# Patient Record
Sex: Male | Born: 1970 | Race: White | Hispanic: No | Marital: Married | Smoking: Former smoker
Health system: Southern US, Community
[De-identification: ages and names within clinical notes are randomized; demographics above are authoritative.]

## PROBLEM LIST (undated history)

## (undated) DIAGNOSIS — I10 Essential (primary) hypertension: Secondary | ICD-10-CM

## (undated) DIAGNOSIS — N209 Urinary calculus, unspecified: Secondary | ICD-10-CM

## (undated) DIAGNOSIS — D500 Iron deficiency anemia secondary to blood loss (chronic): Secondary | ICD-10-CM

## (undated) DIAGNOSIS — I2510 Atherosclerotic heart disease of native coronary artery without angina pectoris: Secondary | ICD-10-CM

## (undated) DIAGNOSIS — R030 Elevated blood-pressure reading, without diagnosis of hypertension: Secondary | ICD-10-CM

## (undated) DIAGNOSIS — K219 Gastro-esophageal reflux disease without esophagitis: Secondary | ICD-10-CM

## (undated) DIAGNOSIS — IMO0001 Reserved for inherently not codable concepts without codable children: Secondary | ICD-10-CM

## (undated) DIAGNOSIS — E782 Mixed hyperlipidemia: Secondary | ICD-10-CM

## (undated) HISTORY — DX: Atherosclerotic heart disease of native coronary artery without angina pectoris: I25.10

## (undated) HISTORY — DX: Iron deficiency anemia secondary to blood loss (chronic): D50.0

## (undated) HISTORY — DX: Elevated blood-pressure reading, without diagnosis of hypertension: R03.0

## (undated) HISTORY — DX: Reserved for inherently not codable concepts without codable children: IMO0001

## (undated) HISTORY — DX: Essential (primary) hypertension: I10

## (undated) HISTORY — DX: Urinary calculus, unspecified: N20.9

## (undated) HISTORY — DX: Gastro-esophageal reflux disease without esophagitis: K21.9

## (undated) HISTORY — DX: Mixed hyperlipidemia: E78.2

---

## 2007-02-11 ENCOUNTER — Encounter: Payer: Self-pay | Admitting: Internal Medicine

## 2007-02-11 DIAGNOSIS — K219 Gastro-esophageal reflux disease without esophagitis: Secondary | ICD-10-CM | POA: Insufficient documentation

## 2007-02-25 ENCOUNTER — Ambulatory Visit: Payer: Self-pay | Admitting: Internal Medicine

## 2007-02-25 DIAGNOSIS — E669 Obesity, unspecified: Secondary | ICD-10-CM | POA: Insufficient documentation

## 2007-02-25 DIAGNOSIS — I10 Essential (primary) hypertension: Secondary | ICD-10-CM | POA: Insufficient documentation

## 2007-02-25 LAB — CONVERTED CEMR LAB
Bilirubin Urine: NEGATIVE
Specific Gravity, Urine: 1.02
Urobilinogen, UA: 0.2
pH: 5

## 2007-02-26 LAB — CONVERTED CEMR LAB
Albumin: 4.4 g/dL (ref 3.5–5.2)
Albumin: 4.4 g/dL (ref 3.5–5.2)
Alkaline Phosphatase: 86 units/L (ref 39–117)
BUN: 11 mg/dL (ref 6–23)
Basophils Absolute: 0.1 10*3/uL (ref 0.0–0.1)
Basophils Absolute: 0.1 10*3/uL (ref 0.0–0.1)
Bilirubin, Direct: 0.1 mg/dL (ref 0.0–0.3)
Chloride: 100 meq/L (ref 96–112)
Cholesterol: 183 mg/dL (ref 0–200)
Cholesterol: 183 mg/dL (ref 0–200)
Direct LDL: 114.5 mg/dL
Direct LDL: 114.5 mg/dL
Eosinophils Absolute: 0.4 10*3/uL (ref 0.0–0.6)
GFR calc Af Amer: 140 mL/min
GFR calc Af Amer: 140 mL/min
GFR calc non Af Amer: 116 mL/min
Glucose, Bld: 278 mg/dL — ABNORMAL HIGH (ref 70–99)
HCT: 43.6 % (ref 39.0–52.0)
HDL: 29.2 mg/dL — ABNORMAL LOW (ref >39.0)
Hemoglobin: 15.4 g/dL (ref 13.0–17.0)
Hgb A1c MFr Bld: 11.4 % — ABNORMAL HIGH (ref 4.6–6.0)
Lymphocytes Relative: 33.5 % (ref 12.0–46.0)
Lymphocytes Relative: 33.5 % (ref 12.0–46.0)
MCHC: 35.2 g/dL (ref 30.0–36.0)
MCHC: 35.2 g/dL (ref 30.0–36.0)
MCV: 84.3 fL (ref 78.0–100.0)
MCV: 84.3 fL (ref 78.0–100.0)
Monocytes Absolute: 0.6 10*3/uL (ref 0.2–0.7)
Monocytes Absolute: 0.6 10*3/uL (ref 0.2–0.7)
Monocytes Relative: 7.4 % (ref 3.0–11.0)
Neutro Abs: 4.6 10*3/uL (ref 1.4–7.7)
Neutro Abs: 4.6 10*3/uL (ref 1.4–7.7)
Neutrophils Relative %: 54 % (ref 43.0–77.0)
Potassium: 4.2 meq/L (ref 3.5–5.1)
Potassium: 4.2 meq/L (ref 3.5–5.1)
RBC: 5.17 M/uL (ref 4.22–5.81)
Sodium: 138 meq/L (ref 135–145)
TSH: 2.06 microintl units/mL (ref 0.35–5.50)
TSH: 2.06 microintl units/mL (ref 0.35–5.50)
Total CHOL/HDL Ratio: 6.3
Total Protein: 7.6 g/dL (ref 6.0–8.3)

## 2007-03-03 ENCOUNTER — Encounter: Payer: Self-pay | Admitting: Internal Medicine

## 2007-03-23 ENCOUNTER — Ambulatory Visit: Payer: Self-pay | Admitting: Internal Medicine

## 2007-03-23 DIAGNOSIS — R740 Nonspecific elevation of levels of transaminase and lactic acid dehydrogenase [LDH]: Secondary | ICD-10-CM

## 2007-03-23 DIAGNOSIS — R7401 Elevation of levels of liver transaminase levels: Secondary | ICD-10-CM | POA: Insufficient documentation

## 2007-03-23 DIAGNOSIS — E119 Type 2 diabetes mellitus without complications: Secondary | ICD-10-CM | POA: Insufficient documentation

## 2007-03-23 DIAGNOSIS — E782 Mixed hyperlipidemia: Secondary | ICD-10-CM

## 2007-03-23 HISTORY — DX: Mixed hyperlipidemia: E78.2

## 2007-03-23 LAB — CONVERTED CEMR LAB: Blood Glucose, Fingerstick: 139

## 2007-03-30 LAB — CONVERTED CEMR LAB
ALT: 74 units/L — ABNORMAL HIGH (ref 0–53)
AST: 37 units/L (ref 0–37)
Alkaline Phosphatase: 60 units/L (ref 39–117)
Bilirubin, Direct: 0.1 mg/dL (ref 0.0–0.3)
Microalb Creat Ratio: 12.9 mg/g (ref 0.0–30.0)
Total Protein: 8.2 g/dL (ref 6.0–8.3)
Transferrin: 302.6 mg/dL (ref >=212.0)

## 2007-04-06 ENCOUNTER — Telehealth: Payer: Self-pay | Admitting: Internal Medicine

## 2007-04-12 ENCOUNTER — Telehealth: Payer: Self-pay | Admitting: Internal Medicine

## 2007-04-22 ENCOUNTER — Telehealth: Payer: Self-pay | Admitting: Internal Medicine

## 2007-05-25 ENCOUNTER — Ambulatory Visit: Payer: Self-pay | Admitting: Internal Medicine

## 2007-05-25 LAB — CONVERTED CEMR LAB
ALT: 36 units/L (ref 0–53)
Alkaline Phosphatase: 68 units/L (ref 39–117)
Bilirubin, Direct: 0.1 mg/dL (ref 0.0–0.3)
Cholesterol: 111 mg/dL (ref 0–200)
HDL: 25.8 mg/dL — ABNORMAL LOW (ref >39.0)
LDL Cholesterol: 68 mg/dL (ref 0–99)
Total Bilirubin: 0.6 mg/dL (ref 0.3–1.2)
Total CHOL/HDL Ratio: 4.3
Total Protein: 7.3 g/dL (ref 6.0–8.3)

## 2007-06-02 ENCOUNTER — Ambulatory Visit: Payer: Self-pay | Admitting: Internal Medicine

## 2007-06-02 LAB — CONVERTED CEMR LAB
Cholesterol, target level: 200 mg/dL
LDL Goal: 100 mg/dL

## 2007-06-10 ENCOUNTER — Ambulatory Visit: Payer: Self-pay | Admitting: Internal Medicine

## 2007-06-10 ENCOUNTER — Ambulatory Visit: Payer: Self-pay

## 2007-06-10 DIAGNOSIS — J019 Acute sinusitis, unspecified: Secondary | ICD-10-CM | POA: Insufficient documentation

## 2007-06-21 ENCOUNTER — Telehealth: Payer: Self-pay | Admitting: Internal Medicine

## 2007-06-21 DIAGNOSIS — I2510 Atherosclerotic heart disease of native coronary artery without angina pectoris: Secondary | ICD-10-CM

## 2007-06-21 HISTORY — DX: Atherosclerotic heart disease of native coronary artery without angina pectoris: I25.10

## 2007-06-28 ENCOUNTER — Encounter: Payer: Self-pay | Admitting: Internal Medicine

## 2007-06-28 ENCOUNTER — Ambulatory Visit: Payer: Self-pay

## 2007-06-29 ENCOUNTER — Telehealth: Payer: Self-pay | Admitting: Internal Medicine

## 2007-08-04 ENCOUNTER — Ambulatory Visit: Payer: Self-pay | Admitting: Internal Medicine

## 2007-08-04 DIAGNOSIS — R071 Chest pain on breathing: Secondary | ICD-10-CM | POA: Insufficient documentation

## 2007-08-04 LAB — CONVERTED CEMR LAB
Blood in Urine, dipstick: NEGATIVE
Glucose, Urine, Semiquant: NEGATIVE
Ketones, urine, test strip: NEGATIVE
Nitrite: NEGATIVE
Protein, U semiquant: NEGATIVE

## 2007-08-20 ENCOUNTER — Telehealth: Payer: Self-pay | Admitting: Internal Medicine

## 2007-10-11 ENCOUNTER — Ambulatory Visit: Payer: Self-pay | Admitting: Internal Medicine

## 2007-10-11 LAB — CONVERTED CEMR LAB: Hgb A1c MFr Bld: 6.5 % — ABNORMAL HIGH (ref 4.6–6.0)

## 2007-10-18 ENCOUNTER — Ambulatory Visit: Payer: Self-pay | Admitting: Internal Medicine

## 2007-10-18 DIAGNOSIS — M546 Pain in thoracic spine: Secondary | ICD-10-CM | POA: Insufficient documentation

## 2007-10-18 LAB — CONVERTED CEMR LAB: Blood Glucose, Fingerstick: 177

## 2008-02-10 ENCOUNTER — Ambulatory Visit: Payer: Self-pay | Admitting: Internal Medicine

## 2008-02-17 ENCOUNTER — Ambulatory Visit: Payer: Self-pay | Admitting: Internal Medicine

## 2008-03-07 ENCOUNTER — Telehealth: Payer: Self-pay | Admitting: *Deleted

## 2008-03-27 ENCOUNTER — Telehealth: Payer: Self-pay | Admitting: Internal Medicine

## 2008-04-21 ENCOUNTER — Ambulatory Visit: Payer: Self-pay | Admitting: Internal Medicine

## 2008-05-01 ENCOUNTER — Ambulatory Visit: Payer: Self-pay | Admitting: Internal Medicine

## 2008-05-24 ENCOUNTER — Telehealth: Payer: Self-pay | Admitting: *Deleted

## 2008-06-14 ENCOUNTER — Encounter: Payer: Self-pay | Admitting: Endocrinology

## 2008-06-17 ENCOUNTER — Ambulatory Visit: Payer: Self-pay | Admitting: Family Medicine

## 2008-06-17 DIAGNOSIS — J1089 Influenza due to other identified influenza virus with other manifestations: Secondary | ICD-10-CM | POA: Insufficient documentation

## 2008-06-17 LAB — CONVERTED CEMR LAB
Inflenza A Ag: NEGATIVE
Influenza B Ag: NEGATIVE

## 2008-08-02 ENCOUNTER — Encounter: Payer: Self-pay | Admitting: Endocrinology

## 2008-08-14 ENCOUNTER — Encounter: Payer: Self-pay | Admitting: Endocrinology

## 2008-12-01 ENCOUNTER — Encounter: Payer: Self-pay | Admitting: Endocrinology

## 2008-12-08 ENCOUNTER — Telehealth: Payer: Self-pay | Admitting: *Deleted

## 2009-02-05 ENCOUNTER — Ambulatory Visit: Payer: Self-pay | Admitting: Endocrinology

## 2009-02-05 ENCOUNTER — Telehealth: Payer: Self-pay | Admitting: Endocrinology

## 2009-02-05 DIAGNOSIS — N209 Urinary calculus, unspecified: Secondary | ICD-10-CM

## 2009-02-05 HISTORY — DX: Urinary calculus, unspecified: N20.9

## 2009-02-28 ENCOUNTER — Encounter: Payer: Self-pay | Admitting: Endocrinology

## 2009-03-08 ENCOUNTER — Ambulatory Visit: Payer: Self-pay | Admitting: Endocrinology

## 2009-03-09 ENCOUNTER — Telehealth (INDEPENDENT_AMBULATORY_CARE_PROVIDER_SITE_OTHER): Payer: Self-pay | Admitting: *Deleted

## 2009-03-10 LAB — CONVERTED CEMR LAB
Cholesterol: 128 mg/dL (ref 0–200)
Hgb A1c MFr Bld: 6.7 % — ABNORMAL HIGH (ref 4.6–6.5)

## 2009-03-23 ENCOUNTER — Ambulatory Visit: Payer: Self-pay | Admitting: Internal Medicine

## 2009-03-23 DIAGNOSIS — J039 Acute tonsillitis, unspecified: Secondary | ICD-10-CM | POA: Insufficient documentation

## 2009-03-23 LAB — CONVERTED CEMR LAB: Rapid Strep: NEGATIVE

## 2009-03-24 ENCOUNTER — Encounter: Payer: Self-pay | Admitting: Internal Medicine

## 2009-07-03 ENCOUNTER — Ambulatory Visit: Payer: Self-pay | Admitting: Endocrinology

## 2009-07-04 LAB — CONVERTED CEMR LAB: Hgb A1c MFr Bld: 6.7 % — ABNORMAL HIGH (ref 4.6–6.5)

## 2009-07-10 ENCOUNTER — Ambulatory Visit: Payer: Self-pay | Admitting: Endocrinology

## 2009-07-10 ENCOUNTER — Telehealth: Payer: Self-pay | Admitting: Endocrinology

## 2009-09-10 ENCOUNTER — Ambulatory Visit: Payer: Self-pay | Admitting: Endocrinology

## 2009-09-10 DIAGNOSIS — F911 Conduct disorder, childhood-onset type: Secondary | ICD-10-CM | POA: Insufficient documentation

## 2009-09-10 DIAGNOSIS — R635 Abnormal weight gain: Secondary | ICD-10-CM | POA: Insufficient documentation

## 2009-09-10 DIAGNOSIS — D500 Iron deficiency anemia secondary to blood loss (chronic): Secondary | ICD-10-CM

## 2009-09-10 HISTORY — DX: Iron deficiency anemia secondary to blood loss (chronic): D50.0

## 2009-09-11 LAB — CONVERTED CEMR LAB: TSH: 1.58 microintl units/mL (ref 0.35–5.50)

## 2009-09-25 ENCOUNTER — Ambulatory Visit: Payer: Self-pay | Admitting: Internal Medicine

## 2009-09-25 LAB — CONVERTED CEMR LAB
Bilirubin Urine: NEGATIVE
Glucose, Urine, Semiquant: NEGATIVE
Specific Gravity, Urine: <1.005
WBC Urine, dipstick: NEGATIVE
pH: 5.5

## 2010-01-08 ENCOUNTER — Ambulatory Visit: Payer: Self-pay | Admitting: Endocrinology

## 2010-01-15 ENCOUNTER — Ambulatory Visit: Payer: Self-pay | Admitting: Endocrinology

## 2010-05-22 ENCOUNTER — Ambulatory Visit: Payer: Self-pay | Admitting: Family Medicine

## 2010-07-02 ENCOUNTER — Ambulatory Visit: Payer: Self-pay | Admitting: Endocrinology

## 2010-07-02 LAB — CONVERTED CEMR LAB
BUN: 19 mg/dL (ref 6–23)
Calcium: 8.8 mg/dL (ref 8.4–10.5)
Chloride: 104 meq/L (ref 96–112)
Cholesterol: 144 mg/dL (ref 0–200)
Creatinine, Ser: 0.8 mg/dL (ref 0.4–1.5)
Creatinine,U: 193.5 mg/dL
HDL: 31.4 mg/dL — ABNORMAL LOW (ref >39.00)
LDL Cholesterol: 98 mg/dL (ref 0–99)
Triglycerides: 71 mg/dL (ref 0.0–149.0)
VLDL: 14.2 mg/dL (ref 0.0–40.0)

## 2010-07-09 ENCOUNTER — Ambulatory Visit: Payer: Self-pay | Admitting: Endocrinology

## 2010-07-09 DIAGNOSIS — R059 Cough, unspecified: Secondary | ICD-10-CM

## 2010-07-09 DIAGNOSIS — L509 Urticaria, unspecified: Secondary | ICD-10-CM | POA: Insufficient documentation

## 2010-07-09 DIAGNOSIS — R05 Cough: Secondary | ICD-10-CM

## 2010-07-09 HISTORY — DX: Cough, unspecified: R05.9

## 2010-08-20 ENCOUNTER — Telehealth (INDEPENDENT_AMBULATORY_CARE_PROVIDER_SITE_OTHER): Payer: Self-pay | Admitting: *Deleted

## 2010-08-22 ENCOUNTER — Encounter: Payer: Self-pay | Admitting: Endocrinology

## 2010-08-27 NOTE — Assessment & Plan Note (Signed)
Summary: ANGER ISSUE--MED????---STC   Vital Signs:  Patient profile:   40 year old male Height:      70 inches (177.80 cm) Weight:      255.38 pounds (116.08 kg) O2 Sat:      94 % on Room air Temp:     98.3 degrees F (36.83 degrees C) oral Pulse rate:   87 / minute BP sitting:   112 / 72  (left arm) Cuff size:   large  Vitals Entered By: Josph Macho RMA (September 10, 2009 2:03 PM)  O2 Flow:  Room air CC: Pts spouse is concerned with pts anger? pts spouse wanders if it is caused from meds?/  pt states he is no longer taking Cinnamon/CF Is Patient Diabetic? Yes   Referring Provider:  Dr Shelby Mattocks Primary Provider:  Dr Berniece Andreas  CC:  Pts spouse is concerned with pts anger? pts spouse wanders if it is caused from meds?/  pt states he is no longer taking Cinnamon/CF.  History of Present Illness: pt says he had an "anger explosion," over the weekend.  he says he has had "anger problems" since the age of 52.  he has never had a psychological evaluation.  wife says he has this from time to time, but more recently.  alcohol is < 2/month.  no illegal drugs.  no domestic violence.   he is discouraged by weight gain.  Allergies: No Known Drug Allergies  Past History:  Past Medical History: Last updated: 04/21/2008 GERD Elevated BP readings  Diabetes mellitus, type II Hypertension  Social History: Reviewed history from 04/21/2008 and no changes required. Married newborn no pets Former Smoker Alcohol use-yes  rare Firearms locked away.    does not work outside the home  Review of Systems       The patient complains of depression.         denies suicidal thoughts.    Physical Exam  General:  obese.  no distress  Psych:  Alert and cooperative; normal mood and affect; normal attention span and concentration.     Impression & Recommendations:  Problem # 1:  ANGER (ICD-312.00) Assessment New  Problem # 2:  WEIGHT GAIN (ICD-783.1) Assessment:  Deteriorated  Other Orders: Surgical Referral (Surgery) TLB-TSH (Thyroid Stimulating Hormone) (84443-TSH) Est. Patient Level III (95284)  Patient Instructions: 1)  check thyroid blood test today. 2)  please call if you wish to consider medication or psychological evaluation.   3)  you will be called re: an appointment for weight-loss surgery.

## 2010-08-27 NOTE — Assessment & Plan Note (Signed)
Summary: 6 mos f/u #/cd   Vital Signs:  Patient profile:   40 year old male Height:      69 inches (175.26 cm) Weight:      257.75 pounds (117.16 kg) BMI:     38.20 O2 Sat:      96 % on Room air Temp:     96.8 degrees F (36 degrees C) oral Pulse rate:   75 / minute Pulse rhythm:   regular BP sitting:   124 / 86  (left arm) Cuff size:   large  Vitals Entered By: Brenton Grills (January 15, 2010 8:26 AM)  O2 Flow:  Room air CC: pt here for 6 mo f/u/pt states he is taking losartan 50mg  1 tab BID/aj   Referring Provider:  Dr Shelby Mattocks Primary Provider:  Dr Berniece Andreas  CC:  pt here for 6 mo f/u/pt states he is taking losartan 50mg  1 tab BID/aj.  History of Present Illness: pt states he feels well in general, but he says his diet is poor.  no cbg record, but states cbg's are sometimes over 200.  Current Medications (verified): 1)  Onetouch Test   Strp (Glucose Blood) .... Check Blood Sugar 5 To 6 Times A Day or As Directed 2)  Adult Aspirin Ec Low Strength 81 Mg  Tbec (Aspirin) .... Once Daily 3)  Zyrtec-D Allergy & Congestion 5-120 Mg Xr12h-Tab (Cetirizine-Pseudoephedrine) .... As Needed 4)  Fish Oil 1000 Mg Caps (Omega-3 Fatty Acids) .Marland Kitchen.. 1 Tab Once A Day 5)  Actoplus Met 15-850 Mg Tabs (Pioglitazone Hcl-Metformin Hcl) .Marland Kitchen.. 1 Twice A Day 6)  Cozaar 50 Mg Tabs (Losartan Potassium) .Marland Kitchen.. 1 Bid 7)  Cinnamon 2000mg  .... 1tab in Am and 1tab in Pm  Allergies (verified): No Known Drug Allergies  Past History:  Past Medical History: Last updated: 04/21/2008 GERD Elevated BP readings  Diabetes mellitus, type II Hypertension  Social History: Reviewed history from 09/10/2009 and no changes required. Married newborn no pets Former Smoker Alcohol use-yes  rare Firearms locked away.    does not work outside the home  Review of Systems       he has gained a few lbs  Physical Exam  General:  obese.  no distress  Pulses:  dorsalis pedis intact bilat.    Extremities:   no deformity.  no ulcer on the feet.  feet are of normal color and temp.  no edema  Neurologic:  cn 2-12 grossly intact.   readily moves all 4's.   sensation is intact to touch on the feet.  Additional Exam:  Hemoglobin A1C       [H]  6.8 %   Impression & Recommendations:  Problem # 1:  DIABETES MELLITUS, TYPE II (ICD-250.00) Assessment Deteriorated  Medications Added to Medication List This Visit: 1)  Actoplus Met 15-850 Mg Tabs (Pioglitazone hcl-metformin hcl) .Marland Kitchen.. 1 tab three times a day 2)  Cozaar 50 Mg Tabs (Losartan potassium) .Marland Kitchen.. 1 two times a day  Other Orders: Est. Patient Level III ([address])  Patient Instructions: 1)  increase actos + met to 1 tab three times a day 2)  Please schedule a follow-up appointment in 6 months, with these labs prior (all 250.00):  a1c microalb, tsh, lipids, and bmet. Prescriptions: ACTOPLUS MET 15-850 MG TABS (PIOGLITAZONE HCL-METFORMIN HCL) 1 tab three times a day  #90 x 11   Entered and Authorized by:   Minus Breeding MD   Signed by:   Minus Breeding MD on  01/15/2010   Method used:   Electronically to        [facility] [id number] (retail)       [address]       [place], [place]  [address]       Ph: [phone]       Fax: [phone]   RxID:   [id number]

## 2010-08-27 NOTE — Assessment & Plan Note (Signed)
Summary: P PT REQ ANOTHER DOCTOR TODAY/SINUS/PS   Vital Signs:  Patient profile:   40 year old male Weight:      258 pounds Temp:     97.7 degrees F oral BP sitting:   130 / 90  (left arm) Cuff size:   large  Vitals Entered By: Sid Falcon LPN (May 22, 2010 3:52 PM)  History of Present Illness: Acute visit.  10 day history of sinus congestion and progressive right facial pressure maxillary and frontal sinus region. Pain radiating to right ear. Used saline nasal irrigation without improvement. Recently increased yellowish nasal discharge. Occasional dry cough. Mild sore throat. History frequent sinus infections in past  Allergies (verified): No Known Drug Allergies  Past History:  Past Medical History: Last updated: 04/21/2008 GERD Elevated BP readings  Diabetes mellitus, type II Hypertension PMH reviewed for relevance  Review of Systems      See HPI  Physical Exam  General:  Well-developed,well-nourished,in no acute distress; alert,appropriate and cooperative throughout examination Head:  tender right maxillary and frontal sinus region. No visible swelling Ears:  External ear exam shows no significant lesions or deformities.  Otoscopic examination reveals clear canals, tympanic membranes are intact bilaterally without bulging, retraction, inflammation or discharge. Hearing is grossly normal bilaterally. Nose:  swelling right naris. Mostly clear nasal discharge Mouth:  Oral mucosa and oropharynx without lesions or exudates.  Teeth in good repair. Neck:  No deformities, masses, or tenderness noted. Lungs:  Normal respiratory effort, chest expands symmetrically. Lungs are clear to auscultation, no crackles or wheezes. Heart:  Normal rate and regular rhythm. S1 and S2 normal without gallop, murmur, click, rub or other extra sounds.   Impression & Recommendations:  Problem # 1:  SINUSITIS- ACUTE-NOS (ICD-461.9)  His updated medication list for this problem includes:    Zyrtec-d Allergy & Congestion 5-120 Mg Xr12h-tab (Cetirizine-pseudoephedrine) .Marland Kitchen... As needed    Amoxicillin-pot Clavulanate 875-125 Mg Tabs (Amoxicillin-pot clavulanate) ..... One by mouth two times a day for 10 days  Complete Medication List: 1)  Onetouch Test Strp (Glucose blood) .... Check blood sugar 5 to 6 times a day or as directed 2)  Adult Aspirin Ec Low Strength 81 Mg Tbec (Aspirin) .... Once daily 3)  Zyrtec-d Allergy & Congestion 5-120 Mg Xr12h-tab (Cetirizine-pseudoephedrine) .... As needed 4)  Fish Oil 1000 Mg Caps (Omega-3 fatty acids) .Marland Kitchen.. 1 tab once a day 5)  Actoplus Met 15-850 Mg Tabs (Pioglitazone hcl-metformin hcl) .Marland Kitchen.. 1 tab three times a day 6)  Cozaar 50 Mg Tabs (Losartan potassium) .Marland Kitchen.. 1 two times a day 7)  Cinnamon 2000mg   .... 1tab in am and 1tab in pm 8)  Amoxicillin-pot Clavulanate 875-125 Mg Tabs (Amoxicillin-pot clavulanate) .... One by mouth two times a day for 10 days  Other Orders: Admin 1st Vaccine (78469) Flu Vaccine 104yrs + (62952)  Patient Instructions: 1)  Acute sinusitis symptoms for less than 10 days are not helped by antibiotics. Use warm moist compresses, and over the counter decongestants( only as directed). Call if no improvement in 5-7 days, sooner if increasing pain, fever, or new symptoms.  Prescriptions: AMOXICILLIN-POT CLAVULANATE 875-125 MG TABS (AMOXICILLIN-POT CLAVULANATE) one by mouth two times a day for 10 days  #20 x 0   Entered and Authorized by:   Evelena Peat MD   Signed by:   Evelena Peat MD on 05/22/2010   Method used:   Electronically to        [id number] (retail)  [address] [place], [place]  [address]       Ph: [phone]       Fax: [phone]   RxID:   [id number]    Orders Added: 1)  Est. Patient Level III [84132] 2)  Admin 1st Vaccine [90471] 3)  Flu Vaccine 30yrs + [44010]   Flu Vaccine Consent Questions     Do you have a history of severe allergic  reactions to this vaccine? no    Any prior history of allergic reactions to egg and/or gelatin? no    Do you have a sensitivity to the preservative Thimersol? no    Do you have a past history of Guillan-Barre Syndrome? no    Do you currently have an acute febrile illness? no    Have you ever had a severe reaction to latex? no    Vaccine information given and explained to patient? yes    Are you currently pregnant? no    Lot Number:[id number]   Exp Date:01/25/2011   Site Given  Left Deltoid [email or web address]

## 2010-08-27 NOTE — Assessment & Plan Note (Signed)
Summary: lower abd pain/njr   Vital Signs:  Patient profile:   40 year old male Height:      69 inches Weight:      258 pounds Temp:     97.7 degrees F oral Pulse rate:   60 / minute BP sitting:   120 / 80  (right arm) Cuff size:   large  Vitals Entered By: Romualdo Bolk, CMA (AAMA) (September 25, 2009 9:42 AM) CC: LLQ Pain and feels like he has to have a BM constantly. This started on Sat or sun. Pt pulled the old hot water heater out on Sat and put it a new one in on Sun. Pt is unsure it this has something to do with it. No fever.   History of Present Illness: Alfred Johnson comesin for sda    replaced  hot water heater  around onset.  insidious onset    felt ? if contipated   Some increase in gas.  hurts 5/10/ no meds  no change in bowel habit Gu signs and not really worse with straining .  No fever or anorexia  vomiting or nausea. No hematuria and no hx of renal stone.   ? if could be a pulled muscle.   Dm  sees Dr Everardo All consider ing bariatric evaluation.  Preventive Screening-Counseling & Management  Alcohol-Tobacco     Alcohol drinks/day: <1     Alcohol type: Beer     Smoking Status: quit     Year Quit: 14 years ago  Caffeine-Diet-Exercise     Caffeine use/day: 1     Does Patient Exercise: no  Current Medications (verified): 1)  Onetouch Test   Strp (Glucose Blood) .... Check Blood Sugar 5 To 6 Times A Day or As Directed 2)  Adult Aspirin Ec Low Strength 81 Mg  Tbec (Aspirin) .... Once Daily 3)  Zyrtec-D Allergy & Congestion 5-120 Mg Xr12h-Tab (Cetirizine-Pseudoephedrine) .... As Needed 4)  Fish Oil 1000 Mg Caps (Omega-3 Fatty Acids) .Marland Kitchen.. 1 Tab Once A Day 5)  Actoplus Met 15-850 Mg Tabs (Pioglitazone Hcl-Metformin Hcl) .Marland Kitchen.. 1 Twice A Day 6)  Cozaar 50 Mg Tabs (Losartan Potassium) .Marland Kitchen.. 1 Bid 7)  Cinnamon 2000mg  .... 1tab in Am and 1tab in Pm  Allergies (verified): No Known Drug Allergies  Past History:  Past medical, surgical, family and social histories  (including risk factors) reviewed, and no changes noted (except as noted below).  Past Medical History: Reviewed history from 04/21/2008 and no changes required. GERD Elevated BP readings  Diabetes mellitus, type II Hypertension  Past Surgical History: Reviewed history from 02/11/2007 and no changes required. Denies surgical history  Past History:  Care Management: Endocrinology: Everardo All  Family History: Reviewed history from 02/17/2008 and no changes required. Family History of CAD Male 1st degree relative <50 Family History Diabetes 1st degree relative bro HIV related Family History Hypertension  See data base    Social History: Reviewed history from 09/10/2009 and no changes required. Married newborn no pets Former Smoker Alcohol use-yes  rare Firearms locked away.    does not work outside the home  Review of Systems  The patient denies anorexia, fever, weight loss, weight gain, vision loss, decreased hearing, chest pain, syncope, prolonged cough, melena, hematochezia, severe indigestion/heartburn, hematuria, incontinence, genital sores, abnormal bleeding, enlarged lymph nodes, and angioedema.    Physical Exam  General:  Well-developed,well-nourished,in no acute distress; alert,appropriate and cooperative throughout examination Head:  normocephalic and atraumatic.   Eyes:  vision grossly  intact.   Neck:  No deformities, masses, or tenderness noted. Lungs:  normal respiratory effort and no intercostal retractions.   Heart:  Normal rate and regular rhythm. S1 and S2 normal without gallop, murmur, click, rub or other extra sounds. Abdomen:  soft, normal bowel sounds, no distention, no masses, no guarding, no rigidity, no rebound tenderness, no abdominal hernia, no hepatomegaly, and no splenomegaly.  points to mid lower lateral left area as tender area . examined supine and upright  Genitalia:  nl ext gu no hernia noted  Extremities:  no clubbing cyanosis or edema    Neurologic:  gorssly non focal  Skin:  turgor normal, color normal, no ecchymoses, and no petechiae.   Cervical Nodes:  No lymphadenopathy noted Inguinal Nodes:  No significant adenopathy Psych:  Oriented X3, good eye contact, and not depressed appearing.     Impression & Recommendations:  Problem # 1:  ABDOMINAL PAIN LEFT (ICD-789.09)  unclear cause  unimpressive exam and no associated signs except ocurred after lifting.     disc alarm signs and follow up if persistent and progressive  over the next 2 weeks or if fever etc.     can take tylenol  nsaid with care    because of dm dx.   doesnt seem like renal stone pain and radiation  .   No imaging indicated at this time.  His updated medication list for this problem includes:    Adult Aspirin Ec Low Strength 81 Mg Tbec (Aspirin) ..... Once daily  Orders: UA Dipstick w/o Micro (automated)  (81003)  Problem # 2:  DIABETES MELLITUS, TYPE II (ICD-250.00) per dr Bea Laura.  His updated medication list for this problem includes:    Adult Aspirin Ec Low Strength 81 Mg Tbec (Aspirin) ..... Once daily    Actoplus Met 15-850 Mg Tabs (Pioglitazone hcl-metformin hcl) .Marland Kitchen... 1 twice a day    Cozaar 50 Mg Tabs (Losartan potassium) .Marland Kitchen... 1 bid  Problem # 3:  WEIGHT GAIN (ICD-783.1) disc   Complete Medication List: 1)  Onetouch Test Strp (Glucose blood) .... Check blood sugar 5 to 6 times a day or as directed 2)  Adult Aspirin Ec Low Strength 81 Mg Tbec (Aspirin) .... Once daily 3)  Zyrtec-d Allergy & Congestion 5-120 Mg Xr12h-tab (Cetirizine-pseudoephedrine) .... As needed 4)  Fish Oil 1000 Mg Caps (Omega-3 fatty acids) .Marland Kitchen.. 1 tab once a day 5)  Actoplus Met 15-850 Mg Tabs (Pioglitazone hcl-metformin hcl) .Marland Kitchen.. 1 twice a day 6)  Cozaar 50 Mg Tabs (Losartan potassium) .Marland Kitchen.. 1 bid 7)  Cinnamon 2000mg   .... 1tab in am and 1tab in pm  Laboratory Results   Urine Tests  Date/Time Received: September 25, 2009 10:27 AM   Routine Urinalysis   Color:  yellow Appearance: Clear Glucose: negative   (Normal Range: Negative) Bilirubin: negative   (Normal Range: Negative) Ketone: negative   (Normal Range: Negative) Spec. Gravity: <1.005   (Normal Range: 1.003-1.035) Blood: negative   (Normal Range: Negative) pH: 5.5   (Normal Range: 5.0-8.0) Protein: negative   (Normal Range: Negative) Urobilinogen: 0.2   (Normal Range: 0-1) Nitrite: negative   (Normal Range: Negative) Leukocyte Esterace: negative   (Normal Range: Negative)    Comments: Romualdo Bolk, CMA (AAMA)  September 25, 2009 10:27 AM

## 2010-08-29 NOTE — Assessment & Plan Note (Signed)
Summary: 6 mth fu--stc   Vital Signs:  Patient profile:   40 year old male Height:      69 inches (175.26 cm) Weight:      261.25 pounds (118.75 kg) BMI:     38.72 O2 Sat:      95 % on Room air Temp:     98.8 degrees F (37.11 degrees C) oral Pulse rate:   96 / minute BP sitting:   118 / 70  (left arm) Cuff size:   large  Vitals Entered By: Brenton Grills CMA Duncan Dull) (July 09, 2010 9:44 AM)  O2 Flow:  Room air CC: 6 month F/U/discuss Losartan/aj Is Patient Diabetic? Yes   Referring Provider:  Dr Shelby Mattocks Primary Provider:  Dr Berniece Andreas  CC:  6 month F/U/discuss Losartan/aj.  History of Present Illness: pt was worried that a1c would be higher, because diet has not been good recently. pt states 5 mos of slight dry-quality cough in the chest, but no assoc sob.  he has had heartburn in the past.     Current Medications (verified): 1)  Onetouch Test   Strp (Glucose Blood) .... Check Blood Sugar 5 To 6 Times A Day or As Directed 2)  Adult Aspirin Ec Low Strength 81 Mg  Tbec (Aspirin) .... Once Daily 3)  Zyrtec-D Allergy & Congestion 5-120 Mg Xr12h-Tab (Cetirizine-Pseudoephedrine) .Marland Kitchen.. 1 By Mouth Two Times A Day 4)  Fish Oil 1000 Mg Caps (Omega-3 Fatty Acids) .Marland Kitchen.. 1 Tab Once A Day 5)  Actoplus Met 15-850 Mg Tabs (Pioglitazone Hcl-Metformin Hcl) .Marland Kitchen.. 1 Tab Three Times A Day 6)  Cozaar 50 Mg Tabs (Losartan Potassium) .Marland Kitchen.. 1 Two Times A Day 7)  Cinnamon 2000mg  .... 1tab in Am and 1tab in Pm 8)  Tagamet Hb 200 Mg Tabs (Cimetidine) .... 2 Tablets By Mouth Once Daily 9)  Hydroxyzine Hcl 10 Mg Tabs (Hydroxyzine Hcl) .Marland Kitchen.. 1 Tablet By Mouth Three Times A Day  Allergies (verified): No Known Drug Allergies  Past History:  Past Medical History: Last updated: 04/21/2008 GERD Elevated BP readings  Diabetes mellitus, type II Hypertension  Review of Systems  The patient denies weight loss and weight gain.    Physical Exam  General:  normal appearance.   Head:  head: no  deformity eyes: no periorbital swelling, no proptosis external nose and ears are normal mouth: no lesion seen Ears:  TM's intact and clear with normal canals with grossly normal hearing.   Neck:  Supple without thyroid enlargement or tenderness. No cervical lymphadenopathy, neck masses or tracheal deviation.  Lungs:  Clear to auscultation bilaterally. Normal respiratory effort.  Additional Exam:  CHEST - 2 VIEW There is no evidence of acute cardiac or pulmonary process.   Impression & Recommendations:  Problem # 1:  DIABETES MELLITUS, TYPE II (ICD-250.00) well-controlled HgbA1C: 6.8 (07/02/2010)  Problem # 2:  COUGH (ICD-786.2) Assessment: New prob due to uri  Medications Added to Medication List This Visit: 1)  Zyrtec-d Allergy & Congestion 5-120 Mg Xr12h-tab (Cetirizine-pseudoephedrine) .Marland Kitchen.. 1 by mouth two times a day 2)  Tagamet Hb 200 Mg Tabs (Cimetidine) .... 2 tablets by mouth once daily 3)  Hydroxyzine Hcl 10 Mg Tabs (Hydroxyzine hcl) .Marland Kitchen.. 1 tablet by mouth three times a day 4)  Januvia 100 Mg Tabs (Sitagliptin phosphate) .Marland Kitchen.. 1 tab each am  Other Orders: T-2 View CXR (71020TC) Est. Patient Level IV ([address])  Patient Instructions: 1)  Please schedule a follow-up appointment in 6 months, with labs prior (  250.00):  a1c 2)  try taking these samples of "prilosec" 20 mg once daily.  call dr Fabian Sharp if this does not help).  3)  add januvia 100 mg once daily Prescriptions: JANUVIA 100 MG TABS (SITAGLIPTIN PHOSPHATE) 1 tab each am  #30 x 11   Entered and Authorized by:   Minus Breeding MD   Signed by:   Minus Breeding MD on 07/09/2010   Method used:   Electronically to        [facility] # [phone]* (retail)       [address]       [place], [place]  [address]       Ph: [phone]       Fax: [phone]   RxID:   [id number]    Orders Added: 1)  T-2 View CXR [71020TC] 2)  Est. Patient Level IV [95638]

## 2010-08-29 NOTE — Progress Notes (Signed)
Summary: PA-Januvia  Phone Note From Pharmacy   Summary of Call: Konrad Saha, called [employer] @ [phone], awaiting form. Dagoberto Reef  August 20, 2010 2:53 PM Form to Dr Everardo All to complete. Dagoberto Reef  August 20, 2010 2:59 PM Pa faxed to [facility] @ [phone], awaiting approval. Dagoberto Reef  August 22, 2010 11:58 AM PA approved 08/22/10 - 08/22/11, pt aware. Initial call taken by: Dagoberto Reef,  August 22, 2010 2:53 PM

## 2010-09-04 NOTE — Medication Information (Signed)
Summary: Approved/[facility]  Approved/[facility] [facility]   Imported By: Lester  08/28/2010 10:40:29  _____________________________________________________________________  External Attachment:    Type:   Image     Comment:   External Document

## 2010-09-16 ENCOUNTER — Ambulatory Visit (INDEPENDENT_AMBULATORY_CARE_PROVIDER_SITE_OTHER): Payer: Medicare PPO | Admitting: Internal Medicine

## 2010-09-16 ENCOUNTER — Encounter: Payer: Self-pay | Admitting: Internal Medicine

## 2010-09-16 VITALS — BP 130/80 | HR 107 | Temp 98.5°F | Wt 260.0 lb

## 2010-09-16 DIAGNOSIS — E119 Type 2 diabetes mellitus without complications: Secondary | ICD-10-CM

## 2010-09-16 DIAGNOSIS — J988 Other specified respiratory disorders: Secondary | ICD-10-CM

## 2010-09-16 DIAGNOSIS — R05 Cough: Secondary | ICD-10-CM

## 2010-09-16 DIAGNOSIS — R059 Cough, unspecified: Secondary | ICD-10-CM

## 2010-09-16 DIAGNOSIS — R0602 Shortness of breath: Secondary | ICD-10-CM

## 2010-09-16 MED ORDER — AZITHROMYCIN 250 MG PO TABS: ORAL_TABLET | ORAL | Status: DC

## 2010-09-16 MED ORDER — ALBUTEROL SULFATE (2.5 MG/3ML) 0.083% IN NEBU
2.5000 mg | INHALATION_SOLUTION | RESPIRATORY_TRACT | Status: AC
  Administered 2010-09-16: 2.5 mg via RESPIRATORY_TRACT

## 2010-09-16 NOTE — Progress Notes (Signed)
  Subjective:    Patient ID: Alfred Johnson, male    DOB: February 16, 1971, 40 y.o.   MRN: [id number]  HPI comesin for acute visit for above problems .  Has had a problem over the last week with increasing cough upper respiratory congestion like a cold however now has mid parasternalChest pain   With cough   And deep breath.     Now sob .  No hx of asthma.    No fever cough is generally dry.  Chronic cough since summer  But not like this and had been rx with prilosec  Per Dr. Everardo All.   No hx of asthma otherwise.  ? Hx of sob and cough after x mas with exercise.    Review of Systems  no fever new GI symptoms bleeding swollen glands or other chest pain on exertion. Blood pressure  Has been controlled.  Sees endocrinology for his diabetes. No ETS Past Medical History  Diagnosis Date  . GERD (gastroesophageal reflux disease)   . Hypertension   . Elevated BP   . Diabetes mellitus    History reviewed. No pertinent past surgical history.  reports that he has quit smoking. He quit smokeless tobacco use about 18 years ago. He reports that he does not drink alcohol or use illicit drugs. family history includes Diabetes in his brother; Luiz Blare' disease in his mother; HIV in his brother; Heart failure in his father; and Kidney disease in his father.        Objective:   Physical Exam WDWN in nad  but coughing and wheezy at times  Nl color  And perfusion he is nontoxic HEENT: Normocephalic ;atraumatic , Eyes;  PERRL, EOMs  Full, lids and conjunctiva clear,,Ears: no deformities, canals nl, TM landmarks normal, Nose: no deformity   Minimal congestion Mouth : OP clear without lesion or edema . Neck supple neg jvd  Or nodes  Chest BS slight decrease  Bilateral exp wheezes and rhonchi .   No rales  CW  Tender para sternal area Abd soft no Organomegaly bs ok  No clubbing cyanosis or edema   neuro:   nonfocal   after nebulizer treatment with albuterol he has increased breath sounds but still some  expiratory wheezes     Assessment & Plan:TI  Acute rti SOB  Wheezy bronchitis    Acute  Probably viral respiratory tract infection on top of chronic cough. SOB  Poss wheezy bronchitis Hx of chronic cough Chest  rx by dr Everardo All  From December are reviewed and normal Rx as if from  poss reflux.  Although this sounds more like cough variant asthma. Cp seems like cwp  DM  A bit worse control since  Ill    treatment options discussed including short course prednisone and antibiotic in case this is an atypical bacteria with exposure. No signs of pneumonia are present samples given of  Proventil inhaler he can use as needed. Then he should follow up about his chronic cough consider further evaluation and control medication.

## 2010-09-18 ENCOUNTER — Encounter: Payer: Self-pay | Admitting: Internal Medicine

## 2010-09-18 MED ORDER — ALBUTEROL SULFATE HFA 108 (90 BASE) MCG/ACT IN AERS: 2.0000 | INHALATION_SPRAY | Freq: Four times a day (QID) | RESPIRATORY_TRACT | Status: AC | PRN

## 2010-09-18 NOTE — Assessment & Plan Note (Signed)
Apparently ongoing and had a normal chest x-ray under treatment for reflux silent without significant improvement. He appears to have exercise-induced cough by history and today has exacerbation with upper respiratory infection. I suspect that the asthmatic variant no evidence of cardiovascular involvement. Discussed this with patient and appropriate followup.

## 2010-12-25 ENCOUNTER — Other Ambulatory Visit: Payer: Self-pay | Admitting: Endocrinology

## 2011-01-06 ENCOUNTER — Encounter: Payer: Self-pay | Admitting: *Deleted

## 2011-01-07 ENCOUNTER — Other Ambulatory Visit (INDEPENDENT_AMBULATORY_CARE_PROVIDER_SITE_OTHER): Payer: Medicare PPO

## 2011-01-07 ENCOUNTER — Other Ambulatory Visit: Payer: Self-pay

## 2011-01-07 ENCOUNTER — Other Ambulatory Visit: Payer: Self-pay | Admitting: Endocrinology

## 2011-01-07 DIAGNOSIS — E119 Type 2 diabetes mellitus without complications: Secondary | ICD-10-CM

## 2011-01-14 ENCOUNTER — Ambulatory Visit: Payer: Self-pay | Admitting: Endocrinology

## 2011-01-21 ENCOUNTER — Encounter: Payer: Self-pay | Admitting: Endocrinology

## 2011-01-21 ENCOUNTER — Ambulatory Visit (INDEPENDENT_AMBULATORY_CARE_PROVIDER_SITE_OTHER): Payer: Medicare PPO | Admitting: Endocrinology

## 2011-01-21 VITALS — BP 126/84 | HR 83 | Temp 97.3°F | Ht 70.0 in | Wt 264.0 lb

## 2011-01-21 DIAGNOSIS — E119 Type 2 diabetes mellitus without complications: Secondary | ICD-10-CM

## 2011-01-21 NOTE — Progress Notes (Signed)
Subjective:    Patient ID: Alfred Johnson, male    DOB: 1970-08-15, 40 y.o.   MRN: [id number]  HPI pt states he feels well in general, but his diet is poor.  no cbg record, but states cbg's are almost all well-controlled. Past Medical History  Diagnosis Date  . GERD (gastroesophageal reflux disease)   . Hypertension   . Elevated BP   . Diabetes mellitus   . KIDNEY STONE 02/05/2009  . ANEMIA DUE TO CHRONIC BLOOD LOSS 09/10/2009  . HYPERLIPIDEMIA, MIXED 03/23/2007  . VENTRICULAR FUNCTION, DECREASED 06/21/2007    No past surgical history on file.  History   Social History  . Marital Status: Married    Spouse Name: N/A    Number of Children: N/A  . Years of Education: N/A   Occupational History  .      Does not work outside the home   Social History Main Topics  . Smoking status: Former Games developer  . Smokeless tobacco: Former Neurosurgeon    Quit date: 07/28/1992  . Alcohol Use: No  . Drug Use: No  . Sexually Active: Not on file   Other Topics Concern  . Not on file   Social History Narrative   HH of 3No petsFirearms locked awayDoes not work outside the home    Current Outpatient Prescriptions on File Prior to Visit  Medication Sig Dispense Refill  . aspirin 81 MG tablet Take 81 mg by mouth daily.        . cetirizine-pseudoephedrine (ZYRTEC-D) 5-120 MG per tablet Take 1 tablet by mouth 2 (two) times daily.        Marland Kitchen CINNAMON PO Take by mouth. 2000mg   2 tablets in the evening      . glucose blood (ONE TOUCH ULTRA TEST) test strip Check blood sugar 5 to 6 times a day or as directed       . losartan (COZAAR) 50 MG tablet TAKE ONE TABLET BY MOUTH TWICE DAILY  60 tablet  3  . pioglitazone-metformin (ACTOPLUS MET) 15-850 MG per tablet Take 1 tablet by mouth 3 (three) times daily.        . sitaGLIPtan (JANUVIA) 100 MG tablet Take 100 mg by mouth daily.        Marland Kitchen albuterol (PROVENTIL HFA;VENTOLIN HFA) 108 (90 BASE) MCG/ACT inhaler Inhale 2 puffs into the lungs every 6 (six) hours as  needed for Wheezing.  1 Inhaler  2  . Omega-3 Fatty Acids (FISH OIL) 1000 MG CAPS Take 1 capsule by mouth daily.       Marland Kitchen DISCONTD: azithromycin (ZITHROMAX Z-PAK) 250 MG tablet Take 2 po day one then 1 po qd  6 each  0  . DISCONTD: cimetidine (TAGAMET HB) 200 MG tablet 2 tablets by mouth once daily       . DISCONTD: hydrOXYzine (ATARAX) 10 MG tablet Take 10 mg by mouth 3 (three) times daily as needed.          No Known Allergies  Family History  Problem Relation Age of Onset  . Graves' disease Mother   . Kidney disease Father   . Heart failure Father   . Diabetes Brother   . HIV Brother     BP 126/84  Pulse 83  Temp(Src) 97.3 F (36.3 C) (Oral)  Ht 5\' 10"  (1.778 m)  Wt 264 lb (119.75 kg)  BMI 37.88 kg/m2  SpO2 95%  Review of Systems denies hypoglycemia.      Objective:   Physical Exam  GENERAL: no distress.  Obese Pulses: dorsalis pedis intact bilat.   Feet: no deformity.  no ulcer on the feet.  feet are of normal color and temp.  no edema Neuro: sensation is intact to touch on the feet     Lab Results  Component Value Date   HGBA1C 6.7* 01/07/2011   Assessment & Plan:  Dm, he needs increased rx, if it can be done with a regimen that avoids or minimizes hypoglycemia.

## 2011-01-21 NOTE — Patient Instructions (Addendum)
good diet and exercise habits significanly improve the control of your diabetes.  please let me know if you wish to be referred to a dietician.  high blood sugar is very risky to your health.  you should see an eye doctor every year. controlling your blood pressure and cholesterol drastically reduces the damage diabetes does to your body.  this also applies to quitting smoking.  please discuss these with your doctor.  you should take an aspirin every day, unless you have been advised by a doctor not to. Same meds for now Please make a follow-up appointment in 6 months, with a1c prior. Here are some ways you could further lower your a1c: add bromocriptine, add acarbose, or change januvia to "victoza."

## 2011-01-24 ENCOUNTER — Other Ambulatory Visit: Payer: Self-pay | Admitting: Endocrinology

## 2011-04-03 ENCOUNTER — Telehealth: Payer: Self-pay

## 2011-04-03 NOTE — Telephone Encounter (Signed)
Pt called stating that since Actos has gone generic is co pay for Actosplusmet has increased significantly. Pt is requesting to D/C this medication and change to a cheaper alternative. Pt states that his blood sugars have been well controlled and he has been exercising daily.

## 2011-04-03 NOTE — Telephone Encounter (Signed)
Stop actos Ov within the next few weeks.  We'll address alternatives

## 2011-04-04 NOTE — Telephone Encounter (Signed)
Pt informed and expressed understanding. 

## 2011-05-21 ENCOUNTER — Other Ambulatory Visit (INDEPENDENT_AMBULATORY_CARE_PROVIDER_SITE_OTHER): Payer: Medicare PPO

## 2011-05-21 DIAGNOSIS — E119 Type 2 diabetes mellitus without complications: Secondary | ICD-10-CM

## 2011-05-21 DIAGNOSIS — Z0000 Encounter for general adult medical examination without abnormal findings: Secondary | ICD-10-CM

## 2011-05-21 LAB — CBC WITH DIFFERENTIAL/PLATELET
Basophils Relative: 0.4 % (ref 0.0–3.0)
Eosinophils Absolute: 0.8 10*3/uL — ABNORMAL HIGH (ref 0.0–0.7)
HCT: 41.1 % (ref 39.0–52.0)
Hemoglobin: 13.7 g/dL (ref 13.0–17.0)
MCHC: 33.4 g/dL (ref 30.0–36.0)
MCV: 90 fl (ref 78.0–100.0)
Monocytes Absolute: 0.8 10*3/uL (ref 0.1–1.0)
Neutro Abs: 4.1 10*3/uL (ref 1.4–7.7)
RBC: 4.56 Mil/uL (ref 4.22–5.81)

## 2011-05-21 LAB — POCT URINALYSIS DIPSTICK
Bilirubin, UA: NEGATIVE
Glucose, UA: NEGATIVE
Ketones, UA: NEGATIVE
Leukocytes, UA: NEGATIVE
pH, UA: 5.5

## 2011-05-21 LAB — HEPATIC FUNCTION PANEL
Albumin: 4.2 g/dL (ref 3.5–5.2)
Alkaline Phosphatase: 53 U/L (ref 39–117)
Total Protein: 7 g/dL (ref 6.0–8.3)

## 2011-05-21 LAB — HEMOGLOBIN A1C: Hgb A1c MFr Bld: 6.6 % — ABNORMAL HIGH (ref 4.6–6.5)

## 2011-05-21 LAB — BASIC METABOLIC PANEL
CO2: 26 mEq/L (ref 19–32)
Chloride: 107 mEq/L (ref 96–112)
Sodium: 141 mEq/L (ref 135–145)

## 2011-05-21 LAB — MICROALBUMIN / CREATININE URINE RATIO
Creatinine,U: 182.9 mg/dL
Microalb, Ur: 0.7 mg/dL (ref 0.0–1.9)

## 2011-05-21 LAB — LIPID PANEL
HDL: 40.7 mg/dL (ref >39.00)
Triglycerides: 60 mg/dL (ref 0.0–149.0)

## 2011-05-27 ENCOUNTER — Other Ambulatory Visit: Payer: Self-pay | Admitting: Endocrinology

## 2011-05-27 NOTE — Telephone Encounter (Signed)
Done

## 2011-05-28 ENCOUNTER — Ambulatory Visit (INDEPENDENT_AMBULATORY_CARE_PROVIDER_SITE_OTHER): Payer: Medicare PPO | Admitting: Internal Medicine

## 2011-05-28 ENCOUNTER — Encounter: Payer: Self-pay | Admitting: Internal Medicine

## 2011-05-28 VITALS — BP 120/80 | Ht 68.5 in | Wt 254.0 lb

## 2011-05-28 DIAGNOSIS — R059 Cough, unspecified: Secondary | ICD-10-CM

## 2011-05-28 DIAGNOSIS — E782 Mixed hyperlipidemia: Secondary | ICD-10-CM

## 2011-05-28 DIAGNOSIS — R05 Cough: Secondary | ICD-10-CM

## 2011-05-28 DIAGNOSIS — Z23 Encounter for immunization: Secondary | ICD-10-CM

## 2011-05-28 DIAGNOSIS — E119 Type 2 diabetes mellitus without complications: Secondary | ICD-10-CM

## 2011-05-28 DIAGNOSIS — Z0000 Encounter for general adult medical examination without abnormal findings: Secondary | ICD-10-CM

## 2011-05-28 DIAGNOSIS — K219 Gastro-esophageal reflux disease without esophagitis: Secondary | ICD-10-CM

## 2011-05-28 DIAGNOSIS — E669 Obesity, unspecified: Secondary | ICD-10-CM

## 2011-05-28 NOTE — Progress Notes (Signed)
Subjective:    Patient ID: Alfred Johnson, male    DOB: 11/06/1970, 40 y.o.   MRN: [id number]  HPI Patient comes in today for Preventive Health Care visit  And med issues .   Joined a gym since late summer . No injury or sig change in health .  Losing weight  268 and now down to   253  DM doing better   With above   Has decrease actos off an on. 130 - 150 pp  No lows nl eye exam   HT  Thinks has cough from  Causing the  meds.  120/80   127.  Range  Off of acei and stopped cozaar on his own incase this is contributing no sob or wheeze   gerd  :   No  Sx now   Allergy takes med as needed  Review of Systems Hx of pain hamstring.   Cough  Worse with exercise but not bad.    No ha vision change eye check good no gi gu change no bleeding rash or neuropathy.  Past history family history social history reviewed in the electronic medical record.     Objective:   Physical Exam Physical Exam: Vital signs reviewed Ocass DRY COUGH  WUJ:WJXB is a well-developed well-nourished alert cooperative  White male  who appears   stated age in no acute distress.  HEENT: normocephalic  traumatic , Eyes: PERRL EOM's full, conjunctiva clear, Nares: patent no deformity discharge or tenderness., Ears: no deformity EAC's clear TMs with normal landmarks. Mouth: clear OP, no lesions, edema.  Moist mucous membranes. Dentition in adequate repair. NECK: supple without masses, thyromegaly or bruits. CHEST/PULM:  Clear to auscultation and percussion breath sounds equal no wheeze , rales or rhonchi. No chest wall deformities or tenderness. CV: PMI is nondisplaced, S1 S2 no gallops, murmurs, rubs. Peripheral pulses are full without delay.No JVD .  ABDOMEN: Bowel sounds normal nontender  No guard or rebound, no hepato splenomegal no CVA tenderness.  No hernia. Extremtities:  No clubbing cyanosis or edema, no acute joint swelling or redness no focal atrophy NEURO:  Oriented x3, cranial nerves 3-12 appear to be intact,  no obvious focal weakness,gait within normal limits no abnormal reflexes or asymmetrical SKIN: No acute rashes normal turgor, color, no bruising or petechiae. PSYCH: Oriented, good eye contact, no obvious depression anxiety, cognition and judgment appear normal. LN:  No cervical axillary or inguinal adenopathy      Lab Results  Component Value Date   WBC 8.1 05/21/2011   HGB 13.7 05/21/2011   HCT 41.1 05/21/2011   PLT 289.0 05/21/2011   GLUCOSE 137* 05/21/2011   CHOL 154 05/21/2011   TRIG 60.0 05/21/2011   HDL 40.70 05/21/2011   LDLDIRECT 114.5 02/26/2007   LDLCALC 101* 05/21/2011   ALT 19 05/21/2011   AST 20 05/21/2011   NA 141 05/21/2011   K 4.5 05/21/2011   CL 107 05/21/2011   CREATININE 0.8 05/21/2011   BUN 17 05/21/2011   CO2 26 05/21/2011   TSH 1.34 05/21/2011   HGBA1C 6.6* 05/21/2011   MICROALBUR 0.7 05/21/2011        Assessment & Plan:  Preventive Health Care Counseled regarding healthy nutrition, exercise, sleep, injury prevention, calcium vit d and healthy weight . Up to date  on healthcare parameters per hx   except hepatitis vaccine   Begin twinrix series  Indication diabetes   Cough stopped the losartan cause of this.  For  2. 5  weeks  And getting better.  Has tried ocass inhaler   And no help with exercise.  Unsure of the cause   dobt if coxaar but possible since bp is good can stay off and try benicar 20 mg samples given  Allergy ? If cause  Obesity  Encouraged to  aggressivelyContinue lifestyle intervention healthy eating and exercise .  DM  control per Dr Everardo All  Would ask dr E about staying on metformin  And talking with him about stopping med, BP  Has been normal LIPIDS  See results  Not on a statin now  Unsure who is addressing this but pt doin lsi for now

## 2011-05-28 NOTE — Assessment & Plan Note (Signed)
Better  

## 2011-05-28 NOTE — Assessment & Plan Note (Signed)
Will stay off  arb  For a few more weeks   Can try benicar  Instead and let us knwo how the cough is, Continue to lose weight

## 2011-05-28 NOTE — Patient Instructions (Addendum)
Ok stay off losartan    For a few more weeks and see if cough goes away   Can try another med if so.  Benicar 20mg .  If ongoing  Then it probalby not a med.   Call  About progress.   And then follow up.  Ask Dr Everardo All  About  Staying on metformin  Vs the actos.

## 2011-05-28 NOTE — Assessment & Plan Note (Signed)
Apparently quiescent

## 2011-05-29 ENCOUNTER — Encounter: Payer: Self-pay | Admitting: Internal Medicine

## 2011-06-04 ENCOUNTER — Other Ambulatory Visit: Payer: Self-pay | Admitting: *Deleted

## 2011-06-04 DIAGNOSIS — E119 Type 2 diabetes mellitus without complications: Secondary | ICD-10-CM

## 2011-06-16 ENCOUNTER — Other Ambulatory Visit: Payer: Self-pay | Admitting: Endocrinology

## 2011-06-30 ENCOUNTER — Ambulatory Visit: Payer: Medicare PPO | Admitting: Internal Medicine

## 2011-06-30 ENCOUNTER — Ambulatory Visit (INDEPENDENT_AMBULATORY_CARE_PROVIDER_SITE_OTHER): Payer: Medicare PPO | Admitting: Internal Medicine

## 2011-06-30 DIAGNOSIS — Z23 Encounter for immunization: Secondary | ICD-10-CM

## 2011-07-02 ENCOUNTER — Other Ambulatory Visit (INDEPENDENT_AMBULATORY_CARE_PROVIDER_SITE_OTHER): Payer: Medicare PPO

## 2011-07-02 ENCOUNTER — Other Ambulatory Visit: Payer: Medicare PPO

## 2011-07-02 DIAGNOSIS — E119 Type 2 diabetes mellitus without complications: Secondary | ICD-10-CM

## 2011-07-09 ENCOUNTER — Encounter: Payer: Self-pay | Admitting: Endocrinology

## 2011-07-09 ENCOUNTER — Ambulatory Visit (INDEPENDENT_AMBULATORY_CARE_PROVIDER_SITE_OTHER): Payer: Medicare PPO | Admitting: Endocrinology

## 2011-07-09 VITALS — BP 134/82 | HR 86 | Temp 98.4°F | Ht 70.0 in | Wt 266.2 lb

## 2011-07-09 DIAGNOSIS — E119 Type 2 diabetes mellitus without complications: Secondary | ICD-10-CM

## 2011-07-09 NOTE — Patient Instructions (Addendum)
good diet and exercise habits significanly improve the control of your diabetes.  please let me know if you wish to be referred to a dietician.  high blood sugar is very risky to your health.  you should see an eye doctor every year. controlling your blood pressure and cholesterol drastically reduces the damage diabetes does to your body.  this also applies to quitting smoking.  please discuss these with your doctor.  you should take an aspirin every day, unless you have been advised by a doctor not to. Same meds for now Please make a follow-up appointment in 6 months, with a1c prior.

## 2011-07-09 NOTE — Progress Notes (Signed)
Subjective:    Patient ID: Alfred Johnson, male    DOB: [birth year], 40 y.o.   MRN: [id number]  HPI Pt returns for f/u of type 2 DM (2008).  He says he has lost weight, due to his efforts.  pt states he feels well in general. Past Medical History  Diagnosis Date  . GERD (gastroesophageal reflux disease)   . Hypertension   . Elevated BP   . Diabetes mellitus   . KIDNEY STONE 02/05/2009  . ANEMIA DUE TO CHRONIC BLOOD LOSS 09/10/2009  . HYPERLIPIDEMIA, MIXED 03/23/2007  . VENTRICULAR FUNCTION, DECREASED 06/21/2007    No past surgical history on file.  History   Social History  . Marital Status: Married    Spouse Name: N/A    Number of Children: N/A  . Years of Education: N/A   Occupational History  .      Does not work outside the home   Social History Main Topics  . Smoking status: Former Games developer  . Smokeless tobacco: Former Neurosurgeon    Quit date: 07/28/1992  . Alcohol Use: No  . Drug Use: No  . Sexually Active: Not on file   Other Topics Concern  . Not on file   Social History Narrative   HH of 3No petsFirearms locked awayDoes not work outside the [employer] at  Home dad    Current Outpatient Prescriptions on File Prior to Visit  Medication Sig Dispense Refill  . ACTOPLUS MET 15-850 MG per tablet TAKE ONE TABLET BY MOUTH THREE TIMES DAILY  90 each  10  . aspirin 81 MG tablet Take 81 mg by mouth daily.        . cetirizine-pseudoephedrine (ZYRTEC-D) 5-120 MG per tablet Take 1 tablet by mouth 2 (two) times daily.        Marland Kitchen glucose blood (ONE TOUCH ULTRA TEST) test strip Check blood sugar 5 to 6 times a day or as directed       . JANUVIA 100 MG tablet TAKE ONE TABLET BY MOUTH EVERY MORNING  30 each  2  . albuterol (PROVENTIL HFA;VENTOLIN HFA) 108 (90 BASE) MCG/ACT inhaler Inhale 2 puffs into the lungs every 6 (six) hours as needed for Wheezing.  1 Inhaler  2  . losartan (COZAAR) 50 MG tablet TAKE ONE TABLET BY MOUTH TWICE DAILY  60 tablet  2  . Multiple Vitamin (MULTIVITAMIN)  tablet Take 1 tablet by mouth daily.        . Omega-3 Fatty Acids (FISH OIL) 1000 MG CAPS Take 1 capsule by mouth daily.         No Known Allergies  Family History  Problem Relation Age of Onset  . Graves' disease Mother   . Kidney disease Father   . Heart failure Father   . Diabetes Brother   . HIV Brother     BP 134/82  Pulse 86  Temp(Src) 98.4 F (36.9 C) (Oral)  Ht 5\' 10"  (1.778 m)  Wt 266 lb 3.2 oz (120.748 kg)  BMI 38.20 kg/m2  SpO2 95%  Review of Systems Denies chest pain and sob.    Objective:   Physical Exam VITAL SIGNS:  See vs page GENERAL: no distress Pulses: dorsalis pedis intact bilat.   Feet: no deformity.  no ulcer on the feet.  feet are of normal color and temp.  no edema.   Neuro: sensation is intact to touch on the feet.     Lab Results  Component Value Date   HGBA1C  6.5 07/02/2011      Assessment & Plan:  DM, well-controlled

## 2011-11-25 ENCOUNTER — Ambulatory Visit: Payer: Medicare PPO | Admitting: Internal Medicine

## 2011-11-25 ENCOUNTER — Ambulatory Visit (INDEPENDENT_AMBULATORY_CARE_PROVIDER_SITE_OTHER): Payer: Medicare PPO

## 2011-11-25 DIAGNOSIS — Z23 Encounter for immunization: Secondary | ICD-10-CM

## 2011-11-29 ENCOUNTER — Other Ambulatory Visit: Payer: Self-pay | Admitting: Endocrinology

## 2011-12-24 ENCOUNTER — Other Ambulatory Visit (INDEPENDENT_AMBULATORY_CARE_PROVIDER_SITE_OTHER): Payer: Medicare PPO

## 2011-12-24 DIAGNOSIS — IMO0001 Reserved for inherently not codable concepts without codable children: Secondary | ICD-10-CM

## 2011-12-24 DIAGNOSIS — E119 Type 2 diabetes mellitus without complications: Secondary | ICD-10-CM

## 2011-12-31 ENCOUNTER — Encounter: Payer: Self-pay | Admitting: Endocrinology

## 2011-12-31 ENCOUNTER — Ambulatory Visit (INDEPENDENT_AMBULATORY_CARE_PROVIDER_SITE_OTHER): Payer: Medicare PPO | Admitting: Endocrinology

## 2011-12-31 VITALS — BP 122/72 | HR 75 | Temp 97.6°F | Ht 70.0 in | Wt 226.0 lb

## 2011-12-31 DIAGNOSIS — E119 Type 2 diabetes mellitus without complications: Secondary | ICD-10-CM

## 2011-12-31 MED ORDER — SITAGLIP PHOS-METFORMIN HCL ER 50-1000 MG PO TB24: 1.0000 | ORAL_TABLET | Freq: Two times a day (BID) | ORAL | Status: DC

## 2011-12-31 MED ORDER — PIOGLITAZONE HCL 45 MG PO TABS: 45.0000 mg | ORAL_TABLET | Freq: Every day | ORAL | Status: DC

## 2011-12-31 NOTE — Patient Instructions (Addendum)
good diet and exercise habits significanly improve the control of your diabetes.  please let me know if you wish to be referred to a dietician.  high blood sugar is very risky to your health.  you should see an eye doctor every year. controlling your blood pressure and cholesterol drastically reduces the damage diabetes does to your body.  this also applies to quitting smoking.  please discuss these with your doctor.  you should take an aspirin every day, unless you have been advised by a doctor not to. Change actos+ met to actos 45 mg daily. i have sent a prescription to [facility] Change januvia to janumet.  Here are some samples. Please make a follow-up appointment in 6 months, with a1c prior.

## 2011-12-31 NOTE — Progress Notes (Signed)
  Subjective:    Patient ID: Alfred Johnson, male    DOB: October 22, 1970, 41 y.o.   MRN: [id number]  HPI Pt returns for f/u of type 2 DM (dx'ed 2008; no known complications).  pt states he feels well in general.  no cbg record, but states cbg's are well-controlled. Past Medical History  Diagnosis Date  . GERD (gastroesophageal reflux disease)   . Hypertension   . Elevated BP   . Diabetes mellitus   . KIDNEY STONE 02/05/2009  . ANEMIA DUE TO CHRONIC BLOOD LOSS 09/10/2009  . HYPERLIPIDEMIA, MIXED 03/23/2007  . VENTRICULAR FUNCTION, DECREASED 06/21/2007    No past surgical history on file.  History   Social History  . Marital Status: Married    Spouse Name: N/A    Number of Children: N/A  . Years of Education: N/A   Occupational History  .      Does not work outside the home   Social History Main Topics  . Smoking status: Former Games developer  . Smokeless tobacco: Former Neurosurgeon    Quit date: 07/28/1992  . Alcohol Use: No  . Drug Use: No  . Sexually Active: Not on file   Other Topics Concern  . Not on file   Social History Narrative   HH of 3No petsFirearms locked awayDoes not work outside the [employer] at  Home dad    Current Outpatient Prescriptions on File Prior to Visit  Medication Sig Dispense Refill  . aspirin 81 MG tablet Take 81 mg by mouth daily.        . cetirizine-pseudoephedrine (ZYRTEC-D) 5-120 MG per tablet Take 1 tablet by mouth 2 (two) times daily.        Marland Kitchen glucose blood (ONE TOUCH ULTRA TEST) test strip Check blood sugar 5 to 6 times a day or as directed       . Multiple Vitamin (MULTIVITAMIN) tablet Take 1 tablet by mouth daily.        Marland Kitchen losartan (COZAAR) 50 MG tablet TAKE ONE TABLET BY MOUTH TWICE DAILY  60 tablet  2  . pioglitazone (ACTOS) 45 MG tablet Take 1 tablet (45 mg total) by mouth daily.  90 tablet  3  . pioglitazone-metformin (ACTOPLUS MET) 15-850 MG per tablet TAKE ONE TABLET BY MOUTH THREE TIMES DAILY  90 tablet  9  . SitaGLIPtin-MetFORMIN HCl  (JANUMET XR) 50-1000 MG TB24 Take 1 tablet by mouth 2 (two) times daily.  60 tablet  11    No Known Allergies  Family History  Problem Relation Age of Onset  . Graves' disease Mother   . Kidney disease Father   . Heart failure Father   . Diabetes Brother   . HIV Brother    BP 122/72  Pulse 75  Temp(Src) 97.6 F (36.4 C) (Oral)  Ht 5\' 10"  (1.778 m)  Wt 226 lb (102.513 kg)  BMI 32.43 kg/m2  SpO2 96%  Review of Systems denies hypoglycemia    Objective:   Physical Exam VITAL SIGNS:  See vs page GENERAL: no distress Pulses: dorsalis pedis intact bilat.   Feet: no deformity.  no ulcer on the feet.  feet are of normal color and temp.  no edema Neuro: sensation is intact to touch on the feet.  Lab Results  Component Value Date   HGBA1C 6.0 12/24/2011      Assessment & Plan:  DM.  well-controlled

## 2012-01-01 ENCOUNTER — Other Ambulatory Visit: Payer: Self-pay | Admitting: Endocrinology

## 2012-01-07 ENCOUNTER — Ambulatory Visit: Payer: Medicare PPO | Admitting: Endocrinology

## 2012-01-31 ENCOUNTER — Other Ambulatory Visit: Payer: Self-pay | Admitting: Endocrinology

## 2012-05-25 ENCOUNTER — Other Ambulatory Visit (INDEPENDENT_AMBULATORY_CARE_PROVIDER_SITE_OTHER): Payer: Medicare PPO

## 2012-05-25 DIAGNOSIS — Z0000 Encounter for general adult medical examination without abnormal findings: Secondary | ICD-10-CM

## 2012-05-25 DIAGNOSIS — Z79899 Other long term (current) drug therapy: Secondary | ICD-10-CM

## 2012-05-25 LAB — BASIC METABOLIC PANEL
BUN: 15 mg/dL (ref 6–23)
CO2: 29 mEq/L (ref 19–32)
Chloride: 103 mEq/L (ref 96–112)
Creatinine, Ser: 0.9 mg/dL (ref 0.4–1.5)
Glucose, Bld: 116 mg/dL — ABNORMAL HIGH (ref 70–99)

## 2012-05-25 LAB — POCT URINALYSIS DIPSTICK
Blood, UA: NEGATIVE
Glucose, UA: NEGATIVE
Nitrite, UA: NEGATIVE
Protein, UA: NEGATIVE
Spec Grav, UA: 1.015
Urobilinogen, UA: 0.2

## 2012-05-25 LAB — LIPID PANEL
HDL: 52.9 mg/dL (ref >39.00)
LDL Cholesterol: 85 mg/dL (ref 0–99)
Total CHOL/HDL Ratio: 3
Triglycerides: 53 mg/dL (ref 0.0–149.0)

## 2012-05-25 LAB — MICROALBUMIN / CREATININE URINE RATIO: Creatinine,U: 79.5 mg/dL

## 2012-05-25 LAB — HEPATIC FUNCTION PANEL
Bilirubin, Direct: 0.1 mg/dL (ref 0.0–0.3)
Total Bilirubin: 0.6 mg/dL (ref 0.3–1.2)

## 2012-05-25 LAB — CBC WITH DIFFERENTIAL/PLATELET
Eosinophils Absolute: 0.4 10*3/uL (ref 0.0–0.7)
Lymphs Abs: 2 10*3/uL (ref 0.7–4.0)
MCHC: 32.8 g/dL (ref 30.0–36.0)
MCV: 89.7 fl (ref 78.0–100.0)
Monocytes Absolute: 0.6 10*3/uL (ref 0.1–1.0)
Neutrophils Relative %: 50.6 % (ref 43.0–77.0)
Platelets: 274 10*3/uL (ref 150.0–400.0)
RDW: 13.7 % (ref 11.5–14.6)

## 2012-05-25 LAB — TSH: TSH: 1.47 u[IU]/mL (ref 0.35–5.50)

## 2012-05-25 LAB — HEMOGLOBIN A1C: Hgb A1c MFr Bld: 6 % (ref 4.6–6.5)

## 2012-06-01 ENCOUNTER — Ambulatory Visit (INDEPENDENT_AMBULATORY_CARE_PROVIDER_SITE_OTHER): Payer: 59 | Admitting: Internal Medicine

## 2012-06-01 ENCOUNTER — Encounter: Payer: Self-pay | Admitting: Internal Medicine

## 2012-06-01 VITALS — BP 134/82 | HR 81 | Temp 97.7°F | Ht 68.75 in | Wt 238.0 lb

## 2012-06-01 DIAGNOSIS — Z6835 Body mass index (BMI) 35.0-35.9, adult: Secondary | ICD-10-CM | POA: Insufficient documentation

## 2012-06-01 DIAGNOSIS — E119 Type 2 diabetes mellitus without complications: Secondary | ICD-10-CM

## 2012-06-01 DIAGNOSIS — Z0000 Encounter for general adult medical examination without abnormal findings: Secondary | ICD-10-CM | POA: Insufficient documentation

## 2012-06-01 DIAGNOSIS — I10 Essential (primary) hypertension: Secondary | ICD-10-CM

## 2012-06-01 DIAGNOSIS — Z23 Encounter for immunization: Secondary | ICD-10-CM

## 2012-06-01 NOTE — Patient Instructions (Signed)
Continue lifestyle intervention healthy eating and exercise . Preventive Care for Adults, Male A healthy lifestyle and preventive care can promote health and wellness. Preventive health guidelines for men include the following key practices:  A routine yearly physical is a good way to check with your caregiver about your health and preventative screening. It is a chance to share any concerns and updates on your health, and to receive a thorough exam.  Visit your dentist for a routine exam and preventative care every 6 months. Brush your teeth twice a day and floss once a day. Good oral hygiene prevents tooth decay and gum disease.  The frequency of eye exams is based on your age, health, family medical history, use of contact lenses, and other factors. Follow your caregiver's recommendations for frequency of eye exams.  Eat a healthy diet. Foods like vegetables, fruits, whole grains, low-fat dairy products, and lean protein foods contain the nutrients you need without too many calories. Decrease your intake of foods high in solid fats, added sugars, and salt. Eat the right amount of calories for you.Get information about a proper diet from your caregiver, if necessary.  Regular physical exercise is one of the most important things you can do for your health. Most adults should get at least 150 minutes of moderate-intensity exercise (any activity that increases your heart rate and causes you to sweat) each week. In addition, most adults need muscle-strengthening exercises on 2 or more days a week.  Maintain a healthy weight. The body mass index (BMI) is a screening tool to identify possible weight problems. It provides an estimate of body fat based on height and weight. Your caregiver can help determine your BMI, and can help you achieve or maintain a healthy weight.For adults 20 years and older:  A BMI below 18.5 is considered underweight.  A BMI of 18.5 to 24.9 is normal.  A BMI of 25 to 29.9  is considered overweight.  A BMI of 30 and above is considered obese.  Maintain normal blood lipids and cholesterol levels by exercising and minimizing your intake of saturated fat. Eat a balanced diet with plenty of fruit and vegetables. Blood tests for lipids and cholesterol should begin at age 72 and be repeated every 5 years. If your lipid or cholesterol levels are high, you are over 50, or you are a high risk for heart disease, you may need your cholesterol levels checked more frequently.Ongoing high lipid and cholesterol levels should be treated with medicines if diet and exercise are not effective.  If you smoke, find out from your caregiver how to quit. If you do not use tobacco, do not start.  If you choose to drink alcohol, do not exceed 2 drinks per day. One drink is considered to be 12 ounces (355 mL) of beer, 5 ounces (148 mL) of wine, or 1.5 ounces (44 mL) of liquor.  Avoid use of street drugs. Do not share needles with anyone. Ask for help if you need support or instructions about stopping the use of drugs.  High blood pressure causes heart disease and increases the risk of stroke. Your blood pressure should be checked at least every 1 to 2 years. Ongoing high blood pressure should be treated with medicines, if weight loss and exercise are not effective.  If you are 42 to 43 years old, ask your caregiver if you should take aspirin to prevent heart disease.  Diabetes screening involves taking a blood sample to check your fasting blood sugar level.  This should be done once every 3 years, after age 42, if you are within normal weight and without risk factors for diabetes. Testing should be considered at a younger age or be carried out more frequently if you are overweight and have at least 1 risk factor for diabetes.  Colorectal cancer can be detected and often prevented. Most routine colorectal cancer screening begins at the age of 38 and continues through age 90. However, your  caregiver may recommend screening at an earlier age if you have risk factors for colon cancer. On a yearly basis, your caregiver may provide home test kits to check for hidden blood in the stool. Use of a small camera at the end of a tube, to directly examine the colon (sigmoidoscopy or colonoscopy), can detect the earliest forms of colorectal cancer. Talk to your caregiver about this at age 1, when routine screening begins. Direct examination of the colon should be repeated every 5 to 10 years through age 75, unless early forms of pre-cancerous polyps or small growths are found.  Hepatitis C blood testing is recommended for all people born from [birth year] through 1967 and any individual with known risks for hepatitis C.  Practice safe sex. Use condoms and avoid high-risk sexual practices to reduce the spread of sexually transmitted infections (STIs). STIs include gonorrhea, chlamydia, syphilis, trichomonas, herpes, HPV, and human immunodeficiency virus (HIV). Herpes, HIV, and HPV are viral illnesses that have no cure. They can result in disability, cancer, and death.  A one-time screening for abdominal aortic aneurysm (AAA) and surgical repair of large AAAs by sound wave imaging (ultrasonography) is recommended for ages 57 to 43 years who are current or former smokers.  Healthy men should no longer receive prostate-specific antigen (PSA) blood tests as part of routine cancer screening. Consult with your caregiver about prostate cancer screening.  Testicular cancer screening is not recommended for adult males who have no symptoms. Screening includes self-exam, caregiver exam, and other screening tests. Consult with your caregiver about any symptoms you have or any concerns you have about testicular cancer.  Use sunscreen with skin protection factor (SPF) of 30 or more. Apply sunscreen liberally and repeatedly throughout the day. You should seek shade when your shadow is shorter than you. Protect yourself by  wearing long sleeves, pants, a wide-brimmed hat, and sunglasses year round, whenever you are outdoors.  Once a month, do a whole body skin exam, using a mirror to look at the skin on your back. Notify your caregiver of new moles, moles that have irregular borders, moles that are larger than a pencil eraser, or moles that have changed in shape or color.  Stay current with required immunizations.  Influenza. You need a dose every fall (or winter). The composition of the flu vaccine changes each year, so being vaccinated once is not enough.  Pneumococcal polysaccharide. You need 1 to 2 doses if you smoke cigarettes or if you have certain chronic medical conditions. You need 1 dose at age 70 (or older) if you have never been vaccinated.  Tetanus, diphtheria, pertussis (Tdap, Td). Get 1 dose of Tdap vaccine if you are younger than age 63 years, are over 63 and have contact with an infant, are a Research scientist (physical sciences), or simply want to be protected from whooping cough. After that, you need a Td booster dose every 10 years. Consult your caregiver if you have not had at least 3 tetanus and diphtheria-containing shots sometime in your life or have a deep or  dirty wound.  HPV. This vaccine is recommended for males 13 through 43 years of age. This vaccine may be given to men 22 through 43 years of age who have not completed the 3 dose series. It is recommended for men through age 63 who have sex with men or whose immune system is weakened because of HIV infection, other illness, or medications. The vaccine is given in 3 doses over 6 months.  Measles, mumps, rubella (MMR). You need at least 1 dose of MMR if you were born in 1957 or later. You may also need a 2nd dose.  Meningococcal. If you are age 37 to 37 years and a Orthoptist living in a residence hall, or have one of several medical conditions, you need to get vaccinated against meningococcal disease. You may also need additional booster  doses.  Zoster (shingles). If you are age 62 years or older, you should get this vaccine.  Varicella (chickenpox). If you have never had chickenpox or you were vaccinated but received only 1 dose, talk to your caregiver to find out if you need this vaccine.  Hepatitis A. You need this vaccine if you have a specific risk factor for hepatitis A virus infection, or you simply wish to be protected from this disease. The vaccine is usually given as 2 doses, 6 to 18 months apart.  Hepatitis B. You need this vaccine if you have a specific risk factor for hepatitis B virus infection or you simply wish to be protected from this disease. The vaccine is given in 3 doses, usually over 6 months. Preventative Service / Frequency Ages 95 to 49  Blood pressure check.** / Every 1 to 2 years.  Lipid and cholesterol check.** / Every 5 years beginning at age 36.  Fecal occult blood test (FOBT) of stool. / Every year beginning at age 42 and continuing until age 41. You may not have to do this test if you get colonoscopy every 10 years.  Flexible sigmoidoscopy** or colonoscopy.** / Every 5 years for a flexible sigmoidoscopy or every 10 years for a colonoscopy beginning at age 4 and continuing until age 32.  Hepatitis C blood test.** / For all people born from [birth year] through 1967 and any individual with known risks for hepatitis C.  Skin self-exam. / Monthly.  Influenza immunization.** / Every year.  Pneumococcal polysaccharide immunization.** / 1 to 2 doses if you smoke cigarettes or if you have certain chronic medical conditions.  Tetanus, diphtheria, pertussis (Tdap/Td) immunization.** / A one-time dose of Tdap vaccine. After that, you need a Td booster dose every 10 years.  Measles, mumps, rubella (MMR) immunization. / You need at least 1 dose of MMR if you were born in 1957 or later. You may also need a 2nd dose.  Varicella immunization.**/ Consult your caregiver.  Meningococcal immunization.** /  Consult your caregiver.  Hepatitis A immunization.** / Consult your caregiver. 2 doses, 6 to 18 months apart.  Hepatitis B immunization.** / Consult your caregiver. 3 doses, usually over 6 months. **Family history and personal history of risk and conditions may change your caregiver's recommendations. Document Released: 09/09/2001 Document Revised: 10/06/2011 Document Reviewed: 12/09/2010 [facility] Patient Information 2013 [place], [place].

## 2012-06-01 NOTE — Progress Notes (Signed)
Chief Complaint  Patient presents with  . Annual Exam    HPI: Patient comes in today for Preventive Health Care visit  No major changes in his health since  last visit. He is followed by Dr. Everardo All for his diabetes. Has an eye doctor routine check no neuropathy unusual infections. Dr. Everardo All is prescribing him medication. Has followup visit in December.  ROS:  GEN/ HEENT: No fever, significant weight changes sweats headaches vision problems hearing changes, CV/ PULM; No chest pain shortness of breath  syncope,edema  change in exercise tolerance. Gets an intermittent dry cough not related to asthma allergy no shortness of breath remote history of inhaler not helping him no reflux symptoms. Feels well GI /GU: No adominal pain, vomiting, change in bowel habits. No blood in the stool. No significant GU symptoms. SKIN/HEME: ,no acute skin rashes suspicious lesions or bleeding. No lymphadenopathy, nodules, masses.  NEURO/ PSYCH:  No neurologic signs such as weakness numbness. No depression anxiety. IMM/ Allergy: No unusual infections.  Allergy .   REST of 12 system review negative except as per HPI   Past Medical History  Diagnosis Date  . GERD (gastroesophageal reflux disease)   . Hypertension   . Elevated BP   . Diabetes mellitus   . KIDNEY STONE 02/05/2009  . ANEMIA DUE TO CHRONIC BLOOD LOSS 09/10/2009  . HYPERLIPIDEMIA, MIXED 03/23/2007  . VENTRICULAR FUNCTION, DECREASED 06/21/2007    Family History  Problem Relation Age of Onset  . Graves' disease Mother   . Kidney disease Father   . Heart failure Father   . Diabetes Brother   . HIV Brother     History   Social History  . Marital Status: Married    Spouse Name: N/A    Number of Children: N/A  . Years of Education: N/A   Occupational History  .      Does not work outside the home   Social History Main Topics  . Smoking status: Former Games developer  . Smokeless tobacco: Former Neurosurgeon    Quit date: 07/28/1992  . Alcohol  Use: No  . Drug Use: No  . Sexually Active: None   Other Topics Concern  . None   Social History Narrative   HH of 3No petsFirearms locked awayDoes not work outside the [employer] at  Home dadExercises regularly and does [employer] at times    Outpatient Encounter Prescriptions as of 06/01/2012  Medication Sig Dispense Refill  . aspirin 81 MG tablet Take 81 mg by mouth daily.        . cetirizine-pseudoephedrine (ZYRTEC-D) 5-120 MG per tablet Take 1 tablet by mouth 2 (two) times daily.        Marland Kitchen glucose blood (ONE TOUCH ULTRA TEST) test strip Check blood sugar 5 to 6 times a day or as directed       . pioglitazone (ACTOS) 45 MG tablet Take 1 tablet (45 mg total) by mouth daily.  90 tablet  3  . SitaGLIPtin-MetFORMIN HCl (JANUMET XR) 50-1000 MG TB24 Take 1 tablet by mouth 2 (two) times daily.  60 tablet  11  . [DISCONTINUED] losartan (COZAAR) 50 MG tablet TAKE ONE TABLET BY MOUTH TWICE DAILY  60 tablet  2  . [DISCONTINUED] Multiple Vitamin (MULTIVITAMIN) tablet Take 1 tablet by mouth daily.        . [DISCONTINUED] pioglitazone-metformin (ACTOPLUS MET) 15-850 MG per tablet TAKE ONE TABLET BY MOUTH THREE TIMES DAILY  90 tablet  9    EXAM:  BP  134/82  Pulse 81  Temp 97.7 F (36.5 C) (Oral)  Ht 5' 8.75" (1.746 m)  Wt 238 lb (107.956 kg)  BMI 35.40 kg/m2  SpO2 99%  Body mass index is 35.40 kg/(m^2).  Physical Exam: Vital signs reviewed UJW:JXBJ is a well-developed well-nourished alert cooperative   male who appears her stated age in no acute distress.  HEENT: normocephalic atraumatic , Eyes: PERRL EOM's full, conjunctiva clear, Nares: paten,t no deformity discharge or tenderness., Ears: no deformity EAC's clear TMs with normal landmarks. Mouth: clear OP, no lesions, edema.  Moist mucous membranes. Dentition in adequate repair. NECK: supple without masses, thyromegaly or bruits. CHEST/PULM:  Clear to auscultation and percussion breath sounds equal no wheeze , rales or rhonchi. No  chest wall deformities or tenderness. CV: PMI is nondisplaced, S1 S2 no gallops, murmurs, rubs. Peripheral pulses are full without delay.No JVD .  ABDOMEN: Bowel sounds normal nontender  No guard or rebound, no hepato splenomegal no CVA tenderness.  No hernia. Extremtities:  No clubbing cyanosis or edema, no acute joint swelling or redness no focal atrophy NEURO:  Oriented x3, cranial nerves 3-12 appear to be intact, no obvious focal weakness,gait within normal limits no abnormal reflexes or asymmetrical SKIN: No acute rashes normal turgor, color, no bruising or petechiae. PSYCH: Oriented, good eye contact, no obvious depression anxiety, cognition and judgment appear normal. LN: no cervical axillary inguinal adenopathy  Lab Results  Component Value Date   WBC 6.1 05/25/2012   HGB 14.0 05/25/2012   HCT 42.8 05/25/2012   PLT 274.0 05/25/2012   GLUCOSE 116* 05/25/2012   CHOL 148 05/25/2012   TRIG 53.0 05/25/2012   HDL 52.90 05/25/2012   LDLDIRECT 114.5 02/26/2007   LDLCALC 85 05/25/2012   ALT 21 05/25/2012   AST 20 05/25/2012   NA 139 05/25/2012   K 4.6 05/25/2012   CL 103 05/25/2012   CREATININE 0.9 05/25/2012   BUN 15 05/25/2012   CO2 29 05/25/2012   TSH 1.47 05/25/2012   HGBA1C 6.0 05/25/2012   MICROALBUR 0.9 05/25/2012    ASSESSMENT AND PLAN: Preventive Health Care Counseled regarding healthy nutrition, exercise, sleep, injury prevention, calcium vit d and healthy weight . Flu vaccine Discussed the following assessment and plan: Care teams  1. Visit for preventive health examination   2. Need for prophylactic vaccination and inoculation against influenza   3. DM w/o Complication Type II   4. HYPERTENSION   5. BMI 35.0-35.9,adult    exercising and weight watchers      Patient Instructions  Continue lifestyle intervention healthy eating and exercise . Preventive Care for Adults, Male A healthy lifestyle and preventive care can promote health and wellness. Preventive  health guidelines for men include the following key practices:  A routine yearly physical is a good way to check with your caregiver about your health and preventative screening. It is a chance to share any concerns and updates on your health, and to receive a thorough exam.  Visit your dentist for a routine exam and preventative care every 6 months. Brush your teeth twice a day and floss once a day. Good oral hygiene prevents tooth decay and gum disease.  The frequency of eye exams is based on your age, health, family medical history, use of contact lenses, and other factors. Follow your caregiver's recommendations for frequency of eye exams.  Eat a healthy diet. Foods like vegetables, fruits, whole grains, low-fat dairy products, and lean protein foods contain the nutrients you need without  too many calories. Decrease your intake of foods high in solid fats, added sugars, and salt. Eat the right amount of calories for you.Get information about a proper diet from your caregiver, if necessary.  Regular physical exercise is one of the most important things you can do for your health. Most adults should get at least 150 minutes of moderate-intensity exercise (any activity that increases your heart rate and causes you to sweat) each week. In addition, most adults need muscle-strengthening exercises on 2 or more days a week.  Maintain a healthy weight. The body mass index (BMI) is a screening tool to identify possible weight problems. It provides an estimate of body fat based on height and weight. Your caregiver can help determine your BMI, and can help you achieve or maintain a healthy weight.For adults 20 years and older:  A BMI below 18.5 is considered underweight.  A BMI of 18.5 to 24.9 is normal.  A BMI of 25 to 29.9 is considered overweight.  A BMI of 30 and above is considered obese.  Maintain normal blood lipids and cholesterol levels by exercising and minimizing your intake of saturated  fat. Eat a balanced diet with plenty of fruit and vegetables. Blood tests for lipids and cholesterol should begin at age 54 and be repeated every 5 years. If your lipid or cholesterol levels are high, you are over 50, or you are a high risk for heart disease, you may need your cholesterol levels checked more frequently.Ongoing high lipid and cholesterol levels should be treated with medicines if diet and exercise are not effective.  If you smoke, find out from your caregiver how to quit. If you do not use tobacco, do not start.  If you choose to drink alcohol, do not exceed 2 drinks per day. One drink is considered to be 12 ounces (355 mL) of beer, 5 ounces (148 mL) of wine, or 1.5 ounces (44 mL) of liquor.  Avoid use of street drugs. Do not share needles with anyone. Ask for help if you need support or instructions about stopping the use of drugs.  High blood pressure causes heart disease and increases the risk of stroke. Your blood pressure should be checked at least every 1 to 2 years. Ongoing high blood pressure should be treated with medicines, if weight loss and exercise are not effective.  If you are 66 to 43 years old, ask your caregiver if you should take aspirin to prevent heart disease.  Diabetes screening involves taking a blood sample to check your fasting blood sugar level. This should be done once every 3 years, after age 76, if you are within normal weight and without risk factors for diabetes. Testing should be considered at a younger age or be carried out more frequently if you are overweight and have at least 1 risk factor for diabetes.  Colorectal cancer can be detected and often prevented. Most routine colorectal cancer screening begins at the age of 37 and continues through age 75. However, your caregiver may recommend screening at an earlier age if you have risk factors for colon cancer. On a yearly basis, your caregiver may provide home test kits to check for hidden blood in  the stool. Use of a small camera at the end of a tube, to directly examine the colon (sigmoidoscopy or colonoscopy), can detect the earliest forms of colorectal cancer. Talk to your caregiver about this at age 37, when routine screening begins. Direct examination of the colon should be repeated every  5 to 10 years through age 23, unless early forms of pre-cancerous polyps or small growths are found.  Hepatitis C blood testing is recommended for all people born from [birth year] through 1965 and any individual with known risks for hepatitis C.  Practice safe sex. Use condoms and avoid high-risk sexual practices to reduce the spread of sexually transmitted infections (STIs). STIs include gonorrhea, chlamydia, syphilis, trichomonas, herpes, HPV, and human immunodeficiency virus (HIV). Herpes, HIV, and HPV are viral illnesses that have no cure. They can result in disability, cancer, and death.  A one-time screening for abdominal aortic aneurysm (AAA) and surgical repair of large AAAs by sound wave imaging (ultrasonography) is recommended for ages 58 to 43 years who are current or former smokers.  Healthy men should no longer receive prostate-specific antigen (PSA) blood tests as part of routine cancer screening. Consult with your caregiver about prostate cancer screening.  Testicular cancer screening is not recommended for adult males who have no symptoms. Screening includes self-exam, caregiver exam, and other screening tests. Consult with your caregiver about any symptoms you have or any concerns you have about testicular cancer.  Use sunscreen with skin protection factor (SPF) of 30 or more. Apply sunscreen liberally and repeatedly throughout the day. You should seek shade when your shadow is shorter than you. Protect yourself by wearing long sleeves, pants, a wide-brimmed hat, and sunglasses year round, whenever you are outdoors.  Once a month, do a whole body skin exam, using a mirror to look at the skin on  your back. Notify your caregiver of new moles, moles that have irregular borders, moles that are larger than a pencil eraser, or moles that have changed in shape or color.  Stay current with required immunizations.  Influenza. You need a dose every fall (or winter). The composition of the flu vaccine changes each year, so being vaccinated once is not enough.  Pneumococcal polysaccharide. You need 1 to 2 doses if you smoke cigarettes or if you have certain chronic medical conditions. You need 1 dose at age 78 (or older) if you have never been vaccinated.  Tetanus, diphtheria, pertussis (Tdap, Td). Get 1 dose of Tdap vaccine if you are younger than age 9 years, are over 62 and have contact with an infant, are a Research scientist (physical sciences), or simply want to be protected from whooping cough. After that, you need a Td booster dose every 10 years. Consult your caregiver if you have not had at least 3 tetanus and diphtheria-containing shots sometime in your life or have a deep or dirty wound.  HPV. This vaccine is recommended for males 13 through 43 years of age. This vaccine may be given to men 22 through 43 years of age who have not completed the 3 dose series. It is recommended for men through age 68 who have sex with men or whose immune system is weakened because of HIV infection, other illness, or medications. The vaccine is given in 3 doses over 6 months.  Measles, mumps, rubella (MMR). You need at least 1 dose of MMR if you were born in 1957 or later. You may also need a 2nd dose.  Meningococcal. If you are age 59 to 42 years and a Orthoptist living in a residence hall, or have one of several medical conditions, you need to get vaccinated against meningococcal disease. You may also need additional booster doses.  Zoster (shingles). If you are age 35 years or older, you should get this vaccine.  Varicella (  chickenpox). If you have never had chickenpox or you were vaccinated but received only  1 dose, talk to your caregiver to find out if you need this vaccine.  Hepatitis A. You need this vaccine if you have a specific risk factor for hepatitis A virus infection, or you simply wish to be protected from this disease. The vaccine is usually given as 2 doses, 6 to 18 months apart.  Hepatitis B. You need this vaccine if you have a specific risk factor for hepatitis B virus infection or you simply wish to be protected from this disease. The vaccine is given in 3 doses, usually over 6 months. Preventative Service / Frequency Ages 59 to 88  Blood pressure check.** / Every 1 to 2 years.  Lipid and cholesterol check.** / Every 5 years beginning at age 69.  Fecal occult blood test (FOBT) of stool. / Every year beginning at age 21 and continuing until age 59. You may not have to do this test if you get colonoscopy every 10 years.  Flexible sigmoidoscopy** or colonoscopy.** / Every 5 years for a flexible sigmoidoscopy or every 10 years for a colonoscopy beginning at age 46 and continuing until age 74.  Hepatitis C blood test.** / For all people born from [birth year] through 1965 and any individual with known risks for hepatitis C.  Skin self-exam. / Monthly.  Influenza immunization.** / Every year.  Pneumococcal polysaccharide immunization.** / 1 to 2 doses if you smoke cigarettes or if you have certain chronic medical conditions.  Tetanus, diphtheria, pertussis (Tdap/Td) immunization.** / A one-time dose of Tdap vaccine. After that, you need a Td booster dose every 10 years.  Measles, mumps, rubella (MMR) immunization. / You need at least 1 dose of MMR if you were born in 1957 or later. You may also need a 2nd dose.  Varicella immunization.**/ Consult your caregiver.  Meningococcal immunization.** / Consult your caregiver.  Hepatitis A immunization.** / Consult your caregiver. 2 doses, 6 to 18 months apart.  Hepatitis B immunization.** / Consult your caregiver. 3 doses, usually over 6  months. **Family history and personal history of risk and conditions may change your caregiver's recommendations. Document Released: 09/09/2001 Document Revised: 10/06/2011 Document Reviewed: 12/09/2010 [facility] Patient Information 2013 [place], [place].      Lorretta Harp

## 2012-06-16 ENCOUNTER — Other Ambulatory Visit (INDEPENDENT_AMBULATORY_CARE_PROVIDER_SITE_OTHER): Payer: 59

## 2012-06-16 DIAGNOSIS — E119 Type 2 diabetes mellitus without complications: Secondary | ICD-10-CM

## 2012-06-16 LAB — HEMOGLOBIN A1C: Hgb A1c MFr Bld: 6 % (ref 4.6–6.5)

## 2012-06-30 ENCOUNTER — Ambulatory Visit (INDEPENDENT_AMBULATORY_CARE_PROVIDER_SITE_OTHER): Payer: 59 | Admitting: Endocrinology

## 2012-06-30 ENCOUNTER — Telehealth: Payer: Self-pay | Admitting: Endocrinology

## 2012-06-30 ENCOUNTER — Encounter: Payer: Self-pay | Admitting: Endocrinology

## 2012-06-30 VITALS — BP 136/80 | HR 78 | Temp 97.7°F | Wt 238.0 lb

## 2012-06-30 DIAGNOSIS — E119 Type 2 diabetes mellitus without complications: Secondary | ICD-10-CM

## 2012-06-30 NOTE — Patient Instructions (Addendum)
Please continue the same medications good diet and exercise habits significanly improve the control of your diabetes.  please let me know if you wish to be referred to a dietician.  high blood sugar is very risky to your health.  you should see an eye doctor every year.   controlling your blood pressure and cholesterol drastically reduces the damage diabetes does to your body.  this also applies to quitting smoking.  please discuss these with your doctor.  you should take an aspirin every day, unless you have been advised by a doctor not to. Please come back for a follow-up appointment in 1 year.

## 2012-06-30 NOTE — Telephone Encounter (Signed)
ok 

## 2012-06-30 NOTE — Progress Notes (Signed)
  Subjective:    Patient ID: Alfred Johnson, male    DOB: [birth year], 41 y.o.   MRN: [id number]  HPI Pt returns for f/u of type 2 DM (dx'ed 2008; no known complications).  pt states he feels well in general.  no cbg record, but states cbg's are well-controlled.   Past Medical History  Diagnosis Date  . GERD (gastroesophageal reflux disease)   . Hypertension   . Elevated BP   . Diabetes mellitus   . KIDNEY STONE 02/05/2009  . ANEMIA DUE TO CHRONIC BLOOD LOSS 09/10/2009  . HYPERLIPIDEMIA, MIXED 03/23/2007  . VENTRICULAR FUNCTION, DECREASED 06/21/2007    No past surgical history on file.  History   Social History  . Marital Status: Married    Spouse Name: N/A    Number of Children: N/A  . Years of Education: N/A   Occupational History  .      Does not work outside the home   Social History Main Topics  . Smoking status: Former Games developer  . Smokeless tobacco: Former Neurosurgeon    Quit date: 07/28/1992  . Alcohol Use: No  . Drug Use: No  . Sexually Active: Not on file   Other Topics Concern  . Not on file   Social History Narrative   HH of 3No petsFirearms locked awayDoes not work outside the [employer] at  Home dadExercises regularly and does [employer] at times    Current Outpatient Prescriptions on File Prior to Visit  Medication Sig Dispense Refill  . aspirin 81 MG tablet Take 81 mg by mouth daily.        . cetirizine-pseudoephedrine (ZYRTEC-D) 5-120 MG per tablet Take 1 tablet by mouth 2 (two) times daily.        Marland Kitchen glucose blood (ONE TOUCH ULTRA TEST) test strip Check blood sugar 5 to 6 times a day or as directed       . pioglitazone (ACTOS) 45 MG tablet Take 1 tablet (45 mg total) by mouth daily.  90 tablet  3  . SitaGLIPtin-MetFORMIN HCl (JANUMET XR) 50-1000 MG TB24 Take 1 tablet by mouth 2 (two) times daily.  60 tablet  11    No Known Allergies  Family History  Problem Relation Age of Onset  . Graves' disease Mother   . Kidney disease Father   . Heart failure  Father   . Diabetes Brother   . HIV Brother     BP 136/80  Pulse 78  Temp 97.7 F (36.5 C) (Oral)  Wt 238 lb (107.956 kg)  SpO2 99%    Review of Systems denies weight gain    Objective:   Physical Exam Pulses: dorsalis pedis intact bilat.   Feet: no deformity.  no ulcer on the feet.  feet are of normal color and temp.  no edema Neuro: sensation is intact to touch on the feet.  Lab Results  Component Value Date   HGBA1C 6.0 06/16/2012      Assessment & Plan:  DM, well-controlled

## 2012-06-30 NOTE — Telephone Encounter (Signed)
Pt advised and states an understanding 

## 2013-01-04 ENCOUNTER — Other Ambulatory Visit: Payer: Self-pay | Admitting: Endocrinology

## 2013-02-28 ENCOUNTER — Other Ambulatory Visit: Payer: Self-pay | Admitting: Endocrinology

## 2013-04-26 ENCOUNTER — Other Ambulatory Visit: Payer: Self-pay | Admitting: Endocrinology

## 2013-05-11 ENCOUNTER — Ambulatory Visit (INDEPENDENT_AMBULATORY_CARE_PROVIDER_SITE_OTHER): Payer: 59

## 2013-05-11 DIAGNOSIS — Z23 Encounter for immunization: Secondary | ICD-10-CM

## 2013-05-17 ENCOUNTER — Other Ambulatory Visit (INDEPENDENT_AMBULATORY_CARE_PROVIDER_SITE_OTHER): Payer: 59

## 2013-05-17 DIAGNOSIS — Z0000 Encounter for general adult medical examination without abnormal findings: Secondary | ICD-10-CM

## 2013-05-17 LAB — HEPATIC FUNCTION PANEL
ALT: 13 U/L (ref 0–53)
AST: 17 U/L (ref 0–37)
Albumin: 4.4 g/dL (ref 3.5–5.2)
Total Bilirubin: 0.5 mg/dL (ref 0.3–1.2)
Total Protein: 7.4 g/dL (ref 6.0–8.3)

## 2013-05-17 LAB — LIPID PANEL
Cholesterol: 131 mg/dL (ref 0–200)
HDL: 44.8 mg/dL (ref >39.00)
LDL Cholesterol: 78 mg/dL (ref 0–99)
Triglycerides: 40 mg/dL (ref 0.0–149.0)
VLDL: 8 mg/dL (ref 0.0–40.0)

## 2013-05-17 LAB — CBC WITH DIFFERENTIAL/PLATELET
Basophils Absolute: 0.1 10*3/uL (ref 0.0–0.1)
Eosinophils Relative: 3.9 % (ref 0.0–5.0)
HCT: 40.7 % (ref 39.0–52.0)
Lymphocytes Relative: 32.2 % (ref 12.0–46.0)
Lymphs Abs: 2 10*3/uL (ref 0.7–4.0)
Monocytes Relative: 7.9 % (ref 3.0–12.0)
Platelets: 279 10*3/uL (ref 150.0–400.0)
RDW: 13.8 % (ref 11.5–14.6)
WBC: 6.3 10*3/uL (ref 4.5–10.5)

## 2013-05-17 LAB — MICROALBUMIN / CREATININE URINE RATIO
Creatinine,U: 49.8 mg/dL
Microalb, Ur: 0.1 mg/dL (ref 0.0–1.9)

## 2013-05-17 LAB — BASIC METABOLIC PANEL
BUN: 20 mg/dL (ref 6–23)
Calcium: 9.7 mg/dL (ref 8.4–10.5)
GFR: 96.14 mL/min (ref >60.00)
Potassium: 4.9 mEq/L (ref 3.5–5.1)
Sodium: 140 mEq/L (ref 135–145)

## 2013-05-30 ENCOUNTER — Other Ambulatory Visit: Payer: 59

## 2013-06-06 ENCOUNTER — Ambulatory Visit (INDEPENDENT_AMBULATORY_CARE_PROVIDER_SITE_OTHER): Payer: 59 | Admitting: Internal Medicine

## 2013-06-06 ENCOUNTER — Encounter: Payer: Self-pay | Admitting: Internal Medicine

## 2013-06-06 VITALS — BP 140/86 | HR 83 | Temp 98.1°F | Ht 68.5 in | Wt 251.0 lb

## 2013-06-06 DIAGNOSIS — J069 Acute upper respiratory infection, unspecified: Secondary | ICD-10-CM

## 2013-06-06 DIAGNOSIS — I10 Essential (primary) hypertension: Secondary | ICD-10-CM

## 2013-06-06 DIAGNOSIS — E119 Type 2 diabetes mellitus without complications: Secondary | ICD-10-CM

## 2013-06-06 DIAGNOSIS — Z6835 Body mass index (BMI) 35.0-35.9, adult: Secondary | ICD-10-CM

## 2013-06-06 DIAGNOSIS — Z23 Encounter for immunization: Secondary | ICD-10-CM

## 2013-06-06 DIAGNOSIS — E782 Mixed hyperlipidemia: Secondary | ICD-10-CM

## 2013-06-06 DIAGNOSIS — Z0000 Encounter for general adult medical examination without abnormal findings: Secondary | ICD-10-CM

## 2013-06-06 NOTE — Patient Instructions (Signed)
150 minutes of exercise weeks  ,  Lose weight  To healthy levels. Avoid trans fats and processed foods;  Increase fresh fruits and veges to 5 servings per day. And avoid sweet beverages  Including tea and juice.  Yearly check up. Update pneumonia vaccine today .

## 2013-06-06 NOTE — Progress Notes (Signed)
Chief Complaint  Patient presents with  . Annual Exam    HPI: Patient comes in today for Preventive Health Care visit   Exercising 3 x per week. Could be doing better with weight and eating.  Sees dr E yearly  To see a retinal spec to check eye exam  But no problems   No major change in health status since last visit .  Has a uri sinus congestion using irrigation no fever   bp usually  118 120 range  On decongestant for his head cold   Neg tobb some caffiene ocass etoh  ROS:  GEN/ HEENT: No fever, significant weight changes sweats headaches vision problems hearing changes, CV/ PULM; No chest pain shortness of breath  syncope,edema  change in exercise tolerance. GI /GU: No adominal pain, vomiting, change in bowel habits. No blood in the stool. No significant GU symptoms. SKIN/HEME: ,no acute skin rashes suspicious lesions or bleeding. No lymphadenopathy, nodules, masses.  NEURO/ PSYCH:  No neurologic signs such as weakness numbness. No depression anxiety. IMM/ Allergy: No unusual infections.  Allergy .   REST of 12 system review negative except as per HPI   Past Medical History  Diagnosis Date  . GERD (gastroesophageal reflux disease)   . Hypertension   . Elevated BP   . Diabetes mellitus   . KIDNEY STONE 02/05/2009  . ANEMIA DUE TO CHRONIC BLOOD LOSS 09/10/2009  . HYPERLIPIDEMIA, MIXED 03/23/2007  . VENTRICULAR FUNCTION, DECREASED 06/21/2007    Family History  Problem Relation Age of Onset  . Graves' disease Mother   . Kidney disease Father   . Heart failure Father   . Diabetes Brother   . HIV Brother     History   Social History  . Marital Status: Married    Spouse Name: N/A    Number of Children: N/A  . Years of Education: N/A   Occupational History  .      Does not work outside the home   Social History Main Topics  . Smoking status: Former Games developer  . Smokeless tobacco: Former Neurosurgeon    Quit date: 07/28/1992  . Alcohol Use: No  . Drug Use: No  . Sexual  Activity: None   Other Topics Concern  . 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 aspirin 81 MG tablet Take 81 mg by mouth daily.    . cetirizine-pseudoephedrine (ZYRTEC-D) 5-120 MG per tablet Take 1 tablet by mouth 2 (two) times daily.    Marland Kitchen glucose blood (ONE TOUCH ULTRA TEST) test strip Check blood sugar 5 to 6 times a day or as directed   . pioglitazone (ACTOS) 45 MG tablet TAKE 1 TABLET (45 MG TOTAL) BY MOUTH DAILY.  Marland Kitchen SitaGLIPtin-MetFORMIN HCl (JANUMET XR) 50-1000 MG TB24 Take 2 tabs once daily  . [DISCONTINUED] JANUMET XR 50-1000 MG TB24 TAKE ONE TABLET BY MOUTH TWICE DAILY     EXAM:  BP 140/86  Pulse 83  Temp(Src) 98.1 F (36.7 C) (Oral)  Ht 5' 8.5" (1.74 m)  Wt 251 lb (113.853 kg)  BMI 37.61 kg/m2  SpO2 98%  Body mass index is 37.61 kg/(m^2).  Physical Exam: Vital signs reviewed GMW:NUUV is a well-developed well-nourished  alert cooperative  male who appears stated age in no acute distress.  Mild ur congestion HEENT: normocephalic atraumatic , Eyes: PERRL EOM's full, conjunctiva clear, Nares: paten,t no deformity discharge or tenderness., Ears: no deformity EAC's clear TMs with normal landmarks. Mouth: clear OP, no lesions, edema.  Moist mucous membranes. Dentition in adequate repair. NECK: supple without masses, thyromegaly or bruits. CHEST/PULM:  Clear to auscultation and percussion breath sounds equal no wheeze , rales or rhonchi. No chest wall deformities or tenderness. CV: PMI is nondisplaced, S1 S2 no gallops, murmurs, rubs. Peripheral pulses are full without delay.No JVD .  ABDOMEN: Bowel sounds normal nontender  No guard or rebound, no hepato splenomegal no CVA tenderness.  No hernia. Extremtities:  No clubbing cyanosis or edema, no acute joint swelling  or redness no focal atrophy NEURO:  Oriented x3, cranial nerves 3-12 appear to be intact, no obvious focal weakness,gait within normal limits no abnormal reflexes or asymmetrical SKIN: No acute rashes normal turgor, color, no bruising or petechiae. Diabetic ffot exam normal  PSYCH: Oriented, good eye contact, no obvious depression anxiety, cognition and judgment appear normal. LN: no cervical axillary inguinal adenopathy  Lab Results  Component Value Date   WBC 6.3 05/17/2013   HGB 13.8 05/17/2013   HCT 40.7 05/17/2013   PLT 279.0 05/17/2013   GLUCOSE 120* 05/17/2013   CHOL 131 05/17/2013   TRIG 40.0 05/17/2013   HDL 44.80 05/17/2013   LDLDIRECT 114.5 02/26/2007   LDLCALC 78 05/17/2013   ALT 13 05/17/2013   AST 17 05/17/2013   NA 140 05/17/2013   K 4.9 05/17/2013   CL 105 05/17/2013   CREATININE 0.9 05/17/2013   BUN 20 05/17/2013   CO2 27 05/17/2013   TSH 1.19 05/17/2013   HGBA1C 6.2 05/17/2013   MICROALBUR 0.1 05/17/2013    ASSESSMENT AND PLAN:  Discussed the following assessment and plan:  Visit for preventive health examination - utd booster  give prvnar 13  today   URI, acute  Diabetes mellitus type 2, controlled, without complications  Need for prophylactic vaccination against Streptococcus pneumoniae (pneumococcus) - Plan: Pneumococcal conjugate vaccine 13-valent IM  HYPERTENSION  HYPERLIPIDEMIA, MIXED  BMI 35.0-35.9,adult prenvar booster  13 today   Patient Care Team: Madelin Headings, MD as PCP - General Romero Belling, MD as Attending Physician (Internal Medicine) Chucky May, MD (Ophthalmology) Sherrie George, MD as Consulting Physician (Ophthalmology) Patient Instructions  150 minutes of exercise weeks  ,  Lose weight  To healthy levels. Avoid trans fats and processed foods;  Increase fresh fruits and veges to 5 servings per day. And avoid sweet beverages  Including tea and juice.  Yearly check up. Update pneumonia vaccine today .      Neta Mends. Janson Lamar M.D.  Health Maintenance  Topic Date Due  . Tetanus/tdap  07/28/2013  . Influenza Vaccine  02/25/2014   Health Maintenance Review

## 2013-06-09 ENCOUNTER — Encounter (INDEPENDENT_AMBULATORY_CARE_PROVIDER_SITE_OTHER): Payer: Medicare PPO | Admitting: Ophthalmology

## 2013-06-09 DIAGNOSIS — E11319 Type 2 diabetes mellitus with unspecified diabetic retinopathy without macular edema: Secondary | ICD-10-CM

## 2013-06-09 DIAGNOSIS — H43819 Vitreous degeneration, unspecified eye: Secondary | ICD-10-CM

## 2013-06-09 DIAGNOSIS — E1139 Type 2 diabetes mellitus with other diabetic ophthalmic complication: Secondary | ICD-10-CM

## 2013-06-16 ENCOUNTER — Other Ambulatory Visit: Payer: 59

## 2013-06-29 ENCOUNTER — Other Ambulatory Visit: Payer: Self-pay | Admitting: Endocrinology

## 2013-06-30 ENCOUNTER — Ambulatory Visit (INDEPENDENT_AMBULATORY_CARE_PROVIDER_SITE_OTHER): Payer: 59 | Admitting: Endocrinology

## 2013-06-30 ENCOUNTER — Other Ambulatory Visit: Payer: Self-pay | Admitting: Endocrinology

## 2013-06-30 ENCOUNTER — Encounter: Payer: Self-pay | Admitting: Endocrinology

## 2013-06-30 VITALS — BP 116/80 | HR 81 | Temp 97.8°F | Ht 68.5 in | Wt 242.0 lb

## 2013-06-30 DIAGNOSIS — E119 Type 2 diabetes mellitus without complications: Secondary | ICD-10-CM

## 2013-06-30 NOTE — Patient Instructions (Signed)
check your blood sugar once a day.  vary the time of day when you check, between before the 3 meals, and at bedtime.  also check if you have symptoms of your blood sugar being too high or too low.  please keep a record of the readings and bring it to your next appointment here.  You can write it on any piece of paper.  please call us sooner if your blood sugar goes below 70, or if you have a lot of readings over 200. Please come back for a follow-up appointment in 1 year.

## 2013-06-30 NOTE — Progress Notes (Signed)
   Subjective:    Patient ID: Alfred Johnson, male    DOB: 07/13/1971, 42 y.o.   MRN: [id number]  HPI Pt returns for f/u of type 2 DM (dx'ed 2008, on a routine blood test; he has mild if any neuropathy of the lower extremities; no known associated complications; he has never been on insulin).  pt states he feels well in general.  no cbg record, but states cbg's are well-controlled well-controlled.  Past Medical History  Diagnosis Date  . GERD (gastroesophageal reflux disease)   . Hypertension   . Elevated BP   . Diabetes mellitus   . KIDNEY STONE 02/05/2009  . ANEMIA DUE TO CHRONIC BLOOD LOSS 09/10/2009  . HYPERLIPIDEMIA, MIXED 03/23/2007  . VENTRICULAR FUNCTION, DECREASED 06/21/2007    No past surgical history on file.  History   Social History  . Marital Status: Married    Spouse Name: N/A    Number of Children: N/A  . Years of Education: N/A   Occupational History  .      Does not work outside the home   Social History Main Topics  . Smoking status: Former Games developer  . Smokeless tobacco: Former Neurosurgeon    Quit date: 07/28/1992  . Alcohol Use: No  . Drug Use: No  . Sexual Activity: Not on file   Other Topics Concern  . 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 aspirin 81 MG tablet Take 81 mg by mouth daily.        . cetirizine-pseudoephedrine (ZYRTEC-D) 5-120 MG per tablet Take 1 tablet by mouth 2 (two) times daily.        Marland Kitchen glucose blood (ONE TOUCH ULTRA TEST) test strip Check blood sugar 5 to 6 times a day or as directed       . pioglitazone (ACTOS) 45 MG tablet TAKE 1 TABLET (45 MG TOTAL) BY MOUTH DAILY.  90 tablet  1  . SitaGLIPtin-MetFORMIN HCl (JANUMET XR) 50-1000 MG TB24 Take 2 tabs once daily       No current  facility-administered medications on file prior to visit.    No Known Allergies  Family History  Problem Relation Age of Onset  . Graves' disease Mother   . Kidney disease Father   . Heart failure Father   . Diabetes Brother   . HIV Brother     BP 116/80  Pulse 81  Temp(Src) 97.8 F (36.6 C) (Oral)  Ht 5' 8.5" (1.74 m)  Wt 242 lb (109.77 kg)  BMI 36.26 kg/m2  SpO2 94%   Review of Systems He has weight gain.  He denies hypoglycemia.     Objective:   Physical Exam VITAL SIGNS:  See vs page GENERAL: no distress   Lab Results  Component Value Date   HGBA1C 6.2 05/17/2013      Assessment & Plan:  DM: well-controlled Obesity: this complicates the rx of DM

## 2013-08-14 ENCOUNTER — Other Ambulatory Visit: Payer: Self-pay | Admitting: Endocrinology

## 2013-09-08 ENCOUNTER — Other Ambulatory Visit: Payer: Self-pay | Admitting: Endocrinology

## 2013-11-23 ENCOUNTER — Telehealth: Payer: Self-pay | Admitting: Family Medicine

## 2013-11-23 MED ORDER — AMOXICILLIN 500 MG PO CAPS: 500.0000 mg | ORAL_CAPSULE | Freq: Two times a day (BID) | ORAL | Status: DC

## 2013-11-23 NOTE — Telephone Encounter (Signed)
Per WP, okay to send in amoxicillin 500 mg to take 1 po bid #20 with 0 additional refills Pt notified to pick up at [facility] on [address].

## 2013-11-23 NOTE — Telephone Encounter (Signed)
Patient was here earlier today with his wife who was diagnosed with strep throat.  Patient started running a fever of 101.3 around 1 pm and since then it has increased to 101.8.  Complaining of a sore throat and not feeling well.  He would like a medication sent to the pharmacy.  Please advise.  Thanks!

## 2014-02-08 ENCOUNTER — Ambulatory Visit (INDEPENDENT_AMBULATORY_CARE_PROVIDER_SITE_OTHER)
Admission: RE | Admit: 2014-02-08 | Discharge: 2014-02-08 | Disposition: A | Discharge status: Home / Self Care | Payer: 59 | Source: Ambulatory Visit | Attending: Internal Medicine | Admitting: Internal Medicine

## 2014-02-08 ENCOUNTER — Encounter: Payer: Self-pay | Admitting: Internal Medicine

## 2014-02-08 ENCOUNTER — Ambulatory Visit (INDEPENDENT_AMBULATORY_CARE_PROVIDER_SITE_OTHER): Payer: 59 | Admitting: Internal Medicine

## 2014-02-08 VITALS — BP 126/76 | HR 75 | Temp 100.6°F | Ht 68.5 in | Wt 251.0 lb

## 2014-02-08 DIAGNOSIS — J189 Pneumonia, unspecified organism: Secondary | ICD-10-CM

## 2014-02-08 DIAGNOSIS — J181 Lobar pneumonia, unspecified organism: Principal | ICD-10-CM

## 2014-02-08 DIAGNOSIS — R509 Fever, unspecified: Secondary | ICD-10-CM

## 2014-02-08 DIAGNOSIS — J988 Other specified respiratory disorders: Secondary | ICD-10-CM

## 2014-02-08 HISTORY — DX: Pneumonia, unspecified organism: J18.9

## 2014-02-08 MED ORDER — LEVOFLOXACIN 750 MG PO TABS: 750.0000 mg | ORAL_TABLET | Freq: Every day | ORAL | Status: DC

## 2014-02-08 NOTE — Patient Instructions (Signed)
Get a chest x ray  Concern about  Early pneumonia . Will add antibiotic depending on  X ray result. Expect fever to be gone in 2 days  Or less
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
## 2014-02-23 ENCOUNTER — Other Ambulatory Visit: Payer: Self-pay | Admitting: Endocrinology

## 2014-05-17 ENCOUNTER — Other Ambulatory Visit: Payer: Self-pay | Admitting: Endocrinology

## 2014-05-17 NOTE — Telephone Encounter (Signed)
Rx refilled.

## 2014-05-17 NOTE — Telephone Encounter (Signed)
Please advise if ok to refill. Pt has not been seen since 06/30/2013.  Thanks!

## 2014-05-17 NOTE — Telephone Encounter (Signed)
Please refill x 1 Ov is due  

## 2014-05-30 ENCOUNTER — Other Ambulatory Visit (INDEPENDENT_AMBULATORY_CARE_PROVIDER_SITE_OTHER): Payer: 59

## 2014-05-30 DIAGNOSIS — Z0000 Encounter for general adult medical examination without abnormal findings: Secondary | ICD-10-CM

## 2014-05-30 LAB — MICROALBUMIN / CREATININE URINE RATIO
Creatinine,U: 50.6 mg/dL
Microalb Creat Ratio: 0.6 mg/g (ref 0.0–30.0)
Microalb, Ur: 0.3 mg/dL (ref 0.0–1.9)

## 2014-05-30 LAB — LIPID PANEL
Cholesterol: 158 mg/dL (ref 0–200)
HDL: 43.2 mg/dL (ref >39.00)
LDL Cholesterol: 96 mg/dL (ref 0–99)
NONHDL: 114.8
Total CHOL/HDL Ratio: 4
Triglycerides: 94 mg/dL (ref 0.0–149.0)
VLDL: 18.8 mg/dL (ref 0.0–40.0)

## 2014-05-30 LAB — HEPATIC FUNCTION PANEL
ALK PHOS: 52 U/L (ref 39–117)
ALT: 20 U/L (ref 0–53)
AST: 18 U/L (ref 0–37)
Albumin: 3.9 g/dL (ref 3.5–5.2)
BILIRUBIN DIRECT: 0 mg/dL (ref 0.0–0.3)
BILIRUBIN TOTAL: 0.5 mg/dL (ref 0.2–1.2)
TOTAL PROTEIN: 7.7 g/dL (ref 6.0–8.3)

## 2014-05-30 LAB — BASIC METABOLIC PANEL
BUN: 16 mg/dL (ref 6–23)
CO2: 27 meq/L (ref 19–32)
CREATININE: 1 mg/dL (ref 0.4–1.5)
Calcium: 9.7 mg/dL (ref 8.4–10.5)
Chloride: 103 mEq/L (ref 96–112)
GFR: 91.05 mL/min (ref >60.00)
Glucose, Bld: 121 mg/dL — ABNORMAL HIGH (ref 70–99)
Potassium: 4.5 mEq/L (ref 3.5–5.1)
Sodium: 139 mEq/L (ref 135–145)

## 2014-05-30 LAB — CBC WITH DIFFERENTIAL/PLATELET
BASOS ABS: 0.1 10*3/uL (ref 0.0–0.1)
BASOS PCT: 0.7 % (ref 0.0–3.0)
EOS ABS: 0.3 10*3/uL (ref 0.0–0.7)
Eosinophils Relative: 4.4 % (ref 0.0–5.0)
HCT: 44.3 % (ref 39.0–52.0)
Hemoglobin: 14.6 g/dL (ref 13.0–17.0)
LYMPHS PCT: 32.7 % (ref 12.0–46.0)
Lymphs Abs: 2.4 10*3/uL (ref 0.7–4.0)
MCHC: 32.9 g/dL (ref 30.0–36.0)
MCV: 87.7 fl (ref 78.0–100.0)
Monocytes Absolute: 0.6 10*3/uL (ref 0.1–1.0)
Monocytes Relative: 8.7 % (ref 3.0–12.0)
NEUTROS PCT: 53.5 % (ref 43.0–77.0)
Neutro Abs: 3.9 10*3/uL (ref 1.4–7.7)
PLATELETS: 278 10*3/uL (ref 150.0–400.0)
RBC: 5.05 Mil/uL (ref 4.22–5.81)
RDW: 14.2 % (ref 11.5–15.5)
WBC: 7.3 10*3/uL (ref 4.0–10.5)

## 2014-05-30 LAB — HEMOGLOBIN A1C: HEMOGLOBIN A1C: 6.2 % (ref 4.6–6.5)

## 2014-05-30 LAB — TSH: TSH: 1.11 u[IU]/mL (ref 0.35–4.50)

## 2014-06-06 ENCOUNTER — Ambulatory Visit (INDEPENDENT_AMBULATORY_CARE_PROVIDER_SITE_OTHER): Payer: 59 | Admitting: Internal Medicine

## 2014-06-06 ENCOUNTER — Encounter: Payer: Self-pay | Admitting: Internal Medicine

## 2014-06-06 VITALS — BP 126/90 | Temp 98.6°F | Ht 68.5 in | Wt 259.9 lb

## 2014-06-06 DIAGNOSIS — E119 Type 2 diabetes mellitus without complications: Secondary | ICD-10-CM

## 2014-06-06 DIAGNOSIS — Z6835 Body mass index (BMI) 35.0-35.9, adult: Secondary | ICD-10-CM

## 2014-06-06 DIAGNOSIS — Z0000 Encounter for general adult medical examination without abnormal findings: Secondary | ICD-10-CM

## 2014-06-06 NOTE — Patient Instructions (Addendum)
Healthy life style   Weight loss .  Get your flu vaccine at some point.  colonoscopy at 73    Healthy lifestyle includes : At least 150 minutes of exercise weeks  , weight at healthy levels, which is usually   BMI 19-25. Avoid trans fats and processed foods;  Increase fresh fruits and veges to 5 servings per day. And avoid sweet beverages including tea and juice. Mediterranean diet with olive oil and nuts have been noted to be heart and brain healthy . Avoid tobacco products . Limit  alcohol to  7 per week for women and 14 servings for men.  Get adequate sleep . Wear seat belts . Don't text and drive .
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
## 2014-06-13 ENCOUNTER — Ambulatory Visit (INDEPENDENT_AMBULATORY_CARE_PROVIDER_SITE_OTHER): Payer: 59 | Admitting: Endocrinology

## 2014-06-13 ENCOUNTER — Encounter: Payer: Self-pay | Admitting: Endocrinology

## 2014-06-13 VITALS — BP 124/88 | HR 80 | Temp 98.1°F | Ht 68.5 in | Wt 264.0 lb

## 2014-06-13 DIAGNOSIS — E119 Type 2 diabetes mellitus without complications: Secondary | ICD-10-CM

## 2014-06-13 MED ORDER — SITAGLIP PHOS-METFORMIN HCL ER 50-1000 MG PO TB24: 1.0000 | ORAL_TABLET | Freq: Two times a day (BID) | ORAL | Status: DC

## 2014-06-13 MED ORDER — PIOGLITAZONE HCL 45 MG PO TABS: ORAL_TABLET | ORAL | Status: DC

## 2014-06-13 NOTE — Progress Notes (Signed)
Subjective:    Patient ID: Alfred Johnson, male    DOB: Feb 07, 1971, 43 y.o.   MRN: [id number]  HPI  Pt returns for f/u of diabetes mellitus: DM type: 2 Dx'ed: 3220 Complications: none Therapy: 3 oral meds DKA: never Severe hypoglycemia: never Pancreatitis: never Other: he has never been on insulin; he declines weight loss surgery. Interval history: pt states he feels well in general.  He says cbg's are well-controlled.   Past Medical History  Diagnosis Date  . GERD (gastroesophageal reflux disease)   . Hypertension   . Elevated BP   . Diabetes mellitus   . KIDNEY STONE 02/05/2009  . ANEMIA DUE TO CHRONIC BLOOD LOSS 09/10/2009  . HYPERLIPIDEMIA, MIXED 03/23/2007  . VENTRICULAR FUNCTION, DECREASED 06/21/2007    No past surgical history on file.  History   Social History  . Marital Status: Married    Spouse Name: N/A    Number of Children: N/A  . Years of Education: N/A   Occupational History  .      Does not work outside the home   Social History Main Topics  . Smoking status: Former Research scientist (life sciences)  . Smokeless tobacco: Former Systems developer    Quit date: 07/28/1992  . Alcohol Use: No  . Drug Use: No  . Sexual Activity: Not on file   Other Topics Concern  . 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     Current Outpatient Prescriptions on File Prior to Visit  Medication Sig Dispense Refill  . aspirin 81 MG tablet Take 81 mg by mouth daily.      . cetirizine-pseudoephedrine (ZYRTEC-D) 5-120 MG per tablet Take 1 tablet by mouth 2 (two) times daily.      Marland Kitchen glucose blood (ONE TOUCH ULTRA TEST) test strip Check blood sugar 5 to 6 times a day or as directed      No current facility-administered medications on file prior to visit.    No Known Allergies  Family History  Problem Relation Age of Onset  . Graves'  disease Mother   . Kidney disease Father   . Heart failure Father   . Diabetes Brother   . HIV Brother     BP 124/88 mmHg  Pulse 80  Temp(Src) 98.1 F (36.7 C) (Oral)  Ht 5' 8.5" (1.74 m)  Wt 264 lb (119.75 kg)  BMI 39.55 kg/m2  SpO2 99%   Review of Systems He denies n/v.      Objective:   Physical Exam VITAL SIGNS:  See vs page GENERAL: no distress Pulses: dorsalis pedis intact bilat.   Feet: no deformity.  no edema Skin:  no ulcer on the feet.  normal color and temp. Neuro: sensation is intact to touch on the feet   Lab Results  Component Value Date   HGBA1C 6.2 05/30/2014      Assessment & Plan:  DM: well-controlled  Patient is advised the following: Patient Instructions  check your blood sugar once a day.  vary the time of day when you check, between before the 3 meals, and at bedtime.  also check if you have symptoms of your blood sugar being too high or too low.  please keep a record of the readings and  bring it to your next appointment here.  You can write it on any piece of paper.  please call us sooner if your blood sugar goes below 70, or if you have a lot of readings over 200. Please come back for a follow-up appointment in 1 year.   good diet and exercise habits significanly improve the control of your diabetes.  please let me know if you wish to be referred to a dietician.  high blood sugar is very risky to your health.  you should see an eye doctor and dentist every year.  It is very important to get all recommended vaccinations.  controlling your blood pressure and cholesterol drastically reduces the damage diabetes does to your body.  Those who smoke should quit.  please discuss these with your doctor.

## 2014-06-13 NOTE — Patient Instructions (Addendum)
check your blood sugar once a day.  vary the time of day when you check, between before the 3 meals, and at bedtime.  also check if you have symptoms of your blood sugar being too high or too low.  please keep a record of the readings and bring it to your next appointment here.  You can write it on any piece of paper.  please call us sooner if your blood sugar goes below 70, or if you have a lot of readings over 200. Please come back for a follow-up appointment in 1 year.   good diet and exercise habits significanly improve the control of your diabetes.  please let me know if you wish to be referred to a dietician.  high blood sugar is very risky to your health.  you should see an eye doctor and dentist every year.  It is very important to get all recommended vaccinations.  controlling your blood pressure and cholesterol drastically reduces the damage diabetes does to your body.  Those who smoke should quit.  please discuss these with your doctor.

## 2014-06-30 ENCOUNTER — Ambulatory Visit: Payer: 59 | Admitting: Family Medicine

## 2014-06-30 ENCOUNTER — Ambulatory Visit (INDEPENDENT_AMBULATORY_CARE_PROVIDER_SITE_OTHER): Payer: 59

## 2014-06-30 DIAGNOSIS — Z23 Encounter for immunization: Secondary | ICD-10-CM

## 2014-09-06 ENCOUNTER — Telehealth: Payer: Self-pay | Admitting: Endocrinology

## 2014-09-06 MED ORDER — SAXAGLIPTIN-METFORMIN ER 2.5-1000 MG PO TB24: 1.0000 | ORAL_TABLET | Freq: Two times a day (BID) | ORAL | Status: DC

## 2014-09-06 NOTE — Telephone Encounter (Signed)
Contacted pt and advised of note below. He stated he was going to contact his insurance company about getting the [place] approved because it has been working well for him. Pt states he will call back and let us know what the insurance company says.

## 2014-09-06 NOTE — Telephone Encounter (Signed)
please call patient: Ins wants you to change janumet to kombiglyze.  i have sent a prescription to your pharmacy

## 2014-11-01 ENCOUNTER — Ambulatory Visit (INDEPENDENT_AMBULATORY_CARE_PROVIDER_SITE_OTHER): Payer: 59 | Admitting: Internal Medicine

## 2014-11-01 ENCOUNTER — Encounter: Payer: Self-pay | Admitting: Internal Medicine

## 2014-11-01 VITALS — BP 132/88 | HR 89 | Ht 69.0 in | Wt 259.9 lb

## 2014-11-01 DIAGNOSIS — E119 Type 2 diabetes mellitus without complications: Secondary | ICD-10-CM | POA: Diagnosis not present

## 2014-11-01 DIAGNOSIS — Z8249 Family history of ischemic heart disease and other diseases of the circulatory system: Secondary | ICD-10-CM

## 2014-11-01 DIAGNOSIS — Z6835 Body mass index (BMI) 35.0-35.9, adult: Secondary | ICD-10-CM

## 2014-11-01 DIAGNOSIS — I10 Essential (primary) hypertension: Secondary | ICD-10-CM

## 2014-11-01 DIAGNOSIS — E782 Mixed hyperlipidemia: Secondary | ICD-10-CM | POA: Diagnosis not present

## 2014-11-01 DIAGNOSIS — Z13220 Encounter for screening for lipoid disorders: Secondary | ICD-10-CM

## 2014-11-01 NOTE — Progress Notes (Signed)
OFFICE NOTE  Chief Complaint:  Evaluate for coronary disease  Primary Care Physician: Alfred Dawson, MD  HPI:  Alfred Johnson is a pleasant 44 year old male who is coming referred to me by Dr. Regis Johnson for evaluation of coronary disease. His past history significant for diabetes followed by Dr. Loanne Alfred Johnson in endocrinology. He does not have hypertension and is not on treatment for any dyslipidemia. His last lipid profile showed an LDL cholesterol 95 with total cholesterol less than 160. He does have a strong family history of heart disease with heart attacks in his father and both grandfathers. He also recently had a brother who had 3 vessel, bypass one month ago. Since that time he's been having some intermittent symptoms with vague, atypical sounding chest pain and occasional lightheadedness. He did have a pneumonia last summer. Fortunately, he is very active. He's been doing significant exercise several days a week including both aerobic and resistance training. He's managed to have no symptoms such as chest pain or worsening shortness of breath during these exercises.  PMHx:  Past Medical History  Diagnosis Date  . GERD (gastroesophageal reflux disease)   . Hypertension   . Elevated BP   . Diabetes mellitus   . KIDNEY STONE 02/05/2009  . ANEMIA DUE TO CHRONIC BLOOD LOSS 09/10/2009  . HYPERLIPIDEMIA, MIXED 03/23/2007  . VENTRICULAR FUNCTION, DECREASED 06/21/2007    No past surgical history on file.  FAMHx:  Family History  Problem Relation Age of Onset  . Graves' disease Mother   . Heart attack Maternal Grandfather   . Heart attack Paternal Grandfather   . Diabetes Brother   . HIV Brother   . Heart disease Brother 2    CABG  . Heart attack Father 65    COD  . Heart failure Father   . Kidney disease Father     SOCHx:   reports that he has quit smoking. He quit smokeless tobacco use about 22 years ago. He reports that he does not drink alcohol or use illicit  drugs.  ALLERGIES:  No Known Allergies  ROS: A comprehensive review of systems was negative except for: Cardiovascular: positive for chest pain  HOME MEDS: Current Outpatient Prescriptions  Medication Sig Dispense Refill  . aspirin 81 MG tablet Take 81 mg by mouth daily.      . cetirizine (ZYRTEC) 10 MG tablet Take 10 mg by mouth daily.    Marland Kitchen glucose blood (ONE TOUCH ULTRA TEST) test strip Check blood sugar 5 to 6 times a day or as directed     . pioglitazone (ACTOS) 45 MG tablet TAKE 1 TABLET BY MOUTH ONCE A DAY 90 tablet 3  . Saxagliptin-Metformin (KOMBIGLYZE XR) 2.11-998 MG TB24 Take 1 tablet by mouth 2 (two) times daily. 60 tablet 11   No current facility-administered medications for this visit.    LABS/IMAGING: No results found for this or any previous visit (from the past 48 hour(s)). No results found.  WEIGHTS: Wt Readings from Last 3 Encounters:  11/01/14 259 lb 14.4 oz (117.89 kg)  06/13/14 264 lb (119.75 kg)  06/06/14 259 lb 14.4 oz (117.89 kg)    VITALS: BP 132/88 mmHg  Pulse 89  Ht 5\' 9"  (1.753 m)  Wt 259 lb 14.4 oz (117.89 kg)  BMI 38.36 kg/m2  EXAM: General appearance: alert and no distress Neck: no carotid bruit, no JVD and thyroid not enlarged, symmetric, no tenderness/mass/nodules Lungs: clear to auscultation bilaterally Heart: regular rate and rhythm, S1, S2 normal,  no murmur, click, rub or gallop Abdomen: soft, non-tender; bowel sounds normal; no masses,  no organomegaly Extremities: extremities normal, atraumatic, no cyanosis or edema Pulses: 2+ and symmetric Skin: Skin color, texture, turgor normal. No rashes or lesions Neurologic: Grossly normal Psych: Pleasant, mildly anxious  EKG: Normal sinus rhythm at 89, no ischemia  ASSESSMENT: 1. Atypical chest pain 2. Strong family history of coronary disease 3. Type 2 diabetes 4. Obesity  PLAN: 1.   Mr. Alfred Johnson has a number of coronary risk factors. He is essentially asymptomatic. He can  exercise at a high rate without any symptoms. Review his lipid profile indicates he may still be a coronary risk. Given his diabetes is a coronary risk equivalent, I feel that he should have a lower LDL, perhaps less than 70. It would be helpful to reassess this and evaluate particle numbers which could better risk stratify him. In addition, to answer the question as to whether he has any premature coronary disease, and recommend a coronary calcium score. If this is markedly elevated, there may be indication for stress testing even though he seems to be asymptomatic. Should he have elevated coronary calcium, would be a stronger argument to start him on statin therapy to lower LDL and particle numbers.  Plan to see him back to discuss results of the studies in a few weeks. Thanks again for the consultation.  Alfred Casino, MD, [facility] Attending Cardiologist [facility]  Alfred Johnson C 11/01/2014, 5:04 PM

## 2014-11-01 NOTE — Patient Instructions (Signed)
Your physician recommends that you return for lab work in: FASTING  Dr. Debara Pickett has ordered a coronary calcium score - done at [address] [facility]  Your physician recommends that you schedule a follow-up appointment in 3-4 weeks

## 2014-11-02 ENCOUNTER — Ambulatory Visit (INDEPENDENT_AMBULATORY_CARE_PROVIDER_SITE_OTHER)
Admission: RE | Admit: 2014-11-02 | Discharge: 2014-11-02 | Disposition: A | Discharge status: Home / Self Care | Payer: 59 | Source: Ambulatory Visit | Attending: Internal Medicine | Admitting: Internal Medicine

## 2014-11-02 DIAGNOSIS — E119 Type 2 diabetes mellitus without complications: Secondary | ICD-10-CM

## 2014-11-02 DIAGNOSIS — Z8249 Family history of ischemic heart disease and other diseases of the circulatory system: Secondary | ICD-10-CM

## 2014-11-04 LAB — NMR LIPOPROFILE WITH LIPIDS
Cholesterol, Total: 175 mg/dL (ref 100–199)
HDL PARTICLE NUMBER: 32.2 umol/L (ref >=30.5)
HDL Size: 8.1 nm — ABNORMAL LOW (ref >=9.2)
HDL-C: 45 mg/dL (ref >39)
LARGE VLDL-P: 1.6 nmol/L (ref <=2.7)
LDL CALC: 117 mg/dL — AB (ref 0–99)
LDL Particle Number: 1566 nmol/L — ABNORMAL HIGH (ref <1000)
LDL Size: 20.8 nm (ref >=20.8)
LP-IR Score: 61 — ABNORMAL HIGH (ref <=45)
Large HDL-P: <1.3 umol/L — ABNORMAL LOW (ref >=4.8)
Small LDL Particle Number: 530 nmol/L — ABNORMAL HIGH (ref <=527)
TRIGLYCERIDES: 64 mg/dL (ref 0–149)
VLDL Size: 44.7 nm (ref <=46.6)

## 2014-11-06 ENCOUNTER — Other Ambulatory Visit: Payer: Self-pay | Admitting: *Deleted

## 2014-12-07 ENCOUNTER — Ambulatory Visit (INDEPENDENT_AMBULATORY_CARE_PROVIDER_SITE_OTHER): Payer: 59 | Admitting: Internal Medicine

## 2014-12-07 ENCOUNTER — Encounter: Payer: Self-pay | Admitting: Internal Medicine

## 2014-12-07 VITALS — BP 132/76 | HR 78 | Ht 70.0 in | Wt 267.4 lb

## 2014-12-07 DIAGNOSIS — I10 Essential (primary) hypertension: Secondary | ICD-10-CM

## 2014-12-07 DIAGNOSIS — Z79899 Other long term (current) drug therapy: Secondary | ICD-10-CM | POA: Diagnosis not present

## 2014-12-07 DIAGNOSIS — E782 Mixed hyperlipidemia: Secondary | ICD-10-CM

## 2014-12-07 DIAGNOSIS — R911 Solitary pulmonary nodule: Secondary | ICD-10-CM

## 2014-12-07 DIAGNOSIS — E119 Type 2 diabetes mellitus without complications: Secondary | ICD-10-CM

## 2014-12-07 MED ORDER — ROSUVASTATIN CALCIUM 20 MG PO TABS: 20.0000 mg | ORAL_TABLET | Freq: Every day | ORAL | Status: DC

## 2014-12-07 NOTE — Patient Instructions (Signed)
Your physician wants you to follow-up in: ONE YEAR. You will receive a reminder letter in the mail two months in advance. If you don't receive a letter, please call our office to schedule the follow-up appointment.  Your physician recommends that you return for lab work in:1 week and again 3 months  START Crestor 20mg  Daily

## 2014-12-07 NOTE — Progress Notes (Signed)
OFFICE NOTE  Chief Complaint:  Follow-up studies  Primary Care Physician: Alfred Dawson, MD  HPI:  Alfred Johnson is a pleasant 44 year old male who is coming referred to me by Alfred Johnson for evaluation of coronary disease. His past history significant for diabetes followed by Dr. Loanne Johnson in endocrinology. He does not have hypertension and is not on treatment for any dyslipidemia. His last lipid profile showed an LDL cholesterol 95 with total cholesterol less than 160. He does have a strong family history of heart disease with heart attacks in his father and both grandfathers. He also recently had a brother who had 3 vessel, bypass one month ago. Since that time he's been having some intermittent symptoms with vague, atypical sounding chest pain and occasional lightheadedness. He did have a pneumonia last summer. Fortunately, he is very active. He's been doing significant exercise several days a week including both aerobic and resistance training. He's managed to have no symptoms such as chest pain or worsening shortness of breath during these exercises.  Alfred Johnson returns today for follow-up. He underwent a CT coronary artery calcium score. This resulted back is 0. Unfortunately he was noted to have some small pulmonary nodules. He will need a repeat plain chest CT in one year. He also obtained a cholesterol profile which demonstrated an LDL-P of 1566, LDL-C of 117, HDL 45, triglycerides 64 and total cholesterol 175. Although he has no chronic calcium, the diabetic he should have risk factor modification. We talked about cholesterol medications and he did research with his insurance company as to what medications would be the most cost effective. He is interested in taking Crestor which I think is a good medication.  PMHx:  Past Medical History  Diagnosis Date  . GERD (gastroesophageal reflux disease)   . Hypertension   . Elevated BP   . Diabetes mellitus   . KIDNEY STONE  02/05/2009  . ANEMIA DUE TO CHRONIC BLOOD LOSS 09/10/2009  . HYPERLIPIDEMIA, MIXED 03/23/2007  . VENTRICULAR FUNCTION, DECREASED 06/21/2007    No past surgical history on file.  FAMHx:  Family History  Problem Relation Age of Onset  . Graves' disease Mother   . Heart attack Maternal Grandfather   . Heart attack Paternal Grandfather   . Diabetes Brother   . HIV Brother   . Heart disease Brother 28    CABG  . Heart attack Father 57    COD  . Heart failure Father   . Kidney disease Father     SOCHx:   reports that he has quit smoking. He quit smokeless tobacco use about 22 years ago. He reports that he does not drink alcohol or use illicit drugs.  ALLERGIES:  No Known Allergies  ROS: A comprehensive review of systems was negative.  HOME MEDS: Current Outpatient Prescriptions  Medication Sig Dispense Refill  . aspirin 81 MG tablet Take 81 mg by mouth daily.      . cetirizine (ZYRTEC) 10 MG tablet Take 10 mg by mouth daily.    Marland Kitchen glucose blood (ONE TOUCH ULTRA TEST) test strip Check blood sugar 5 to 6 times a day or as directed     . pioglitazone (ACTOS) 45 MG tablet TAKE 1 TABLET BY MOUTH ONCE A DAY 90 tablet 3  . Saxagliptin-Metformin (KOMBIGLYZE XR) 2.11-998 MG TB24 Take 1 tablet by mouth 2 (two) times daily. 60 tablet 11  . rosuvastatin (CRESTOR) 20 MG tablet Take 1 tablet (20 mg total) by mouth daily. Alfred Johnson  tablet 3   No current facility-administered medications for this visit.    LABS/IMAGING: No results found for this or any previous visit (from the past 48 hour(s)). No results found.  WEIGHTS: Wt Readings from Last 3 Encounters:  12/07/14 267 lb 6.4 oz (121.292 kg)  11/01/14 259 lb 14.4 oz (117.89 kg)  06/13/14 264 lb (119.75 kg)    VITALS: BP 132/76 mmHg  Pulse 78  Ht 5\' 10"  (1.778 m)  Wt 267 lb 6.4 oz (121.292 kg)  BMI 38.37 kg/m2  EXAM: Deferred.  EKG: Deferred   ASSESSMENT: 1. Atypical chest pain - No coronary artery calcium 2. Strong family  history of coronary disease 3. Type 2 diabetes 4. Obesity 5. Dyslipidemia  PLAN: 1.   [name] fortunately has no coronary artery calcium. This is very reassuring. As he is a diabetic, his cholesterol targets are lower. I think he benefit from statin therapy. We'll start him on Crestor 20 mg daily. He will need a repeat CT of the chest in one year as he does have some small pulmonary nodules. Plan to see him back at that time. We'll go ahead and recheck a lipid profile in 3 months and a metabolic profile and CK in one week.  Thanks again for the consultation.  Alfred Casino, MD, [facility] Attending Cardiologist [place] 12/07/2014, 4:37 PM

## 2014-12-19 LAB — COMPREHENSIVE METABOLIC PANEL
ALBUMIN: 4.4 g/dL (ref 3.5–5.2)
ALT: 28 U/L (ref 0–53)
AST: 22 U/L (ref 0–37)
Alkaline Phosphatase: 53 U/L (ref 39–117)
BILIRUBIN TOTAL: 0.5 mg/dL (ref 0.2–1.2)
BUN: 17 mg/dL (ref 6–23)
CHLORIDE: 102 meq/L (ref 96–112)
CO2: 25 mEq/L (ref 19–32)
Calcium: 9.4 mg/dL (ref 8.4–10.5)
Creat: 0.84 mg/dL (ref 0.50–1.35)
Glucose, Bld: 128 mg/dL — ABNORMAL HIGH (ref 70–99)
Potassium: 4.8 mEq/L (ref 3.5–5.3)
SODIUM: 137 meq/L (ref 135–145)
Total Protein: 7 g/dL (ref 6.0–8.3)

## 2014-12-19 LAB — CK: Total CK: 146 U/L (ref 7–232)

## 2015-05-02 ENCOUNTER — Telehealth: Payer: Self-pay | Admitting: Internal Medicine

## 2015-05-02 ENCOUNTER — Other Ambulatory Visit: Payer: Self-pay | Admitting: *Deleted

## 2015-05-02 DIAGNOSIS — E782 Mixed hyperlipidemia: Secondary | ICD-10-CM

## 2015-05-02 NOTE — Telephone Encounter (Signed)
Lab order placed for cardio IQ

## 2015-05-05 LAB — CARDIO IQ(R) ADVANCED LIPID PANEL
Apolipoprotein B: 42 mg/dL — ABNORMAL LOW (ref 52–109)
Cholesterol, Total: 96 mg/dL — ABNORMAL LOW (ref 125–200)
Cholesterol/HDL Ratio: 2.7 calc (ref <=5.0)
HDL Cholesterol: 36 mg/dL — ABNORMAL LOW (ref >=40)
LDL CHOLESTEROL CALCULATED (CARDIO IQ ADV LIPID PANEL): 49 mg/dL
LDL Large: 5412 nmol/L (ref 4334–10815)
LDL MEDIUM: 159 nmol/L — AB (ref 167–465)
LDL PARTICLE NUMBER: 906 nmol/L — AB (ref 1016–2185)
LDL PEAK SIZE: 213.9 Angstrom — AB (ref >=218.2)
LDL SMALL: 174 nmol/L (ref 123–441)
LIPOPROTEIN (A) (CARDIO IQ ADV LIPID PANEL): 89 nmol/L — AB (ref <75)
NON-HDL CHOLESTEROL (CARDIO IQ ADV LIPID PANEL): 60 mg/dL
TRIGLYCERIDES (CARDIO IQ ADV LIPID PANEL): 57 mg/dL

## 2015-05-08 LAB — HM DIABETES EYE EXAM

## 2015-05-21 ENCOUNTER — Encounter: Payer: Self-pay | Admitting: *Deleted

## 2015-05-30 ENCOUNTER — Other Ambulatory Visit (INDEPENDENT_AMBULATORY_CARE_PROVIDER_SITE_OTHER): Payer: 59

## 2015-05-30 ENCOUNTER — Encounter: Payer: Self-pay | Admitting: Internal Medicine

## 2015-05-30 DIAGNOSIS — Z0000 Encounter for general adult medical examination without abnormal findings: Secondary | ICD-10-CM | POA: Diagnosis not present

## 2015-05-30 LAB — HEPATIC FUNCTION PANEL
ALK PHOS: 53 U/L (ref 39–117)
ALT: 23 U/L (ref 0–53)
AST: 20 U/L (ref 0–37)
Albumin: 4.4 g/dL (ref 3.5–5.2)
BILIRUBIN DIRECT: 0.1 mg/dL (ref 0.0–0.3)
Total Bilirubin: 0.5 mg/dL (ref 0.2–1.2)
Total Protein: 7.4 g/dL (ref 6.0–8.3)

## 2015-05-30 LAB — CBC WITH DIFFERENTIAL/PLATELET
Basophils Absolute: 0 10*3/uL (ref 0.0–0.1)
Basophils Relative: 0.7 % (ref 0.0–3.0)
EOS PCT: 4.4 % (ref 0.0–5.0)
Eosinophils Absolute: 0.3 10*3/uL (ref 0.0–0.7)
HCT: 43.2 % (ref 39.0–52.0)
Hemoglobin: 14.2 g/dL (ref 13.0–17.0)
LYMPHS ABS: 1.9 10*3/uL (ref 0.7–4.0)
LYMPHS PCT: 30.2 % (ref 12.0–46.0)
MCHC: 32.9 g/dL (ref 30.0–36.0)
MCV: 87.9 fl (ref 78.0–100.0)
MONOS PCT: 10 % (ref 3.0–12.0)
Monocytes Absolute: 0.6 10*3/uL (ref 0.1–1.0)
NEUTROS ABS: 3.5 10*3/uL (ref 1.4–7.7)
NEUTROS PCT: 54.7 % (ref 43.0–77.0)
PLATELETS: 255 10*3/uL (ref 150.0–400.0)
RBC: 4.92 Mil/uL (ref 4.22–5.81)
RDW: 14.2 % (ref 11.5–15.5)
WBC: 6.4 10*3/uL (ref 4.0–10.5)

## 2015-05-30 LAB — MICROALBUMIN / CREATININE URINE RATIO
CREATININE, U: 185.7 mg/dL
MICROALB UR: 0.9 mg/dL (ref 0.0–1.9)
MICROALB/CREAT RATIO: 0.5 mg/g (ref 0.0–30.0)

## 2015-05-30 LAB — HEMOGLOBIN A1C: HEMOGLOBIN A1C: 6.9 % — AB (ref 4.6–6.5)

## 2015-05-30 LAB — BASIC METABOLIC PANEL
BUN: 13 mg/dL (ref 6–23)
CO2: 31 meq/L (ref 19–32)
Calcium: 9.8 mg/dL (ref 8.4–10.5)
Chloride: 105 mEq/L (ref 96–112)
Creatinine, Ser: 0.85 mg/dL (ref 0.40–1.50)
GFR: 103.06 mL/min (ref >60.00)
GLUCOSE: 166 mg/dL — AB (ref 70–99)
POTASSIUM: 4.8 meq/L (ref 3.5–5.1)
SODIUM: 141 meq/L (ref 135–145)

## 2015-05-30 LAB — LIPID PANEL
CHOL/HDL RATIO: 2
Cholesterol: 86 mg/dL (ref 0–200)
HDL: 40.4 mg/dL (ref >39.00)
LDL CALC: 36 mg/dL (ref 0–99)
NONHDL: 45.79
Triglycerides: 47 mg/dL (ref 0.0–149.0)
VLDL: 9.4 mg/dL (ref 0.0–40.0)

## 2015-05-30 LAB — TSH: TSH: 1.47 u[IU]/mL (ref 0.35–4.50)

## 2015-06-08 ENCOUNTER — Encounter: Payer: Self-pay | Admitting: Internal Medicine

## 2015-06-08 ENCOUNTER — Ambulatory Visit (INDEPENDENT_AMBULATORY_CARE_PROVIDER_SITE_OTHER): Payer: 59 | Admitting: Internal Medicine

## 2015-06-08 VITALS — BP 132/92 | Temp 98.4°F | Ht 68.5 in | Wt 273.0 lb

## 2015-06-08 DIAGNOSIS — Z125 Encounter for screening for malignant neoplasm of prostate: Secondary | ICD-10-CM

## 2015-06-08 DIAGNOSIS — Z0000 Encounter for general adult medical examination without abnormal findings: Secondary | ICD-10-CM

## 2015-06-08 DIAGNOSIS — Z23 Encounter for immunization: Secondary | ICD-10-CM | POA: Diagnosis not present

## 2015-06-08 LAB — PSA: PSA: 0.37 ng/mL (ref 0.10–4.00)

## 2015-06-08 NOTE — Patient Instructions (Addendum)
Continue lifestyle intervention healthy eating and exercise . Pay attention to  Healthy eating .   PSA  today with understanding of limitations .  If ok no need to repeat until age 44  Or thereabouts    Prostate-Specific Antigen Test WHY AM I HAVING THIS TEST? The prostate-specific antigen (PSA) test is performed to determine how much PSA you have in your blood. PSA is a type of protein that is normally present in the prostate gland. Certain conditions can cause PSA blood levels to increase, such as:  Infection in the prostate (prostatitis).  Enlargement of the prostate (hypertrophy).  Prostate cancer. Because PSA levels increase greatly from prostate cancer, this test can be used to confirm a diagnosis of prostate cancer. It may also be used to monitor treatment for prostate cancer and to watch for a return of prostate cancer after treatment has finished.  This test has a very high false-positive rate. Therefore, routine PSA screening for all men is no longer recommended. A false-positive result is incorrect because it indicates a condition or finding is present when it is not.  WHAT KIND OF SAMPLE IS TAKEN?  A blood sample is required for this test. It is usually collected by inserting a needle into a vein or by sticking a finger with a small needle. HOW DO I PREPARE FOR THE TEST? There is no preparation required for this test. However, there are factors that can affect the results of a PSA test. To get the most accurate results:  Avoid having a rectal exam within several hours before having your blood drawn for this test.   Avoid having any procedures performed on the prostate gland within 6 weeks of having this test.   Avoid ejaculating within 24 hours of having this test.   Tell your health care provider if you had a recent urinary tract infection (UTI).  Tell your health care provider if you are taking medicines to assist with hair growth, such as finasteride.  Tell your  health care provider if you have been exposed to a medicine called diethylstilbestrol. Let your health care provider know if any of these factors apply to you. You may be asked to reschedule the test. WHAT ARE THE REFERENCE RANGES? Reference ranges are established after testing a large group of people. Reference ranges may vary among different people, labs, and hospitals. It is your responsibility to obtain your test results. Ask the lab or department performing the test when and how you will get your results.  Low: 0-2.5 ng/mL.  Slightly to moderately elevated: 2.6-10.0 ng/mL.  Moderately elevated: 10.0-19.9 ng/mL.  Significantly elevated: 20 ng/mL or greater. WHAT DO THE RESULTS MEAN? PSA test results greater than 4 ng/mL are found in the majority of men with prostate cancer. If your test result is above this level, this can indicate an increased risk for prostate cancer. Increased PSA levels can also indicate other health conditions. Talk with your health care provider to discuss your results, treatment options, and if necessary, the need for more tests. Talk with your health care provider if you have any questions about your results.   This information is not intended to replace advice given to you by your health care provider. Make sure you discuss any questions you have with your health care provider.   Document Released: 08/16/2004 Document Revised: 04/04/2015 Document Reviewed: 12/07/2013 Elsevier Interactive Patient Education [employer].

## 2015-06-08 NOTE — Progress Notes (Signed)
Pre visit review using our clinic review tool, if applicable. No additional management support is needed unless otherwise documented below in the visit note.  Chief Complaint  Patient presents with  . Annual Exam    HPI: Patient  Alfred Johnson  44 y.o. comes in today for Preventive Health Care visit  Diabetes  Not eating as well  Fu Dr Loanne Drilling next week no sx  Wife wanted him to get psa screen no sx ? fam hx  frinds in 38 sdx with prostate cancer   Health Maintenance  Topic Date Due  . PNEUMOCOCCAL POLYSACCHARIDE VACCINE (2) 06/01/2012  . OPHTHALMOLOGY EXAM  05/19/2013  . FOOT EXAM  06/30/2014  . HIV Screening  06/06/2016 (Originally [birth year]1984)  . HEMOGLOBIN A1C  11/27/2015  . INFLUENZA VACCINE  02/26/2016  . URINE MICROALBUMIN  05/29/2016  . TETANUS/TDAP  06/07/2025   Health Maintenance Review LIFESTYLE:  Exercise:  House rehab  Tobacco/ETS:n Alcohol: rare Sugar beverages:n coke zero Sleep:7 hours Drug use: no   ROS:  GEN/ HEENT: No fever, significant weight changes sweats headaches vision problems hearing changes, CV/ PULM; No chest pain shortness of breath cough, syncope,edema  change in exercise tolerance. GI /GU: No adominal pain, vomiting, change in bowel habits. No blood in the stool. No significant GU symptoms. SKIN/HEME: ,no acute skin rashes suspicious lesions or bleeding. No lymphadenopathy, nodules, masses.  NEURO/ PSYCH:  No neurologic signs such as weakness numbness. No depression anxiety. IMM/ Allergy: No unusual infections.  Allergy .   REST of 12 system review negative except as per HPI   Past Medical History  Diagnosis Date  . GERD (gastroesophageal reflux disease)   . Hypertension   . Elevated BP   . Diabetes mellitus   . KIDNEY STONE 02/05/2009  . ANEMIA DUE TO CHRONIC BLOOD LOSS 09/10/2009  . HYPERLIPIDEMIA, MIXED 03/23/2007  . VENTRICULAR FUNCTION, DECREASED 06/21/2007    No past surgical history on file.  Family History  Problem  Relation Age of Onset  . Graves' disease Mother   . Heart attack Maternal Grandfather   . Heart attack Paternal Grandfather   . Diabetes Brother   . HIV Brother   . Heart disease Brother 9    CABG  . Heart attack Father 69    COD  . Heart failure Father   . Kidney disease Father     Social History   Social History  . Marital Status: Married    Spouse Name: N/A  . Number of Children: N/A  . Years of Education: N/A   Occupational History  .      Does not work outside the home   Social History Main Topics  . Smoking status: Former Research scientist (life sciences)  . Smokeless tobacco: Former Systems developer    Quit date: 07/28/1992  . Alcohol Use: No  . Drug Use: No  . Sexual Activity: Not Asked   Other Topics Concern  . 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     Outpatient Prescriptions Prior to Visit  Medication Sig Dispense Refill  . aspirin 81 MG tablet Take 81 mg by mouth daily.      . cetirizine (ZYRTEC) 10 MG tablet Take 10 mg by mouth daily.    Marland Kitchen glucose  blood (ONE TOUCH ULTRA TEST) test strip Check blood sugar 5 to 6 times a day or as directed     . pioglitazone (ACTOS) 45 MG tablet TAKE 1 TABLET BY MOUTH ONCE A DAY 90 tablet 3  . rosuvastatin (CRESTOR) 20 MG tablet Take 1 tablet (20 mg total) by mouth daily. 90 tablet 3  . Saxagliptin-Metformin (KOMBIGLYZE XR) 2.11-998 MG TB24 Take 1 tablet by mouth 2 (two) times daily. 60 tablet 11   No facility-administered medications prior to visit.     EXAM:  BP 132/92 mmHg  Temp(Src) 98.4 F (36.9 C) (Oral)  Ht 5' 8.5" (1.74 m)  Wt 273 lb (123.832 kg)  BMI 40.90 kg/m2  Body mass index is 40.9 kg/(m^2).  Physical Exam: Vital signs reviewed [id number] is a well-developed well-nourished alert cooperative    who appearsr stated age in no acute distress.  HEENT: normocephalic atraumatic  , Eyes: PERRL EOM's full, conjunctiva clear, Nares: paten,t no deformity discharge or tenderness., Ears: no deformity EAC's clear TMs with normal landmarks. Mouth: clear OP, no lesions, edema.  Moist mucous membranes. Dentition in adequate repair. NECK: supple without masses, thyromegaly or bruits. CHEST/PULM:  Clear to auscultation and percussion breath sounds equal no wheeze , rales or rhonchi. No chest wall deformities or tenderness. CV: PMI is nondisplaced, S1 S2 no gallops, murmurs, rubs. Peripheral pulses are full without delay.No JVD .  ABDOMEN: Bowel sounds normal nontender  No guard or rebound, no hepato splenomegal no CVA tenderness.  No hernia. Extremtities:  No clubbing cyanosis or edema, no acute joint swelling or redness no focal atrophy NEURO:  Oriented x3, cranial nerves 3-12 appear to be intact, no obvious focal weakness,gait within normal limits no abnormal reflexes or asymmetrical SKIN: No acute rashes normal turgor, color, no bruising or petechiae. PSYCH: Oriented, good eye contact, no obvious depression anxiety, cognition and judgment appear normal. LN: no cervical axillary inguinal adenopathy Diabetic Foot Exam - Simple   Simple Foot Form  Diabetic Foot exam was performed with the following findings:  Yes 06/08/2015 10:44 AM  Visual Inspection  No deformities, no ulcerations, no other skin breakdown bilaterally:  Yes  Sensation Testing  Intact to touch and monofilament testing bilaterally:  Yes  Pulse Check  Posterior Tibialis and Dorsalis pulse intact bilaterally:  Yes  Comments      Lab Results  Component Value Date   WBC 6.4 05/30/2015   HGB 14.2 05/30/2015   HCT 43.2 05/30/2015   PLT 255.0 05/30/2015   GLUCOSE 166* 05/30/2015   CHOL 86 05/30/2015   TRIG 47.0 05/30/2015   HDL 40.40 05/30/2015   LDLDIRECT 114.5 02/26/2007   LDLCALC 36 05/30/2015   ALT 23 05/30/2015   AST 20 05/30/2015   NA 141 05/30/2015   K 4.8 05/30/2015   CL 105 05/30/2015    CREATININE 0.85 05/30/2015   BUN 13 05/30/2015   CO2 31 05/30/2015   TSH 1.47 05/30/2015   HGBA1C 6.9* 05/30/2015   MICROALBUR 0.9 05/30/2015    ASSESSMENT AND PLAN:  Discussed the following assessment and plan:  Visit for preventive health examination - diabetes  per endo  - Plan: PSA  Need for prophylactic vaccination and inoculation against influenza - Plan: Flu Vaccine QUAD 36+ mos PF IM (Fluarix & Fluzone Quad PF)  Need for Tdap vaccination - Plan: Tdap vaccine greater than or equal to 7yo IM  Screening PSA (prostate specific antigen) - guidlines idsc risk benefit  will do baseline today and then at  46 can recheck if still wishes screening - Plan: PSA  Patient Care Team: Burnis Medin, MD as PCP - General Renato Shin, MD as Attending Physician (Internal Medicine) Calvert Cantor, MD (Ophthalmology) Hayden Pedro, MD as Consulting Physician (Ophthalmology) Patient Instructions  Continue lifestyle intervention healthy eating and exercise . Pay attention to  Healthy eating .   PSA  today with understanding of limitations .  If ok no need to repeat until age 7  Or thereabouts    Prostate-Specific Antigen Test WHY AM I HAVING THIS TEST? The prostate-specific antigen (PSA) test is performed to determine how much PSA you have in your blood. PSA is a type of protein that is normally present in the prostate gland. Certain conditions can cause PSA blood levels to increase, such as:  Infection in the prostate (prostatitis).  Enlargement of the prostate (hypertrophy).  Prostate cancer. Because PSA levels increase greatly from prostate cancer, this test can be used to confirm a diagnosis of prostate cancer. It may also be used to monitor treatment for prostate cancer and to watch for a return of prostate cancer after treatment has finished.  This test has a very high false-positive rate. Therefore, routine PSA screening for all men is no longer recommended. A false-positive result  is incorrect because it indicates a condition or finding is present when it is not.  WHAT KIND OF SAMPLE IS TAKEN?  A blood sample is required for this test. It is usually collected by inserting a needle into a vein or by sticking a finger with a small needle. HOW DO I PREPARE FOR THE TEST? There is no preparation required for this test. However, there are factors that can affect the results of a PSA test. To get the most accurate results:  Avoid having a rectal exam within several hours before having your blood drawn for this test.   Avoid having any procedures performed on the prostate gland within 6 weeks of having this test.   Avoid ejaculating within 24 hours of having this test.   Tell your health care provider if you had a recent urinary tract infection (UTI).  Tell your health care provider if you are taking medicines to assist with hair growth, such as finasteride.  Tell your health care provider if you have been exposed to a medicine called diethylstilbestrol. Let your health care provider know if any of these factors apply to you. You may be asked to reschedule the test. WHAT ARE THE REFERENCE RANGES? Reference ranges are established after testing a large group of people. Reference ranges may vary among different people, labs, and hospitals. It is your responsibility to obtain your test results. Ask the lab or department performing the test when and how you will get your results.  Low: 0-2.5 ng/mL.  Slightly to moderately elevated: 2.6-10.0 ng/mL.  Moderately elevated: 10.0-19.9 ng/mL.  Significantly elevated: 20 ng/mL or greater. WHAT DO THE RESULTS MEAN? PSA test results greater than 4 ng/mL are found in the majority of men with prostate cancer. If your test result is above this level, this can indicate an increased risk for prostate cancer. Increased PSA levels can also indicate other health conditions. Talk with your health care provider to discuss your results,  treatment options, and if necessary, the need for more tests. Talk with your health care provider if you have any questions about your results.   This information is not intended to replace advice given to you by your health care provider. Make  sure you discuss any questions you have with your health care provider.   Document Released: 08/16/2004 Document Revised: 04/04/2015 Document Reviewed: 12/07/2013 Elsevier Interactive Patient Education 2016 [place] K. Mohamedamin Nifong M.D.

## 2015-06-11 ENCOUNTER — Encounter: Payer: Self-pay | Admitting: Endocrinology

## 2015-06-11 ENCOUNTER — Telehealth: Payer: Self-pay | Admitting: Endocrinology

## 2015-06-11 ENCOUNTER — Ambulatory Visit (INDEPENDENT_AMBULATORY_CARE_PROVIDER_SITE_OTHER): Payer: 59 | Admitting: Endocrinology

## 2015-06-11 VITALS — BP 137/87 | HR 86 | Temp 98.1°F | Ht 68.5 in | Wt 274.0 lb

## 2015-06-11 DIAGNOSIS — E119 Type 2 diabetes mellitus without complications: Secondary | ICD-10-CM | POA: Diagnosis not present

## 2015-06-11 MED ORDER — CANAGLIFLOZIN 300 MG PO TABS: 300.0000 mg | ORAL_TABLET | Freq: Every day | ORAL | Status: DC

## 2015-06-11 MED ORDER — DAPAGLIFLOZIN PROPANEDIOL 5 MG PO TABS: 5.0000 mg | ORAL_TABLET | Freq: Every day | ORAL | Status: DC

## 2015-06-11 NOTE — Telephone Encounter (Signed)
please call patient: Ins prefers invokana rather than farxiga i have sent a prescription to your pharmacy

## 2015-06-11 NOTE — Patient Instructions (Addendum)
check your blood sugar once a day.  vary the time of day when you check, between before the 3 meals, and at bedtime.  also check if you have symptoms of your blood sugar being too high or too low.  please keep a record of the readings and bring it to your next appointment here.  You can write it on any piece of paper.  please call us sooner if your blood sugar goes below 70, or if you have a lot of readings over 200. Please come back for a follow-up appointment in 1 year.   i have sent a prescription to your pharmacy, to add "farixiga."   Please redo blood tests in 3 months.

## 2015-06-11 NOTE — Progress Notes (Signed)
Subjective:    Patient ID: Alfred Johnson, male    DOB: 1970/08/11, 44 y.o.   MRN: [id number]  HPI Pt returns for f/u of diabetes mellitus: DM type: 2 Dx'ed: [id number] Complications: none Therapy: 3 oral meds DKA: never Severe hypoglycemia: never Pancreatitis: never Other: he has never been on insulin; he declines weight loss surgery. Interval history: pt states he feels well in general.  He says cbg's are well-controlled.  Past Medical History  Diagnosis Date  . GERD (gastroesophageal reflux disease)   . Hypertension   . Elevated BP   . Diabetes mellitus   . KIDNEY STONE 02/05/2009  . ANEMIA DUE TO CHRONIC BLOOD LOSS 09/10/2009  . HYPERLIPIDEMIA, MIXED 03/23/2007  . VENTRICULAR FUNCTION, DECREASED 06/21/2007    No past surgical history on file.  Social History   Social History  . Marital Status: Married    Spouse Name: N/A  . Number of Children: N/A  . Years of Education: N/A   Occupational History  .      Does not work outside the home   Social History Main Topics  . Smoking status: Former Research scientist (life sciences)  . Smokeless tobacco: Former Systems developer    Quit date: 07/28/1992  . Alcohol Use: No  . Drug Use: No  . Sexual Activity: Not on file   Other Topics Concern  . 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     Current Outpatient Prescriptions on File Prior to Visit  Medication Sig Dispense Refill  . aspirin 81 MG tablet Take 81 mg by mouth daily.      . cetirizine (ZYRTEC) 10 MG tablet Take 10 mg by mouth daily.    Marland Kitchen glucose blood (ONE TOUCH ULTRA TEST) test strip Check blood sugar 5 to 6 times a day or as directed     . pioglitazone (ACTOS) 45 MG tablet TAKE 1 TABLET BY MOUTH ONCE A DAY 90 tablet 3  . Probiotic Product (PROBIOTIC PO) Take by mouth.    . rosuvastatin (CRESTOR) 20 MG tablet Take 1 tablet (20 mg  total) by mouth daily. 90 tablet 3  . Saxagliptin-Metformin (KOMBIGLYZE XR) 2.11-998 MG TB24 Take 1 tablet by mouth 2 (two) times daily. 60 tablet 11   No current facility-administered medications on file prior to visit.    No Known Allergies  Family History  Problem Relation Age of Onset  . Graves' disease Mother   . Heart attack Maternal Grandfather   . Heart attack Paternal Grandfather   . Diabetes Brother   . HIV Brother   . Heart disease Brother 57    CABG  . Heart attack Father 58    COD  . Heart failure Father   . Kidney disease Father     BP 137/87 mmHg  Pulse 86  Temp(Src) 98.1 F (36.7 C) (Oral)  Ht 5' 8.5" (1.74 m)  Wt 274 lb (124.286 kg)  BMI 41.05 kg/m2  SpO2 96%  Review of Systems He denies hypoglycemia    Objective:   Physical Exam VITAL SIGNS:  See vs page GENERAL: no distress Pulses: dorsalis pedis intact bilat.   MSK: no deformity of the feet CV: no leg edema Skin:  no ulcer on the feet.  normal  color and temp on the feet. Neuro: sensation is intact to touch on the feet. Lab Results  Component Value Date   CREATININE 0.85 05/30/2015   BUN 13 05/30/2015   NA 141 05/30/2015   K 4.8 05/30/2015   CL 105 05/30/2015   CO2 31 05/30/2015   Lab Results  Component Value Date   HGBA1C 6.9* 05/30/2015      Assessment & Plan:  DM: Needs increased rx, if it can be done with a regimen that avoids or minimizes hypoglycemia.    Patient is advised the following: Patient Instructions  check your blood sugar once a day.  vary the time of day when you check, between before the 3 meals, and at bedtime.  also check if you have symptoms of your blood sugar being too high or too low.  please keep a record of the readings and bring it to your next appointment here.  You can write it on any piece of paper.  please call us sooner if your blood sugar goes below 70, or if you have a lot of readings over 200. Please come back for a follow-up appointment in 1 year.     i have sent a prescription to your pharmacy, to add "farixiga."   Please redo blood tests in 3 months.

## 2015-06-12 NOTE — Telephone Encounter (Signed)
Patient advised of note below and voiced understanding.  

## 2015-06-15 ENCOUNTER — Telehealth: Payer: Self-pay | Admitting: Endocrinology

## 2015-06-15 NOTE — Telephone Encounter (Signed)
Patient need Prior Auth for medication Invokana.

## 2015-06-15 NOTE — Telephone Encounter (Signed)
I contacted the pt's pharmacy and advised we have gotten this medication approved through his insurance.

## 2015-06-18 ENCOUNTER — Other Ambulatory Visit: Payer: Self-pay | Admitting: Endocrinology

## 2015-06-19 ENCOUNTER — Telehealth: Payer: Self-pay | Admitting: Endocrinology

## 2015-06-19 MED ORDER — PIOGLITAZONE HCL 45 MG PO TABS: 45.0000 mg | ORAL_TABLET | Freq: Every day | ORAL | Status: DC

## 2015-06-19 NOTE — Telephone Encounter (Signed)
Rx resubmitted

## 2015-06-19 NOTE — Telephone Encounter (Signed)
Please resend actos rx to [facility]! They had issues with computers yesterday

## 2015-09-01 ENCOUNTER — Other Ambulatory Visit: Payer: Self-pay | Admitting: Endocrinology

## 2015-09-11 ENCOUNTER — Other Ambulatory Visit: Payer: 59

## 2015-09-11 ENCOUNTER — Telehealth: Payer: Self-pay | Admitting: Internal Medicine

## 2015-09-11 MED ORDER — ROSUVASTATIN CALCIUM 20 MG PO TABS: 20.0000 mg | ORAL_TABLET | Freq: Every day | ORAL | Status: DC

## 2015-09-11 NOTE — Telephone Encounter (Signed)
New Message  Pt c/o medication issue: 1. Name of Medication: Crestor   4. What is your medication issue? To expensive. Requesting the generic. If this is ok please [facility] and please complete a coutesy call to the pt letting him know that it was called in

## 2015-09-11 NOTE — Telephone Encounter (Signed)
Spoke with pt, he would like to change to atorvastatin, it would be cheaper for him. Will forward to dr hilty for dosing.

## 2015-09-12 NOTE — Telephone Encounter (Signed)
Lipitor 40mg  is the equivalent. Please Rx.  Thanks.  Dr. Lemmie Evens

## 2015-09-14 MED ORDER — ATORVASTATIN CALCIUM 40 MG PO TABS: 40.0000 mg | ORAL_TABLET | Freq: Every day | ORAL | Status: DC

## 2015-09-14 NOTE — Telephone Encounter (Signed)
Spoke with pt, New script sent to the pharmacy  

## 2015-10-01 ENCOUNTER — Other Ambulatory Visit: Payer: Self-pay | Admitting: Endocrinology

## 2015-11-13 ENCOUNTER — Other Ambulatory Visit (INDEPENDENT_AMBULATORY_CARE_PROVIDER_SITE_OTHER): Payer: 59

## 2015-11-13 DIAGNOSIS — E119 Type 2 diabetes mellitus without complications: Secondary | ICD-10-CM

## 2015-11-13 LAB — BASIC METABOLIC PANEL
BUN: 16 mg/dL (ref 6–23)
CO2: 28 mEq/L (ref 19–32)
Calcium: 9.8 mg/dL (ref 8.4–10.5)
Chloride: 104 mEq/L (ref 96–112)
Creatinine, Ser: 0.96 mg/dL (ref 0.40–1.50)
GFR: 89.38 mL/min (ref >60.00)
GLUCOSE: 143 mg/dL — AB (ref 70–99)
POTASSIUM: 4.7 meq/L (ref 3.5–5.1)
SODIUM: 139 meq/L (ref 135–145)

## 2015-11-13 LAB — HEMOGLOBIN A1C: HEMOGLOBIN A1C: 7.2 % — AB (ref 4.6–6.5)

## 2015-12-07 ENCOUNTER — Telehealth: Payer: Self-pay | Admitting: Internal Medicine

## 2015-12-07 NOTE — Telephone Encounter (Signed)
Left message with wife for patient to call and schedule 1 year follow up with Dr. Debara Pickett.

## 2015-12-25 ENCOUNTER — Other Ambulatory Visit: Payer: Self-pay | Admitting: Endocrinology

## 2016-01-22 ENCOUNTER — Telehealth: Payer: Self-pay | Admitting: Internal Medicine

## 2016-01-22 NOTE — Telephone Encounter (Signed)
Spoke with patient and he has CT scheduled for 01/28/16 but would like to see Dr Debara Pickett to discuss before having secondary to cost I cancelled as requested and will forward to Dr Debara Pickett so he will be aware

## 2016-01-22 NOTE — Telephone Encounter (Signed)
Pt calling wanting to cancel CT-it will be out of pocket about $1000 and feels it was just preventative for some nodules he has that he was more concerned about than Hilty, so he thinks that's why it was ordered for his peace of mind-still wants to keep ov 02-11-16 but is CT needed? pls advise

## 2016-01-22 NOTE — Telephone Encounter (Signed)
Ok .. He is a former smoker - it is possible these nodules can be early cancer, that is why a repeat CT is recommended. We will review - he will likely only need a limited CT for nodules, I think that is cheaper.  Dr. Lemmie Evens

## 2016-01-28 ENCOUNTER — Inpatient Hospital Stay: Admission: RE | Admit: 2016-01-28 | Payer: 59 | Source: Ambulatory Visit

## 2016-02-11 ENCOUNTER — Encounter: Payer: Self-pay | Admitting: Internal Medicine

## 2016-02-11 ENCOUNTER — Ambulatory Visit (INDEPENDENT_AMBULATORY_CARE_PROVIDER_SITE_OTHER): Payer: 59 | Admitting: Internal Medicine

## 2016-02-11 VITALS — BP 136/88 | HR 88 | Ht 70.0 in | Wt 274.2 lb

## 2016-02-11 DIAGNOSIS — E669 Obesity, unspecified: Secondary | ICD-10-CM | POA: Insufficient documentation

## 2016-02-11 DIAGNOSIS — I10 Essential (primary) hypertension: Secondary | ICD-10-CM | POA: Diagnosis not present

## 2016-02-11 DIAGNOSIS — E119 Type 2 diabetes mellitus without complications: Secondary | ICD-10-CM

## 2016-02-11 DIAGNOSIS — E782 Mixed hyperlipidemia: Secondary | ICD-10-CM

## 2016-02-11 DIAGNOSIS — R911 Solitary pulmonary nodule: Secondary | ICD-10-CM

## 2016-02-11 DIAGNOSIS — E1169 Type 2 diabetes mellitus with other specified complication: Secondary | ICD-10-CM | POA: Insufficient documentation

## 2016-02-11 NOTE — Progress Notes (Signed)
OFFICE NOTE  Chief Complaint:  Follow-up studies  Primary Care Physician: Lottie Dawson, MD  HPI:  Alfred Johnson is a pleasant 45 year old male who is coming referred to me by Dr. Regis Bill for evaluation of coronary disease. His past history significant for diabetes followed by Dr. Loanne Drilling in endocrinology. He does not have hypertension and is not on treatment for any dyslipidemia. His last lipid profile showed an LDL cholesterol 95 with total cholesterol less than 160. He does have a strong family history of heart disease with heart attacks in his father and both grandfathers. He also recently had a brother who had 3 vessel, bypass one month ago. Since that time he's been having some intermittent symptoms with vague, atypical sounding chest pain and occasional lightheadedness. He did have a pneumonia last summer. Fortunately, he is very active. He's been doing significant exercise several days a week including both aerobic and resistance training. He's managed to have no symptoms such as chest pain or worsening shortness of breath during these exercises.  Alfred Johnson returns today for follow-up. He underwent a CT coronary artery calcium score. This resulted back is 0. Unfortunately he was noted to have some small pulmonary nodules. He will need a repeat plain chest CT in one year. He also obtained a cholesterol profile which demonstrated an LDL-P of 1566, LDL-C of 117, HDL 45, triglycerides 64 and total cholesterol 175. Although he has no chronic calcium, the diabetic he should have risk factor modification. We talked about cholesterol medications and he did research with his insurance company as to what medications would be the most cost effective. He is interested in taking Crestor which I think is a good medication.  02/11/2016  Alfred Johnson was seen today in follow-up. Over the past year he's done fairly well. His blood sugars however have been elevated and recently he was started on  Invokana in addition to his other diabetes medicines. Blood pressure appears well controlled today. He denies any chest pain or worsening shortness of breath. Of note his PCP recheck his cholesterol November 2016 which showed a total cholesterol of 86, triglycerides 47, HDL 40 and LDL of 36. This represents significant cholesterol control.  PMHx:  Past Medical History  Diagnosis Date  . GERD (gastroesophageal reflux disease)   . Hypertension   . Elevated BP   . Diabetes mellitus   . KIDNEY STONE 02/05/2009  . ANEMIA DUE TO CHRONIC BLOOD LOSS 09/10/2009  . HYPERLIPIDEMIA, MIXED 03/23/2007  . VENTRICULAR FUNCTION, DECREASED 06/21/2007    No past surgical history on file.  FAMHx:  Family History  Problem Relation Age of Onset  . Graves' disease Mother   . Heart attack Maternal Grandfather   . Heart attack Paternal Grandfather   . Diabetes Brother   . HIV Brother   . Heart disease Brother 49    CABG  . Heart attack Father 43    COD  . Heart failure Father   . Kidney disease Father     SOCHx:   reports that he has quit smoking. He quit smokeless tobacco use about 23 years ago. He reports that he does not drink alcohol or use illicit drugs.  ALLERGIES:  No Known Allergies  ROS: A comprehensive review of systems was negative.  HOME MEDS: Current Outpatient Prescriptions  Medication Sig Dispense Refill  . aspirin 81 MG tablet Take 81 mg by mouth daily.      Marland Kitchen atorvastatin (LIPITOR) 40 MG tablet Take 40 mg by  mouth daily. HOLD for 2 weeks.    . canagliflozin (INVOKANA) 300 MG TABS tablet Take 300 mg by mouth daily. 30 tablet 11  . cetirizine (ZYRTEC) 10 MG tablet Take 10 mg by mouth daily.    Marland Kitchen glucose blood (ONE TOUCH ULTRA TEST) test strip Check blood sugar 5 to 6 times a day or as directed     . KOMBIGLYZE XR 2.11-998 MG TB24 TAKE ONE TABLET BY MOUTH TWICE DAILY 60 tablet 11  . pioglitazone (ACTOS) 45 MG tablet TAKE ONE TABLET BY MOUTH ONCE DAILY 90 tablet 0   No current  facility-administered medications for this visit.    LABS/IMAGING: No results found for this or any previous visit (from the past 48 hour(s)). No results found.  WEIGHTS: Wt Readings from Last 3 Encounters:  02/11/16 274 lb 3.2 oz (124.376 kg)  06/11/15 274 lb (124.286 kg)  06/08/15 273 lb (123.832 kg)    VITALS: BP 136/88 mmHg  Pulse 88  Ht 5\' 10"  (1.778 m)  Wt 274 lb 3.2 oz (124.376 kg)  BMI 39.34 kg/m2  EXAM: General appearance: alert and no distress Neck: no carotid bruit and no JVD Lungs: clear to auscultation bilaterally Heart: regular rate and rhythm, S1, S2 normal, no murmur, click, rub or gallop Abdomen: soft, non-tender; bowel sounds normal; no masses,  no organomegaly Extremities: extremities normal, atraumatic, no cyanosis or edema Pulses: 2+ and symmetric Skin: Skin color, texture, turgor normal. No rashes or lesions Neurologic: Grossly normal Psych: Pleasant  EKG: Normal sinus rhythm at 88, voltage criteria for LVH   ASSESSMENT: 1. Atypical chest pain - No coronary artery calcium 2. Strong family history of coronary disease 3. Type 2 diabetes 4. Obesity 5. Dyslipidemia 6. Solitary pulmonary nodule  PLAN: 1.   Alfred Johnson fortunately has no coronary artery calcium. This is very reassuring. As he is a diabetic, his cholesterol targets are lower. I think he benefit from statin therapy. I recommended Crestor 20 mg daily however he went on to eventually Lipitor 40 mg daily. Looking at his cholesterol profile it is quite low at this time. He is also complaining of some muscle soreness which could be related to the statins. I asked him to stop his Lipitor for 2 weeks to see if his symptoms improve. If there is no change she should resume his current statin dose otherwise he could resume the 20 mg Lipitor dose and still have adequate cholesterol control. Finally, he is due for recheck of a solitary pulmonary nodule which was seen on coronary calcium scan. I will  order that today and contacted with those results.  Follow-up annually or sooner as necessary.  Pixie Casino, MD, [facility] Attending Cardiologist [place] [facility] Hilty 02/11/2016, 6:01 PM

## 2016-02-11 NOTE — Patient Instructions (Addendum)
Non-Cardiac CT scanning [id number]), (CAT scanning), is a noninvasive, special x-ray that produces cross-sectional images of the body using x-rays and a computer. CT scans help physicians diagnose and treat medical conditions. For some CT exams, a contrast material is used to enhance visibility in the area of the body being studied. CT scans provide greater clarity and reveal more details than regular x-ray exams. -- this test is to reassess lung nodules  Dr. Debara Pickett has advised you to HOLD your atorvastatin for 2 weeks Please call our office with an update on how you are feeling  Your physician wants you to follow-up in: 12 months with Dr. Debara Pickett. You will receive a reminder letter in the mail two months in advance. If you don't receive a letter, please call our office to schedule the follow-up appointment.

## 2016-02-25 ENCOUNTER — Telehealth: Payer: Self-pay | Admitting: Internal Medicine

## 2016-02-25 NOTE — Telephone Encounter (Signed)
Spoke with [facility] - the order in [facility] for lung nodule follow up is only for lung cancer follow up, thus this is not applicable to patient. Correct order for lung nodule follow up with CT chest w/o contrast. [facility] will change order in [facility] and MD will co-sign.

## 2016-02-27 ENCOUNTER — Ambulatory Visit
Admission: RE | Admit: 2016-02-27 | Discharge: 2016-02-27 | Disposition: A | Discharge status: Home / Self Care | Payer: 59 | Source: Ambulatory Visit | Attending: Internal Medicine | Admitting: Internal Medicine

## 2016-02-27 ENCOUNTER — Telehealth: Payer: Self-pay | Admitting: Internal Medicine

## 2016-02-27 DIAGNOSIS — R911 Solitary pulmonary nodule: Secondary | ICD-10-CM

## 2016-02-27 NOTE — Telephone Encounter (Signed)
New message     Call report on Cat scan

## 2016-02-27 NOTE — Telephone Encounter (Signed)
Radiology called to confirm CT viewable in Epic Will forward to Dr Debara Pickett so he can review

## 2016-02-29 NOTE — Telephone Encounter (Signed)
Reviewed CT scan and sent a myChart message to Mr. Forbes today.  Dr. Lemmie Evens

## 2016-04-01 ENCOUNTER — Other Ambulatory Visit: Payer: Self-pay | Admitting: Endocrinology

## 2016-06-05 ENCOUNTER — Other Ambulatory Visit: Payer: 59

## 2016-06-08 NOTE — Progress Notes (Signed)
Subjective:    Patient ID: Alfred Johnson, male    DOB: 1971/05/02, 45 y.o.   MRN: [id number]  HPI Pt returns for f/u of diabetes mellitus: DM type: 2 Dx'ed: [id number] Complications: none Therapy: 4 oral meds DKA: never Severe hypoglycemia: never Pancreatitis: never Other: he has never been on insulin; he declines weight loss surgery. Interval history: pt states he feels well in general.  He says cbg's are well-controlled. He stopped invokana, due to itching. sxs stopped off the med Past Medical History:  Diagnosis Date  . ANEMIA DUE TO CHRONIC BLOOD LOSS 09/10/2009  . Diabetes mellitus   . Elevated BP   . GERD (gastroesophageal reflux disease)   . HYPERLIPIDEMIA, MIXED 03/23/2007  . Hypertension   . KIDNEY STONE 02/05/2009  . VENTRICULAR FUNCTION, DECREASED 06/21/2007    No past surgical history on file.  Social History   Social History  . Marital status: Married    Spouse name: N/A  . Number of children: N/A  . Years of education: N/A   Occupational History  .  Self-Employed    Does not work outside the home   Social History Main Topics  . Smoking status: Former Research scientist (life sciences)  . Smokeless tobacco: Former Systems developer    Quit date: 07/28/1992  . Alcohol use No  . Drug use: No  . Sexual activity: Not on file   Other Topics Concern  . 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     Current Outpatient Prescriptions on File Prior to Visit  Medication Sig Dispense Refill  . aspirin 81 MG tablet Take 81 mg by mouth daily.      . cetirizine (ZYRTEC) 10 MG tablet Take 10 mg by mouth daily.    Marland Kitchen glucose blood (ONE TOUCH ULTRA TEST) test strip Check blood sugar 5 to 6 times a day or as directed     . KOMBIGLYZE XR 2.11-998 MG TB24 TAKE ONE TABLET BY MOUTH TWICE DAILY 60 tablet 11  . pioglitazone (ACTOS) 45 MG tablet TAKE  ONE TABLET BY MOUTH ONCE DAILY 90 tablet 0  . atorvastatin (LIPITOR) 40 MG tablet Take 40 mg by mouth daily. Take 1/2 20 mg daily     No current facility-administered medications on file prior to visit.     No Known Allergies  Family History  Problem Relation Age of Onset  . Graves' disease Mother   . Heart attack Maternal Grandfather   . Heart attack Paternal Grandfather   . Diabetes Brother   . HIV Brother   . Heart disease Brother 52    CABG  . Heart attack Father 79    COD  . Heart failure Father   . Kidney disease Father     BP (!) 146/88   Pulse 88   Ht 5\' 10"  (1.778 m)   Wt 273 lb (123.8 kg)   SpO2 97%   BMI 39.17 kg/m    Review of Systems He denies hypoglycemia    Objective:   Physical Exam VITAL SIGNS:  See vs page GENERAL: no distress Pulses: dorsalis pedis intact bilat.   MSK: no deformity of the feet.   CV: no leg edema.   Skin:  no ulcer on the feet.  normal color and temp on the feet. Neuro: sensation is intact to touch on the feet.    A1c=6.5%    Assessment & Plan:  Type 2 DM: Needs increased rx, if it can be done with a regimen that avoids or minimizes hypoglycemia.   Patient is advised the following: Patient Instructions  check your blood sugar once a day.  vary the time of day when you check, between before the 3 meals, and at bedtime.  also check if you have symptoms of your blood sugar being too high or too low.  please keep a record of the readings and bring it to your next appointment here.  You can write it on any piece of paper.  please call us sooner if your blood sugar goes below 70, or if you have a lot of readings over 200. I have sent a prescription to your pharmacy, to add "jardiance."  Please come back for a follow-up appointment in 6 months.

## 2016-06-10 ENCOUNTER — Encounter: Payer: Self-pay | Admitting: Endocrinology

## 2016-06-10 ENCOUNTER — Ambulatory Visit (INDEPENDENT_AMBULATORY_CARE_PROVIDER_SITE_OTHER): Payer: 59 | Admitting: Endocrinology

## 2016-06-10 VITALS — BP 146/88 | HR 88 | Ht 70.0 in | Wt 273.0 lb

## 2016-06-10 DIAGNOSIS — E119 Type 2 diabetes mellitus without complications: Secondary | ICD-10-CM

## 2016-06-10 LAB — POCT GLYCOSYLATED HEMOGLOBIN (HGB A1C): Hemoglobin A1C: 6.5

## 2016-06-10 MED ORDER — EMPAGLIFLOZIN 25 MG PO TABS: 25.0000 mg | ORAL_TABLET | Freq: Every day | ORAL | 11 refills | Status: DC

## 2016-06-10 NOTE — Patient Instructions (Addendum)
check your blood sugar once a day.  vary the time of day when you check, between before the 3 meals, and at bedtime.  also check if you have symptoms of your blood sugar being too high or too low.  please keep a record of the readings and bring it to your next appointment here.  You can write it on any piece of paper.  please call us sooner if your blood sugar goes below 70, or if you have a lot of readings over 200. I have sent a prescription to your pharmacy, to add "jardiance."  Please come back for a follow-up appointment in 6 months.

## 2016-06-25 ENCOUNTER — Telehealth: Payer: Self-pay | Admitting: Endocrinology

## 2016-06-25 MED ORDER — CANAGLIFLOZIN 300 MG PO TABS: 300.0000 mg | ORAL_TABLET | Freq: Every day | ORAL | 11 refills | Status: DC

## 2016-06-25 MED ORDER — EMPAGLIFLOZIN 25 MG PO TABS: 25.0000 mg | ORAL_TABLET | Freq: Every day | ORAL | 11 refills | Status: DC

## 2016-06-25 NOTE — Telephone Encounter (Signed)
Ok, I have sent a prescription to your pharmacy 

## 2016-06-25 NOTE — Telephone Encounter (Signed)
Pt needs jardiance PA status please

## 2016-06-25 NOTE — Telephone Encounter (Signed)
I contacted the patient and he advised Invokana caused abdominal pain and and severe itching and he could not take it. Please advise, Thanks!

## 2016-06-25 NOTE — Telephone Encounter (Signed)
Ok, i'll do PA for [employer]

## 2016-06-25 NOTE — Telephone Encounter (Signed)
Alfred Johnson is no covered under the patient's insurance cost alternative would be Invokana. Please advise, Thanks!

## 2016-06-25 NOTE — Telephone Encounter (Signed)
FYI [facility]ard[facility]

## 2016-06-26 NOTE — Telephone Encounter (Signed)
done

## 2016-06-26 NOTE — Telephone Encounter (Signed)
I contacted the patient and advised PA has been submitted. Patient voiced understanding and had no further questions at this time. Patient advised he would be contacted once a determination has been received from the insurance company.

## 2016-06-26 NOTE — Telephone Encounter (Signed)
PA placed on your desk to review and complete.

## 2016-06-28 ENCOUNTER — Other Ambulatory Visit: Payer: Self-pay | Admitting: Endocrinology

## 2016-07-28 DIAGNOSIS — Z7689 Persons encountering health services in other specified circumstances: Secondary | ICD-10-CM

## 2016-08-20 ENCOUNTER — Other Ambulatory Visit: Payer: Self-pay | Admitting: Endocrinology

## 2016-08-25 ENCOUNTER — Telehealth: Payer: Self-pay | Admitting: Endocrinology

## 2016-08-25 NOTE — Telephone Encounter (Signed)
Need another alternative KOMBIGLYZE XR 2.11-998 MG TB24   please advise  [facility] [id number], [place] [phone] (Phone) [phone] (Fax)

## 2016-08-26 MED ORDER — SITAGLIP PHOS-METFORMIN HCL ER 50-1000 MG PO TB24: 2.0000 | ORAL_TABLET | Freq: Every day | ORAL | 11 refills | Status: DC

## 2016-08-26 MED ORDER — SITAGLIPTIN PHOS-METFORMIN HCL 50-1000 MG PO TABS: 2.0000 | ORAL_TABLET | Freq: Every day | ORAL | 11 refills | Status: DC

## 2016-08-26 NOTE — Telephone Encounter (Signed)
Let's try janumet.  I have sent a prescription to your pharmacy

## 2016-08-26 NOTE — Telephone Encounter (Signed)
See message and please advise, Thanks!  

## 2016-08-26 NOTE — Telephone Encounter (Signed)
I contacted the patient and advised of message via voicemail. Requested a call back if the patient would like to discuss further.  

## 2016-09-22 ENCOUNTER — Other Ambulatory Visit: Payer: Self-pay | Admitting: Internal Medicine

## 2016-09-23 ENCOUNTER — Other Ambulatory Visit: Payer: Self-pay

## 2016-09-23 MED ORDER — PIOGLITAZONE HCL 45 MG PO TABS: 45.0000 mg | ORAL_TABLET | Freq: Every day | ORAL | 1 refills | Status: DC

## 2016-12-08 ENCOUNTER — Ambulatory Visit (INDEPENDENT_AMBULATORY_CARE_PROVIDER_SITE_OTHER): Payer: 59 | Admitting: Endocrinology

## 2016-12-08 ENCOUNTER — Encounter: Payer: Self-pay | Admitting: Endocrinology

## 2016-12-08 VITALS — BP 124/86 | HR 66 | Ht 70.0 in | Wt 275.0 lb

## 2016-12-08 DIAGNOSIS — E119 Type 2 diabetes mellitus without complications: Secondary | ICD-10-CM | POA: Diagnosis not present

## 2016-12-08 LAB — POCT GLYCOSYLATED HEMOGLOBIN (HGB A1C): Hemoglobin A1C: 6.9

## 2016-12-08 MED ORDER — BROMOCRIPTINE MESYLATE 2.5 MG PO TABS: ORAL_TABLET | ORAL | 11 refills | Status: DC

## 2016-12-08 MED ORDER — SITAGLIP PHOS-METFORMIN HCL ER 50-1000 MG PO TB24: 2.0000 | ORAL_TABLET | Freq: Every day | ORAL | 11 refills | Status: DC

## 2016-12-08 NOTE — Progress Notes (Signed)
Subjective:    Patient ID: Alfred Johnson, male    DOB: 1971/04/20, 46 y.o.   MRN: [id number]  HPI Pt returns for f/u of diabetes mellitus: DM type: 2 Dx'ed: 6010 Complications: none Therapy: 4 oral meds DKA: never Severe hypoglycemia: never Pancreatitis: never Other: he has never been on insulin; he declines weight loss surgery; he did not tolerate invokana (itching)  Interval history: He says cbg's are in the mid-100's.  pt states he feels well in general.   Past Medical History:  Diagnosis Date  . ANEMIA DUE TO CHRONIC BLOOD LOSS 09/10/2009  . Diabetes mellitus   . Elevated BP   . GERD (gastroesophageal reflux disease)   . HYPERLIPIDEMIA, MIXED 03/23/2007  . Hypertension   . KIDNEY STONE 02/05/2009  . VENTRICULAR FUNCTION, DECREASED 06/21/2007    No past surgical history on file.  Social History   Social History  . Marital status: Married    Spouse name: N/A  . Number of children: N/A  . Years of education: N/A   Occupational History  .  Self-Employed    Does not work outside the home   Social History Main Topics  . Smoking status: Former Research scientist (life sciences)  . Smokeless tobacco: Former Systems developer    Quit date: 07/28/1992  . Alcohol use No  . Drug use: No  . Sexual activity: Not on file   Other Topics Concern  . 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     Current Outpatient Prescriptions on File Prior to Visit  Medication Sig Dispense Refill  . aspirin 81 MG tablet Take 81 mg by mouth daily.      Marland Kitchen atorvastatin (LIPITOR) 40 MG tablet Take 40 mg by mouth daily. Take 1/2 20 mg daily    . cetirizine (ZYRTEC) 10 MG tablet Take 10 mg by mouth daily.    Marland Kitchen glucose blood (ONE TOUCH ULTRA TEST) test strip Check blood sugar 5 to 6 times a day or as directed     . pioglitazone (ACTOS) 45 MG tablet Take 1 tablet (45 mg  total) by mouth daily. 90 tablet 1  . atorvastatin (LIPITOR) 40 MG tablet TAKE ONE TABLET BY MOUTH ONCE DAILY (Patient not taking: Reported on 12/08/2016) 90 tablet 3   No current facility-administered medications on file prior to visit.     No Known Allergies  Family History  Problem Relation Age of Onset  . Graves' disease Mother   . Heart attack Maternal Grandfather   . Heart attack Paternal Grandfather   . Diabetes Brother   . HIV Brother   . Heart disease Brother 27       CABG  . Heart attack Father 26       COD  . Heart failure Father   . Kidney disease Father     BP 124/86   Pulse 66   Ht 5\' 10"  (1.778 m)   Wt 275 lb (124.7 kg)   SpO2 96%   BMI 39.46 kg/m    Review of Systems He denies hypoglycemia.  He has developed a toe blister    Objective:   Physical Exam VITAL SIGNS:  See vs page GENERAL: no distress Pulses: dorsalis pedis intact bilat.   MSK: no  deformity of the feet CV: no leg edema Skin:  1 cm shallow ulcer at the plantar aspect of the right great toe.  No drainage/erythema.  normal color and temp on the feet. Neuro: sensation is intact to touch on the feet   Lab Results  Component Value Date   HGBA1C 6.9 12/08/2016      Assessment & Plan:  Type 2 DM: he needs increased rx, if it can be done with a regimen that avoids or minimizes hypoglycemia. Toe ulcer: new Patient Instructions  check your blood sugar once a day.  vary the time of day when you check, between before the 3 meals, and at bedtime.  also check if you have symptoms of your blood sugar being too high or too low.  please keep a record of the readings and bring it to your next appointment here.  You can write it on any piece of paper.  please call us sooner if your blood sugar goes below 70, or if you have a lot of readings over 200.  I have sent a prescription to your pharmacy, to add "bromocriptine."  Keep the blister on you toe covered with antibiotic ointment and a bandaid.  Call  if it gets worse Please come back for a follow-up appointment in 4 months.

## 2016-12-08 NOTE — Patient Instructions (Addendum)
check your blood sugar once a day.  vary the time of day when you check, between before the 3 meals, and at bedtime.  also check if you have symptoms of your blood sugar being too high or too low.  please keep a record of the readings and bring it to your next appointment here.  You can write it on any piece of paper.  please call us sooner if your blood sugar goes below 70, or if you have a lot of readings over 200.  I have sent a prescription to your pharmacy, to add "bromocriptine."  Keep the blister on you toe covered with antibiotic ointment and a bandaid.  Call if it gets worse Please come back for a follow-up appointment in 4 months.

## 2017-02-17 ENCOUNTER — Ambulatory Visit (INDEPENDENT_AMBULATORY_CARE_PROVIDER_SITE_OTHER): Payer: 59 | Admitting: Internal Medicine

## 2017-02-17 ENCOUNTER — Encounter: Payer: Self-pay | Admitting: Internal Medicine

## 2017-02-17 VITALS — BP 112/82 | HR 81 | Ht 70.0 in | Wt 271.0 lb

## 2017-02-17 DIAGNOSIS — Z79899 Other long term (current) drug therapy: Secondary | ICD-10-CM

## 2017-02-17 DIAGNOSIS — E119 Type 2 diabetes mellitus without complications: Secondary | ICD-10-CM

## 2017-02-17 DIAGNOSIS — I10 Essential (primary) hypertension: Secondary | ICD-10-CM | POA: Diagnosis not present

## 2017-02-17 DIAGNOSIS — E782 Mixed hyperlipidemia: Secondary | ICD-10-CM

## 2017-02-17 NOTE — Patient Instructions (Signed)
Medication Instructions:  Your physician recommends that you continue on your current medications as directed. Please refer to the Current Medication list given to you today.  Labwork: FASTING LIPID SOON   Testing/Procedures: NONE  Follow-Up: Your physician wants you to follow-up in: [place] will receive a reminder letter in the mail two months in advance. If you don't receive a letter, please call our office to schedule the follow-up appointment.  If you need a refill on your cardiac medications before your next appointment, please call your pharmacy.

## 2017-02-17 NOTE — Progress Notes (Signed)
Grossly normal   OFFICE NOTE  Chief Complaint:  No complaints  Primary Care Physician: Burnis Medin, MD  HPI:  Alfred Johnson is a pleasant 46 year old male who is coming referred to me by Dr. Regis Bill for evaluation of coronary disease. His past history significant for diabetes followed by Dr. Loanne Drilling in endocrinology. He does not have hypertension and is not on treatment for any dyslipidemia. His last lipid profile showed an LDL cholesterol 95 with total cholesterol less than 160. He does have a strong family history of heart disease with heart attacks in his father and both grandfathers. He also recently had a brother who had 3 vessel, bypass one month ago. Since that time he's been having some intermittent symptoms with vague, atypical sounding chest pain and occasional lightheadedness. He did have a pneumonia last summer. Fortunately, he is very active. He's been doing significant exercise several days a week including both aerobic and resistance training. He's managed to have no symptoms such as chest pain or worsening shortness of breath during these exercises.  Alfred Johnson returns today for follow-up. He underwent a CT coronary artery calcium score. This resulted back is 0. Unfortunately he was noted to have some small pulmonary nodules. He will need a repeat plain chest CT in one year. He also obtained a cholesterol profile which demonstrated an LDL-P of 1566, LDL-C of 117, HDL 45, triglycerides 64 and total cholesterol 175. Although he has no chronic calcium, the diabetic he should have risk factor modification. We talked about cholesterol medications and he did research with his insurance company as to what medications would be the most cost effective. He is interested in taking Crestor which I think is a good medication.  02/11/2016  Alfred Johnson was seen today in follow-up. Over the past year he's done fairly well. His blood sugars however have been elevated and recently he was  started on Invokana in addition to his other diabetes medicines. Blood pressure appears well controlled today. He denies any chest pain or worsening shortness of breath. Of note his PCP recheck his cholesterol November 2016 which showed a total cholesterol of 86, triglycerides 47, HDL 40 and LDL of 36. This represents significant cholesterol control.  02/17/2017  Alfred Johnson returns today for follow-up. Spent a year since I last saw him. As a recap he had an initial coronary artery calcium score which was 0 however subsequently he had a follow-up of a small pulmonary nodule which was found to be stable, but the radiologist indicated there was a small speck of calcium in the LAD. Either this is relevant however since he is diagnosed as diabetic. He is at high risk and therefore will need a strict cholesterol control with a goal LDL-C less than 70. He was initially placed on atorvastatin 40 mg daily however did have side effects including myalgias on that dose. I asked him to reduce the dose to 20 mg daily a says is tolerating this just fine. He reports taking the medicine regularly. Unfortunately has had some weight gain and says that his diet is "horrible". He says that he goes in phases as far as exercise and diet. I told him that we would really want to be aggressive, particularly looking at his lifetime risk, which he found to be funny. He said that no males in his family have lived to be greater than 81 years old. Despite that, he does want to live a longer, healthier life.  PMHx:  Past Medical History:  Diagnosis Date  . ANEMIA DUE TO CHRONIC BLOOD LOSS 09/10/2009  . Diabetes mellitus   . Elevated BP   . GERD (gastroesophageal reflux disease)   . HYPERLIPIDEMIA, MIXED 03/23/2007  . Hypertension   . KIDNEY STONE 02/05/2009  . VENTRICULAR FUNCTION, DECREASED 06/21/2007    No past surgical history on file.  FAMHx:  Family History  Problem Relation Age of Onset  . Graves' disease Mother   .  Heart attack Father 38       COD  . Heart failure Father   . Kidney disease Father   . Diabetes Brother   . HIV Brother   . Heart attack Maternal Grandfather   . Heart attack Paternal Grandfather   . Heart disease Brother 64       CABG    SOCHx:   reports that he has quit smoking. He quit smokeless tobacco use about 24 years ago. He reports that he does not drink alcohol or use drugs.  ALLERGIES:  No Known Allergies  ROS: Pertinent items noted in HPI and remainder of comprehensive ROS otherwise negative.  HOME MEDS: Current Outpatient Prescriptions  Medication Sig Dispense Refill  . aspirin 81 MG tablet Take 81 mg by mouth daily.      Marland Kitchen atorvastatin (LIPITOR) 40 MG tablet Take 40 mg by mouth daily. Take 1/2 20 mg daily    . bromocriptine (PARLODEL) 2.5 MG tablet 1/4 tab daily 8 tablet 11  . cetirizine (ZYRTEC) 10 MG tablet Take 10 mg by mouth daily.    Marland Kitchen glucose blood (ONE TOUCH ULTRA TEST) test strip Check blood sugar 5 to 6 times a day or as directed     . JARDIANCE 25 MG TABS tablet     . pioglitazone (ACTOS) 45 MG tablet Take 1 tablet (45 mg total) by mouth daily. 90 tablet 1  . SitaGLIPtin-MetFORMIN HCl 50-1000 MG TB24 Take 2 tablets by mouth daily. 60 tablet 11   No current facility-administered medications for this visit.     LABS/IMAGING: No results found for this or any previous visit (from the past 48 hour(s)). No results found.  WEIGHTS: Wt Readings from Last 3 Encounters:  02/17/17 271 lb (122.9 kg)  12/08/16 275 lb (124.7 kg)  06/10/16 273 lb (123.8 kg)    VITALS: BP 112/82   Pulse 81   Ht 5\' 10"  (1.778 m)   Wt 271 lb (122.9 kg)   BMI 38.88 kg/m   EXAM: General appearance: alert, no distress and moderately obese Neck: no carotid bruit, no JVD and thyroid not enlarged, symmetric, no tenderness/mass/nodules Lungs: clear to auscultation bilaterally Heart: regular rate and rhythm, S1, S2 normal, no murmur, click, rub or gallop Abdomen: soft,  non-tender; bowel sounds normal; no masses,  no organomegaly Extremities: extremities normal, atraumatic, no cyanosis or edema Pulses: 2+ and symmetric Skin: Skin color, texture, turgor normal. No rashes or lesions Neurologic: GroPsych: Pleasanty normal Psych: Pleasant  EKG: Normal sinus rhythm 81, minimal voltage criteria for LVH, inferior Q waves - personally reviewed  ASSESSMENT: 1. Atypical chest pain - Zero CAC 2. Strong family history of coronary disease 3. Type 2 diabetes 4. Obesity 5. Dyslipidemia 6. Solitary pulmonary nodule - benign  PLAN: 1.   Alfred Johnson has coronary risk in the setting of strong family history and type 2 diabetes as well as obesity dyslipidemia. He was initially thought to have no coronary artery calcium however a subsequent CT follow-up for his pulmonary nodule indicated there was some LAD  calcium. This is really not relevant since his target LDL is low at less than 70. Unfortunately had to reduce the dose of his atorvastatin due to side effects. I would like to reassess his lipid profile today. If he's not at goal we can work on diet and exercise if he's fairly close to goal over the next 6 months. Plan to recheck it then and if again not at goal would recommend adding ezetimibe 10 mg daily to his regimen.  Follow-up with me annually or sooner as necessary.  Pixie Casino, MD, [facility] Attending Cardiologist [place] 02/17/2017, 12:47 PM

## 2017-02-17 NOTE — Addendum Note (Signed)
Addended by: Alvina Filbert B on: 02/17/2017 02:31 PM   Modules accepted: Orders

## 2017-02-19 LAB — LIPID PANEL
Chol/HDL Ratio: 2.6 ratio (ref 0.0–5.0)
Cholesterol, Total: 102 mg/dL (ref 100–199)
HDL: 40 mg/dL (ref >39)
LDL Calculated: 50 mg/dL (ref 0–99)
Triglycerides: 59 mg/dL (ref 0–149)
VLDL Cholesterol Cal: 12 mg/dL (ref 5–40)

## 2017-04-14 ENCOUNTER — Encounter: Payer: Self-pay | Admitting: Endocrinology

## 2017-04-14 ENCOUNTER — Ambulatory Visit (INDEPENDENT_AMBULATORY_CARE_PROVIDER_SITE_OTHER): Payer: 59 | Admitting: Endocrinology

## 2017-04-14 ENCOUNTER — Other Ambulatory Visit: Payer: Self-pay | Admitting: Endocrinology

## 2017-04-14 VITALS — BP 132/82 | HR 80 | Wt 266.0 lb

## 2017-04-14 DIAGNOSIS — E119 Type 2 diabetes mellitus without complications: Secondary | ICD-10-CM

## 2017-04-14 LAB — MICROALBUMIN / CREATININE URINE RATIO
CREATININE, U: 76.9 mg/dL
MICROALB UR: 0.7 mg/dL (ref 0.0–1.9)
MICROALB/CREAT RATIO: 0.9 mg/g (ref 0.0–30.0)

## 2017-04-14 LAB — POCT GLYCOSYLATED HEMOGLOBIN (HGB A1C): Hemoglobin A1C: 6.7

## 2017-04-14 NOTE — Patient Instructions (Addendum)
check your blood sugar once a day.  vary the time of day when you check, between before the 3 meals, and at bedtime.  also check if you have symptoms of your blood sugar being too high or too low.  please keep a record of the readings and bring it to your next appointment here.  You can write it on any piece of paper.  please call us sooner if your blood sugar goes below 70, or if you have a lot of readings over 200.   Please come back for a follow-up appointment in 6 months.

## 2017-04-14 NOTE — Progress Notes (Signed)
Subjective:    Patient ID: Alfred Johnson, male    DOB: 05/04/1971, 46 y.o.   MRN: [id number]  HPI Pt returns for f/u of diabetes mellitus: DM type: 2 Dx'ed: 3500 Complications: none Therapy: 5 oral meds DKA: never Severe hypoglycemia: never Pancreatitis: never Other: he has never been on insulin; he declines weight loss surgery; he did not tolerate invokana (itching).  Interval history: He says cbg's are well-controlled.  pt states he feels well in general.   Past Medical History:  Diagnosis Date  . ANEMIA DUE TO CHRONIC BLOOD LOSS 09/10/2009  . Diabetes mellitus   . Elevated BP   . GERD (gastroesophageal reflux disease)   . HYPERLIPIDEMIA, MIXED 03/23/2007  . Hypertension   . KIDNEY STONE 02/05/2009  . VENTRICULAR FUNCTION, DECREASED 06/21/2007    No past surgical history on file.  Social History   Social History  . Marital status: Married    Spouse name: N/A  . Number of children: N/A  . Years of education: N/A   Occupational History  .  Self-Employed    Does not work outside the home   Social History Main Topics  . Smoking status: Former Research scientist (life sciences)  . Smokeless tobacco: Former Systems developer    Quit date: 07/28/1992  . Alcohol use No  . Drug use: No  . Sexual activity: Not on file   Other Topics Concern  . 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     Current Outpatient Prescriptions on File Prior to Visit  Medication Sig Dispense Refill  . aspirin 81 MG tablet Take 81 mg by mouth daily.      Marland Kitchen atorvastatin (LIPITOR) 40 MG tablet Take 40 mg by mouth daily. Take 1/2 20 mg daily    . bromocriptine (PARLODEL) 2.5 MG tablet 1/4 tab daily 8 tablet 11  . cetirizine (ZYRTEC) 10 MG tablet Take 10 mg by mouth daily.    Marland Kitchen glucose blood (ONE TOUCH ULTRA TEST) test strip Check blood sugar 5 to 6 times a day or as  directed     . JARDIANCE 25 MG TABS tablet     . SitaGLIPtin-MetFORMIN HCl 50-1000 MG TB24 Take 2 tablets by mouth daily. 60 tablet 11   No current facility-administered medications on file prior to visit.     No Known Allergies  Family History  Problem Relation Age of Onset  . Graves' disease Mother   . Heart attack Father 31       COD  . Heart failure Father   . Kidney disease Father   . Diabetes Brother   . HIV Brother   . Heart attack Maternal Grandfather   . Heart attack Paternal Grandfather   . Heart disease Brother 95       CABG    BP 132/82 (BP Location: Left Arm, Patient Position: Sitting)   Pulse 80   Wt 266 lb (120.7 kg)   SpO2 95%   BMI 38.17 kg/m    Review of Systems He denies hypoglycemia    Objective:   Physical Exam VITAL SIGNS:  See vs page GENERAL: no distress Pulses: foot pulses are intact bilaterally.   MSK: no deformity of the feet or ankles.  CV: no edema of  the legs or ankles Skin:  no ulcer on the feet or ankles.  normal color and temp on the feet and ankles Neuro: sensation is intact to touch on the feet and ankles.    A1c=6.7%  Lab Results  Component Value Date   CREATININE 0.96 11/13/2015   BUN 16 11/13/2015   NA 139 11/13/2015   K 4.7 11/13/2015   CL 104 11/13/2015   CO2 28 11/13/2015      Assessment & Plan:  Type 2 DM: well-controlled Itching: this limits rx options.   eelv BP, better.  No rx is needed now.   Patient Instructions  check your blood sugar once a day.  vary the time of day when you check, between before the 3 meals, and at bedtime.  also check if you have symptoms of your blood sugar being too high or too low.  please keep a record of the readings and bring it to your next appointment here.  You can write it on any piece of paper.  please call us sooner if your blood sugar goes below 70, or if you have a lot of readings over 200.   Please come back for a follow-up appointment in 6 months.

## 2017-04-17 ENCOUNTER — Encounter: Payer: Self-pay | Admitting: Internal Medicine

## 2017-05-01 LAB — HM DIABETES EYE EXAM

## 2017-05-11 ENCOUNTER — Encounter: Payer: Self-pay | Admitting: Internal Medicine

## 2017-05-20 ENCOUNTER — Telehealth: Payer: Self-pay | Admitting: Internal Medicine

## 2017-05-20 NOTE — Telephone Encounter (Signed)
Pt is due for CPE, has not had one since 2016.  Is there a day that this patient can be scheduled in before the end of the year?  Please advise Dr Regis Bill, thanks.

## 2017-05-20 NOTE — Telephone Encounter (Signed)
yes

## 2017-05-20 NOTE — Telephone Encounter (Signed)
Pt would like cpx before end of year. Can I create 30 min slot? °

## 2017-05-21 NOTE — Telephone Encounter (Signed)
Please schedule 94min slot

## 2017-05-21 NOTE — Telephone Encounter (Signed)
Pt has been schedule.

## 2017-06-01 NOTE — Progress Notes (Signed)
Chief Complaint  Patient presents with  . Annual Exam    no new concerns    HPI: Patient  Alfred Johnson  46 y.o. comes in today for Preventive Health Care visit  Dm care per dr Loanne Drilling and  In control Sess cards and on  Satin med nose   Had knee surgery this years right contributed to weight issues  Not depressed   Health Maintenance  Topic Date Due  . HIV Screening  03/30/1984  . PNEUMOCOCCAL POLYSACCHARIDE VACCINE (2) 06/01/2012  . INFLUENZA VACCINE  02/25/2017  . HEMOGLOBIN A1C  10/12/2017  . FOOT EXAM  04/14/2018  . URINE MICROALBUMIN  04/14/2018  . OPHTHALMOLOGY EXAM  05/01/2018  . TETANUS/TDAP  06/07/2025   Health Maintenance Review LIFESTYLE:  Exercise:   Just  resumed exercising a gain  From knee surgery .  [place]  Tobacco/ETS:  no Alcohol:   Barely  Sugar beverages: Sleep:  6-7 hours   Drug use: no HH of  3... 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 20 - 60 hours.     ROS:  GEN/ HEENT: No fever, significant weight changes sweats headaches vision problems hearing changes, CV/ PULM; No chest pain shortness of breath cough, syncope,edema  change in exercise tolerance. GI /GU: No adominal pain, vomiting, change in bowel habits. No blood in the stool. No significant GU symptoms. SKIN/HEME: ,no acute skin rashes suspicious lesions or bleeding. No lymphadenopathy, nodules, masses.  NEURO/ PSYCH:  No neurologic signs such as weakness numbness. No depression anxiety. IMM/ Allergy: No unusual infections.  Allergy .   REST of 12 system review negative except as per HPI   Past Medical History:  Diagnosis Date  . ANEMIA DUE TO CHRONIC BLOOD LOSS 09/10/2009  . Diabetes mellitus   . Elevated BP   . GERD (gastroesophageal reflux disease)   . HYPERLIPIDEMIA, MIXED 03/23/2007  . Hypertension   . KIDNEY STONE 02/05/2009  . VENTRICULAR FUNCTION, DECREASED 06/21/2007    No past surgical history on file.  Family History    Problem Relation Age of Onset  . Graves' disease Mother   . Heart attack Father 82       COD  . Heart failure Father   . Kidney disease Father   . Diabetes Brother   . HIV Brother   . Heart attack Maternal Grandfather   . Heart attack Paternal Grandfather   . Heart disease Brother 46       CABG    Social History   Socioeconomic History  . Marital status: Married    Spouse name: None  . Number of children: None  . Years of education: None  . Highest education level: None  Social Needs  . Financial resource strain: None  . Food insecurity - worry: None  . Food insecurity - inability: None  . Transportation needs - medical: None  . Transportation needs - non-medical: None  Occupational History    Employer: SELF-EMPLOYED    Comment: Does not work outside the home  Tobacco Use  . Smoking status: Former Research scientist (life sciences)  . Smokeless tobacco: Former Systems developer    Quit date: 07/28/1992  Substance and Sexual Activity  . Alcohol use: No  . Drug use: No  . Sexual activity: None  Other Topics Concern  . 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     Outpatient Medications Prior to Visit  Medication Sig Dispense Refill  . aspirin 81 MG tablet Take 81 mg by mouth daily.      Marland Kitchen atorvastatin (LIPITOR) 40 MG tablet Take 40 mg by mouth daily. Take 1/2 20 mg daily    . bromocriptine (PARLODEL) 2.5 MG tablet 1/4 tab daily 8 tablet 11  . cetirizine (ZYRTEC) 10 MG tablet Take 10 mg by mouth daily.    Marland Kitchen glucose blood (ONE TOUCH ULTRA TEST) test strip Check blood sugar 5 to 6 times a day or as directed     . JARDIANCE 25 MG TABS tablet     . pioglitazone (ACTOS) 45 MG tablet TAKE 1 TABLET BY MOUTH ONCE DAILY 90 tablet 1  . SitaGLIPtin-MetFORMIN HCl 50-1000 MG TB24 Take 2 tablets by mouth daily. 60 tablet 11   No facility-administered medications  prior to visit.      EXAM:  BP 124/88 (BP Location: Right Arm, Patient Position: Sitting, Cuff Size: Normal)   Pulse 96   Temp 98 F (36.7 C) (Oral)   Ht 5' 8.25" (1.734 m)   Wt 273 lb 4.8 oz (124 kg)   BMI 41.25 kg/m   Body mass index is 41.25 kg/m. Wt Readings from Last 3 Encounters:  06/02/17 273 lb 4.8 oz (124 kg)  04/14/17 266 lb (120.7 kg)  02/17/17 271 lb (122.9 kg)    Physical Exam: Vital signs reviewed TZG:YFVC is a well-developed well-nourished alert cooperative    who appearsr stated age in no acute distress.  HEENT: normocephalic atraumatic , Eyes: PERRL EOM's full, conjunctiva clear, Nares: paten,t no deformity discharge or tenderness., Ears: no deformity EAC's clear TMs with normal landmarks. Mouth: clear OP, no lesions, edema.  Moist mucous membranes. Dentition in adequate repair. NECK: supple without masses, thyromegaly or bruits. CHEST/PULM:  Clear to auscultation and percussion breath sounds equal no wheeze , rales or rhonchi. No chest wall deformities or tenderness. Breast: normal by inspection .CV: PMI is nondisplaced, S1 S2 no gallops, murmurs, rubs. Peripheral pulses are full without delay.No JVD .  ABDOMEN: Bowel sounds normal nontender  No guard or rebound, no hepato splenomegal no CVA tenderness.  No hernia. Extremtities:  No clubbing cyanosis or edema, no acute joint swelling or redness no focal atrophy r knee not examined  NEURO:  Oriented x3, cranial nerves 3-12 appear to be intact, no obvious focal weakness,gait within normal limits no abnormal reflexes or asymmetrical SKIN: No acute rashes normal turgor, color, no bruising or petechiae. PSYCH: Oriented, good eye contact, no obvious depression anxiety, cognition and judgment appear normal. LN: no cervical axillary inguinal adenopathy  Lab Results  Component Value Date   WBC 6.4 05/30/2015   HGB 14.2 05/30/2015   HCT 43.2 05/30/2015   PLT 255.0 05/30/2015   GLUCOSE 143 (H) 11/13/2015   CHOL 102  02/19/2017   TRIG 59 02/19/2017   HDL 40 02/19/2017   LDLDIRECT 114.5 02/26/2007   LDLCALC 50 02/19/2017   ALT 23 05/30/2015   AST 20 05/30/2015   NA 139 11/13/2015   K 4.7 11/13/2015   CL 104 11/13/2015   CREATININE 0.96 11/13/2015   BUN 16 11/13/2015   CO2 28 11/13/2015   TSH 1.47 05/30/2015   PSA 0.37 06/08/2015   HGBA1C 6.7 04/14/2017   MICROALBUR 0.7 04/14/2017  BP Readings from Last 3 Encounters:  06/02/17 124/88  04/14/17 132/82  02/17/17 112/82    Wt Readings from Last 3 Encounters:  06/02/17 273 lb 4.8 oz (124 kg)  04/14/17 266 lb (120.7 kg)  02/17/17 271 lb (122.9 kg)     ASSESSMENT AND PLAN:  Discussed the following assessment and plan:  Visit for preventive health examination - Plan: Basic metabolic panel, Hepatic function panel  Need for influenza vaccination - Plan: Flu Vaccine QUAD 6+ mos PF IM (Fluarix Quad PF)  HYPERLIPIDEMIA, MIXED - Plan: Basic metabolic panel, Hepatic function panel  Type 2 diabetes mellitus without complication, without long-term current use of insulin (HCC) - Plan: Basic metabolic panel, Hepatic function panel  BMI 40.0-44.9, adult (HCC)   Anticipation colon screen at 24  Advised healthy weight loss  Pt is aware .  Reviewed   Lab monitoring etc  Patient Care Team: Panosh, Standley Brooking, MD as PCP - General Renato Shin, MD as Attending Physician (Internal Medicine) Calvert Cantor, MD (Ophthalmology) Hayden Pedro, MD as Consulting Physician (Ophthalmology) Debara Pickett Nadean Corwin, MD as Consulting Physician (Cardiology) Patient Instructions  Continue lifestyle intervention healthy eating and exercise .  Consider getting   psa at age 59 years.      Preventive Care 40-64 Years, Male Preventive care refers to lifestyle choices and visits with your health care provider that can promote health and wellness. What does preventive care include?  A yearly physical exam. This is also called an annual well check.  Dental exams  once or twice a year.  Routine eye exams. Ask your health care provider how often you should have your eyes checked.  Personal lifestyle choices, including: ? Daily care of your teeth and gums. ? Regular physical activity. ? Eating a healthy diet. ? Avoiding tobacco and drug use. ? Limiting alcohol use. ? Practicing safe sex. ? Taking low-dose aspirin every day starting at age 37. What happens during an annual well check? The services and screenings done by your health care provider during your annual well check will depend on your age, overall health, lifestyle risk factors, and family history of disease. Counseling Your health care provider may ask you questions about your:  Alcohol use.  Tobacco use.  Drug use.  Emotional well-being.  Home and relationship well-being.  Sexual activity.  Eating habits.  Work and work Statistician.  Screening You may have the following tests or measurements:  Height, weight, and BMI.  Blood pressure.  Lipid and cholesterol levels. These may be checked every 5 years, or more frequently if you are over 4 years old.  Skin check.  Lung cancer screening. You may have this screening every year starting at age 75 if you have a 30-pack-year history of smoking and currently smoke or have quit within the past 15 years.  Fecal occult blood test (FOBT) of the stool. You may have this test every year starting at age 79.  Flexible sigmoidoscopy or colonoscopy. You may have a sigmoidoscopy every 5 years or a colonoscopy every 10 years starting at age 46.  Prostate cancer screening. Recommendations will vary depending on your family history and other risks.  Hepatitis C blood test.  Hepatitis B blood test.  Sexually transmitted disease (STD) testing.  Diabetes screening. This is done by checking your blood sugar (glucose) after you have not eaten for a while (fasting). You may have this done every 1-3 years.  Discuss your test results,  treatment options, and if necessary, the need  for more tests with your health care provider. Vaccines Your health care provider may recommend certain vaccines, such as:  Influenza vaccine. This is recommended every year.  Tetanus, diphtheria, and acellular pertussis (Tdap, Td) vaccine. You may need a Td booster every 10 years.  Varicella vaccine. You may need this if you have not been vaccinated.  Zoster vaccine. You may need this after age 60.  Measles, mumps, and rubella (MMR) vaccine. You may need at least one dose of MMR if you were born in 1957 or later. You may also need a second dose.  Pneumococcal 13-valent conjugate (PCV13) vaccine. You may need this if you have certain conditions and have not been vaccinated.  Pneumococcal polysaccharide (PPSV23) vaccine. You may need one or two doses if you smoke cigarettes or if you have certain conditions.  Meningococcal vaccine. You may need this if you have certain conditions.  Hepatitis A vaccine. You may need this if you have certain conditions or if you travel or work in places where you may be exposed to hepatitis A.  Hepatitis B vaccine. You may need this if you have certain conditions or if you travel or work in places where you may be exposed to hepatitis B.  Haemophilus influenzae type b (Hib) vaccine. You may need this if you have certain risk factors.  Talk to your health care provider about which screenings and vaccines you need and how often you need them. This information is not intended to replace advice given to you by your health care provider. Make sure you discuss any questions you have with your health care provider. Document Released: 08/10/2015 Document Revised: 04/02/2016 Document Reviewed: 05/15/2015 Elsevier Interactive Patient Education  2017 [place] K. Panosh M.D.

## 2017-06-02 ENCOUNTER — Ambulatory Visit (INDEPENDENT_AMBULATORY_CARE_PROVIDER_SITE_OTHER): Payer: 59 | Admitting: Internal Medicine

## 2017-06-02 ENCOUNTER — Encounter: Payer: Self-pay | Admitting: Internal Medicine

## 2017-06-02 VITALS — BP 124/88 | HR 96 | Temp 98.0°F | Ht 68.25 in | Wt 273.3 lb

## 2017-06-02 DIAGNOSIS — Z23 Encounter for immunization: Secondary | ICD-10-CM | POA: Diagnosis not present

## 2017-06-02 DIAGNOSIS — Z0000 Encounter for general adult medical examination without abnormal findings: Secondary | ICD-10-CM | POA: Diagnosis not present

## 2017-06-02 DIAGNOSIS — Z6841 Body Mass Index (BMI) 40.0 and over, adult: Secondary | ICD-10-CM

## 2017-06-02 DIAGNOSIS — E782 Mixed hyperlipidemia: Secondary | ICD-10-CM | POA: Diagnosis not present

## 2017-06-02 DIAGNOSIS — E119 Type 2 diabetes mellitus without complications: Secondary | ICD-10-CM

## 2017-06-02 LAB — BASIC METABOLIC PANEL
BUN: 21 mg/dL (ref 6–23)
CHLORIDE: 104 meq/L (ref 96–112)
CO2: 25 meq/L (ref 19–32)
Calcium: 10.2 mg/dL (ref 8.4–10.5)
Creatinine, Ser: 0.8 mg/dL (ref 0.40–1.50)
GFR: 109.58 mL/min (ref >60.00)
GLUCOSE: 129 mg/dL — AB (ref 70–99)
POTASSIUM: 4.6 meq/L (ref 3.5–5.1)
Sodium: 139 mEq/L (ref 135–145)

## 2017-06-02 LAB — HEPATIC FUNCTION PANEL
ALT: 18 U/L (ref 0–53)
AST: 11 U/L (ref 0–37)
Albumin: 4.7 g/dL (ref 3.5–5.2)
Alkaline Phosphatase: 71 U/L (ref 39–117)
BILIRUBIN TOTAL: 0.5 mg/dL (ref 0.2–1.2)
Bilirubin, Direct: 0.1 mg/dL (ref 0.0–0.3)
Total Protein: 7.4 g/dL (ref 6.0–8.3)

## 2017-06-02 NOTE — Patient Instructions (Signed)
Continue lifestyle intervention healthy eating and exercise .  Consider getting   psa at age 48 years.      Preventive Care 40-64 Years, Male Preventive care refers to lifestyle choices and visits with your health care provider that can promote health and wellness. What does preventive care include?  A yearly physical exam. This is also called an annual well check.  Dental exams once or twice a year.  Routine eye exams. Ask your health care provider how often you should have your eyes checked.  Personal lifestyle choices, including: ? Daily care of your teeth and gums. ? Regular physical activity. ? Eating a healthy diet. ? Avoiding tobacco and drug use. ? Limiting alcohol use. ? Practicing safe sex. ? Taking low-dose aspirin every day starting at age 15. What happens during an annual well check? The services and screenings done by your health care provider during your annual well check will depend on your age, overall health, lifestyle risk factors, and family history of disease. Counseling Your health care provider may ask you questions about your:  Alcohol use.  Tobacco use.  Drug use.  Emotional well-being.  Home and relationship well-being.  Sexual activity.  Eating habits.  Work and work Statistician.  Screening You may have the following tests or measurements:  Height, weight, and BMI.  Blood pressure.  Lipid and cholesterol levels. These may be checked every 5 years, or more frequently if you are over 87 years old.  Skin check.  Lung cancer screening. You may have this screening every year starting at age 6 if you have a 30-pack-year history of smoking and currently smoke or have quit within the past 15 years.  Fecal occult blood test (FOBT) of the stool. You may have this test every year starting at age 63.  Flexible sigmoidoscopy or colonoscopy. You may have a sigmoidoscopy every 5 years or a colonoscopy every 10 years starting at age  65.  Prostate cancer screening. Recommendations will vary depending on your family history and other risks.  Hepatitis C blood test.  Hepatitis B blood test.  Sexually transmitted disease (STD) testing.  Diabetes screening. This is done by checking your blood sugar (glucose) after you have not eaten for a while (fasting). You may have this done every 1-3 years.  Discuss your test results, treatment options, and if necessary, the need for more tests with your health care provider. Vaccines Your health care provider may recommend certain vaccines, such as:  Influenza vaccine. This is recommended every year.  Tetanus, diphtheria, and acellular pertussis (Tdap, Td) vaccine. You may need a Td booster every 10 years.  Varicella vaccine. You may need this if you have not been vaccinated.  Zoster vaccine. You may need this after age 79.  Measles, mumps, and rubella (MMR) vaccine. You may need at least one dose of MMR if you were born in 1957 or later. You may also need a second dose.  Pneumococcal 13-valent conjugate (PCV13) vaccine. You may need this if you have certain conditions and have not been vaccinated.  Pneumococcal polysaccharide (PPSV23) vaccine. You may need one or two doses if you smoke cigarettes or if you have certain conditions.  Meningococcal vaccine. You may need this if you have certain conditions.  Hepatitis A vaccine. You may need this if you have certain conditions or if you travel or work in places where you may be exposed to hepatitis A.  Hepatitis B vaccine. You may need this if you have certain conditions  or if you travel or work in places where you may be exposed to hepatitis B.  Haemophilus influenzae type b (Hib) vaccine. You may need this if you have certain risk factors.  Talk to your health care provider about which screenings and vaccines you need and how often you need them. This information is not intended to replace advice given to you by your health  care provider. Make sure you discuss any questions you have with your health care provider. Document Released: 08/10/2015 Document Revised: 04/02/2016 Document Reviewed: 05/15/2015 Elsevier Interactive Patient Education  2017 [employer].

## 2017-06-03 ENCOUNTER — Other Ambulatory Visit: Payer: Self-pay | Admitting: Endocrinology

## 2017-10-07 ENCOUNTER — Other Ambulatory Visit: Payer: Self-pay | Admitting: Endocrinology

## 2017-10-12 ENCOUNTER — Ambulatory Visit (INDEPENDENT_AMBULATORY_CARE_PROVIDER_SITE_OTHER): Payer: 59 | Admitting: Endocrinology

## 2017-10-12 ENCOUNTER — Encounter: Payer: Self-pay | Admitting: Endocrinology

## 2017-10-12 VITALS — BP 138/86 | HR 86 | Ht 68.25 in | Wt 277.0 lb

## 2017-10-12 DIAGNOSIS — E119 Type 2 diabetes mellitus without complications: Secondary | ICD-10-CM | POA: Diagnosis not present

## 2017-10-12 LAB — HEMOGLOBIN A1C: HEMOGLOBIN A1C: 7.2 % — AB (ref 4.6–6.5)

## 2017-10-12 MED ORDER — PIOGLITAZONE HCL 45 MG PO TABS: 45.0000 mg | ORAL_TABLET | Freq: Every day | ORAL | 1 refills | Status: DC

## 2017-10-12 MED ORDER — BROMOCRIPTINE MESYLATE 2.5 MG PO TABS: 1.2500 mg | ORAL_TABLET | Freq: Every day | ORAL | 11 refills | Status: DC

## 2017-10-12 NOTE — Patient Instructions (Addendum)
check your blood sugar once a day.  vary the time of day when you check, between before the 3 meals, and at bedtime.  also check if you have symptoms of your blood sugar being too high or too low.  please keep a record of the readings and bring it to your next appointment here.  You can write it on any piece of paper.  please call us sooner if your blood sugar goes below 70, or if you have a lot of readings over 200.   blood tests are requested for you today.  We'll let you know about the results.  Please come back for a follow-up appointment in 6 months.

## 2017-10-12 NOTE — Progress Notes (Signed)
Subjective:    Patient ID: Alfred Johnson, male    DOB: [birth year], 47 y.o.   MRN: [id number]  HPI Pt returns for f/u of diabetes mellitus: DM type: 2 Dx'ed: 1517 Complications: none Therapy: 5 oral meds DKA: never Severe hypoglycemia: never Pancreatitis: never Other: he has never been on insulin; he declines weight loss surgery; he did not tolerate invokana (itching).  Interval history: He says cbg's are well-controlled.  pt states he feels well in general.   Past Medical History:  Diagnosis Date  . ANEMIA DUE TO CHRONIC BLOOD LOSS 09/10/2009  . Diabetes mellitus   . Elevated BP   . GERD (gastroesophageal reflux disease)   . HYPERLIPIDEMIA, MIXED 03/23/2007  . Hypertension   . KIDNEY STONE 02/05/2009  . VENTRICULAR FUNCTION, DECREASED 06/21/2007    No past surgical history on file.  Social History   Socioeconomic History  . Marital status: Married    Spouse name: Not on file  . Number of children: Not on file  . Years of education: Not on file  . Highest education level: Not on file  Social Needs  . Financial resource strain: Not on file  . Food insecurity - worry: Not on file  . Food insecurity - inability: Not on file  . Transportation needs - medical: Not on file  . Transportation needs - non-medical: Not on file  Occupational History    Employer: SELF-EMPLOYED    Comment: Does not work outside the home  Tobacco Use  . Smoking status: Former Research scientist (life sciences)  . Smokeless tobacco: Former Systems developer    Quit date: 07/28/1992  Substance and Sexual Activity  . Alcohol use: No  . Drug use: No  . Sexual activity: Not on file  Other Topics Concern  . 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     Current Outpatient Medications on File Prior to Visit  Medication Sig Dispense Refill  . aspirin 81 MG tablet Take 81 mg  by mouth daily.      Marland Kitchen atorvastatin (LIPITOR) 40 MG tablet Take 40 mg by mouth daily. Take 1/2 20 mg daily    . cetirizine (ZYRTEC) 10 MG tablet Take 10 mg by mouth daily.    Marland Kitchen glucose blood (ONE TOUCH ULTRA TEST) test strip Check blood sugar 5 to 6 times a day or as directed     . JARDIANCE 25 MG TABS tablet TAKE 1 TABLET BY MOUTH EVERY DAY 30 tablet 9  . SitaGLIPtin-MetFORMIN HCl 50-1000 MG TB24 Take 2 tablets by mouth daily. 60 tablet 11   No current facility-administered medications on file prior to visit.     No Known Allergies  Family History  Problem Relation Age of Onset  . Graves' disease Mother   . Heart attack Father 32       COD  . Heart failure Father   . Kidney disease Father   . Diabetes Brother   . HIV Brother   . Heart attack Maternal Grandfather   . Heart attack Paternal Grandfather   . Heart disease Brother 48       CABG    BP 138/86   Pulse 86   Ht 5' 8.25" (1.734 m)   Wt 277 lb (125.6 kg)  SpO2 92%   BMI 41.81 kg/m   Review of Systems He denies hypoglycemia    Objective:   Physical Exam VITAL SIGNS:  See vs page GENERAL: no distress Pulses: dorsalis pedis intact bilat.   MSK: no deformity of the feet CV: no leg edema Skin:  no ulcer on the feet.  normal color and temp on the feet. Neuro: sensation is intact to touch on the feet.    Lab Results  Component Value Date   HGBA1C 7.2 (H) 10/12/2017       Assessment & Plan:  Type 2 DM: he needs increased rx.  I have sent a prescription to your pharmacy, to increase bromocriptine.   Patient Instructions  check your blood sugar once a day.  vary the time of day when you check, between before the 3 meals, and at bedtime.  also check if you have symptoms of your blood sugar being too high or too low.  please keep a record of the readings and bring it to your next appointment here.  You can write it on any piece of paper.  please call us sooner if your blood sugar goes below 70, or if you have a lot  of readings over 200.   blood tests are requested for you today.  We'll let you know about the results.  Please come back for a follow-up appointment in 6 months.

## 2017-11-07 ENCOUNTER — Other Ambulatory Visit: Payer: Self-pay | Admitting: Endocrinology

## 2017-12-03 ENCOUNTER — Other Ambulatory Visit: Payer: Self-pay | Admitting: Endocrinology

## 2017-12-25 ENCOUNTER — Encounter (INDEPENDENT_AMBULATORY_CARE_PROVIDER_SITE_OTHER): Payer: Self-pay | Admitting: Orthopaedic Surgery

## 2017-12-25 ENCOUNTER — Ambulatory Visit (INDEPENDENT_AMBULATORY_CARE_PROVIDER_SITE_OTHER): Payer: 59

## 2017-12-25 ENCOUNTER — Ambulatory Visit (INDEPENDENT_AMBULATORY_CARE_PROVIDER_SITE_OTHER): Payer: 59 | Admitting: Orthopaedic Surgery

## 2017-12-25 DIAGNOSIS — M25561 Pain in right knee: Secondary | ICD-10-CM

## 2017-12-25 DIAGNOSIS — G8929 Other chronic pain: Secondary | ICD-10-CM

## 2017-12-25 DIAGNOSIS — S838X1A Sprain of other specified parts of right knee, initial encounter: Secondary | ICD-10-CM

## 2017-12-25 DIAGNOSIS — M25551 Pain in right hip: Secondary | ICD-10-CM

## 2017-12-25 NOTE — Addendum Note (Signed)
Addended by: Precious Bard on: 12/25/2017 11:28 AM   Modules accepted: Orders

## 2017-12-25 NOTE — Progress Notes (Addendum)
Office Visit Note   Patient: Alfred Johnson           Date of Birth: 05/06/1971           MRN: [id number] Visit Date: 12/25/2017              Requested by: Burnis Medin, MD [place], [place] [address] PCP: Burnis Medin, MD   Assessment & Plan: Visit Diagnoses:  1. Acute medial meniscal injury of knee, right, initial encounter   2. Pain in right hip     Plan: Impression is continued right knee pain suspect a re-tear of the medial meniscus.  Recommend MR arthrogram of the right knee to fully assess for this.  Follow-up to review the MRI.  Follow-Up Instructions: Return in about 10 days (around 01/04/2018).   Orders:  Orders Placed This Encounter  Procedures  . XR KNEE 3 VIEW RIGHT  . XR Pelvis 1-2 Views  . MR Knee Right w/ contrast  . Arthrogram   No orders of the defined types were placed in this encounter.     Procedures: No procedures performed   Clinical Data: No additional findings.   Subjective: Chief Complaint  Patient presents with  . Right Knee - Pain    Alfred Johnson is a very pleasant 47 year old gentleman who comes in with 8 to 34-month history of medial right knee pain.  He underwent knee arthroscopy with partial medial meniscectomy and chondroplasty by Dr. Theda Sers.  She he states that he had about 2 to 3 weeks of good pain relief but the pain has returned.  He has not had any cortisone injections.  He has been to numerous physical therapy sessions without any relief.  He states that he continues to have significant mechanical symptoms with sharp searing pain on the medial side of the knee that is worse with ambulation and standing.   Review of Systems  Constitutional: Negative.   All other systems reviewed and are negative.    Objective: Vital Signs: There were no vitals taken for this visit.  Physical Exam  Constitutional: He is oriented to person, place, and time. He appears well-developed and well-nourished.  HENT:    Head: Normocephalic and atraumatic.  Eyes: Pupils are equal, round, and reactive to light.  Neck: Neck supple.  Pulmonary/Chest: Effort normal.  Abdominal: Soft.  Musculoskeletal: Normal range of motion.  Neurological: He is alert and oriented to person, place, and time.  Skin: Skin is warm.  Psychiatric: He has a normal mood and affect. His behavior is normal. Judgment and thought content normal.  Nursing note and vitals reviewed.   Ortho Exam Right knee exam shows a small joint effusion.  He is very tender over the medial joint line.  Positive McMurray.  Collaterals and cruciates are stable. Specialty Comments:  No specialty comments available.  Imaging: No results found.   PMFS History: Patient Active Problem List   Diagnosis Date Noted  . Acute medial meniscus tear of right knee 01/05/2018  . Lung nodule 02/11/2016  . Diabetes mellitus without complication ([place]) 40/34/7425  . Left lower lobe pneumonia ([place]) 02/08/2014  . BMI 35.0-35.9,adult 06/01/2012  . Visit for preventive health examination 06/01/2012  . Cough 07/09/2010  . ANGER 09/10/2009  . WEIGHT GAIN 09/10/2009  . KIDNEY STONE 02/05/2009  . Diabetes ([place]) 03/23/2007  . HYPERLIPIDEMIA, MIXED 03/23/2007  . OBESITY NOS 02/25/2007  . Essential hypertension 02/25/2007  . GERD 02/11/2007   Past Medical History:  Diagnosis  Date  . ANEMIA DUE TO CHRONIC BLOOD LOSS 09/10/2009  . Diabetes mellitus   . Elevated BP   . GERD (gastroesophageal reflux disease)   . HYPERLIPIDEMIA, MIXED 03/23/2007  . Hypertension   . KIDNEY STONE 02/05/2009  . VENTRICULAR FUNCTION, DECREASED 06/21/2007    Family History  Problem Relation Age of Onset  . Graves' disease Mother   . Heart attack Father 16       COD  . Heart failure Father   . Kidney disease Father   . Diabetes Brother   . HIV Brother   . Heart attack Maternal Grandfather   . Heart attack Paternal Grandfather   . Heart disease Brother 50       CABG    History  reviewed. No pertinent surgical history. Social History   Occupational History    Employer: SELF-EMPLOYED    Comment: Does not work outside the home  Tobacco Use  . Smoking status: Former Research scientist (life sciences)  . Smokeless tobacco: Former Systems developer    Quit date: 07/28/1992  Substance and Sexual Activity  . Alcohol use: No  . Drug use: No  . Sexual activity: Not on file

## 2018-01-04 ENCOUNTER — Ambulatory Visit
Admission: RE | Admit: 2018-01-04 | Discharge: 2018-01-04 | Disposition: A | Discharge status: Home / Self Care | Payer: 59 | Source: Ambulatory Visit | Attending: Orthopaedic Surgery | Admitting: Orthopaedic Surgery

## 2018-01-04 DIAGNOSIS — M25561 Pain in right knee: Principal | ICD-10-CM

## 2018-01-04 DIAGNOSIS — G8929 Other chronic pain: Secondary | ICD-10-CM

## 2018-01-04 MED ORDER — IOPAMIDOL (ISOVUE-M 200) INJECTION 41%
38.0000 mL | Freq: Once | INTRAMUSCULAR | Status: AC
  Administered 2018-01-04: 38 mL via INTRA_ARTICULAR

## 2018-01-05 ENCOUNTER — Encounter (INDEPENDENT_AMBULATORY_CARE_PROVIDER_SITE_OTHER): Payer: Self-pay | Admitting: Orthopaedic Surgery

## 2018-01-05 ENCOUNTER — Ambulatory Visit (INDEPENDENT_AMBULATORY_CARE_PROVIDER_SITE_OTHER): Payer: 59 | Admitting: Orthopaedic Surgery

## 2018-01-05 DIAGNOSIS — S83241A Other tear of medial meniscus, current injury, right knee, initial encounter: Secondary | ICD-10-CM

## 2018-01-05 HISTORY — DX: Other tear of medial meniscus, current injury, right knee, initial encounter: S83.241A

## 2018-01-05 NOTE — Progress Notes (Addendum)
Office Visit Note   Patient: Alfred Johnson           Date of Birth: 10-Sep-1970           MRN: [id number] Visit Date: 01/05/2018              Requested by: Burnis Medin, MD [place], [place] [address] PCP: Burnis Medin, MD   Assessment & Plan: Visit Diagnoses:  1. Acute medial meniscus tear of right knee, initial encounter     Plan: MRI findings which were reviewed with the patient and the images themselves were also reviewed with the patient shows a complex tear of the medial meniscus with extrusion from the medial compartment and a para meniscal cyst.  I do feel that this tear is full-thickness and symptomatic.  I did tell him that the medial meniscus is dysfunctional.  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   My recommendation at this point is for arthroscopic partial medial meniscectomy and decompression of the cyst along with chondroplasty as indicated.  I also stressed the importance of weight loss to help preserve or at least slow down the progression of his chondromalacia.  I think a medial unloader brace would help with his symptoms especially during activity.  All questions answered to their satisfaction today.  They will give [place] a call when they are ready to schedule surgery.  We discussed the surgery in detail and the associated risks and benefits.  Handicap placard given today.    Patient has OA limiting his ADLs. He has not responded to ( injections/PT) or a knee sleeve  Total face to face encounter time was greater than 25 minutes and over half of this time was spent in counseling and/or coordination of care.  Follow-Up Instructions: Return if symptoms worsen or fail to improve.   Orders:  No orders of the defined types were placed in this encounter.  No orders of the defined types were placed in this encounter.     Procedures: No procedures  performed   Clinical Data: No additional findings.   Subjective: Chief Complaint  Patient presents with  . Right Knee - Follow-up    MRI review    [name] follows up today for MRI review.  He denies any changes in medical history.  They are leaving for [place] at the end of this week.   Review of Systems   Objective: Vital Signs: Ht 5\' 8"  (1.727 m)   Wt 277 lb (125.6 kg)   BMI 42.12 kg/m   Physical Exam  Ortho Exam Right knee exam is stable. Specialty Comments:  No specialty comments available.  Imaging: Mr Knee Right W/ Contrast  Result Date: 01/04/2018 CLINICAL DATA:  Medial right knee pain. History of previous arthroscopic surgery for meniscal tear. Chondroplasty. EXAM: MRI OF THE RIGHT KNEE WITH CONTRAST (MR Arthrogram) TECHNIQUE: Multiplanar, multisequence MR imaging of the knee was performed following intra-articular contrast injection under fluoroscopic guidance. No intravenous contrast was administered. COMPARISON:  Radiographs dated 12/25/2017 FINDINGS: MENISCI Medial meniscus: There is an irregular incomplete radial tear of the midbody, best seen on series 7 and image 19 of series 5. The tear extends to the periphery with a tiny parameniscal cyst seen on image 15 of series 10. However, there are a few intact superior fibers at the site of the tear. Lateral meniscus:  Normal. LIGAMENTS Cruciates:  Intact. Collaterals:  Intact. CARTILAGE Patellofemoral: Small focal 5 mm fissure in the center of the trochlear groove of the distal femur. Tiny area of superficial cartilage irregularity of the apex of the patella on image 7 of series 5. Medial: Slight thinning of the articular cartilage in the medial compartment. Lateral:  Normal. Joint:  Negative. Popliteal Fossa:  No Baker cyst. Intact popliteus tendon. Extensor Mechanism:  Intact quadriceps tendon and patellar tendon. Bones: No focal marrow signal abnormality. No fracture or dislocation. Other: None IMPRESSION: 1.  Irregular incomplete radial tear of the midbody of the medial meniscus. The tear extends to the periphery with a tiny adjacent parameniscal cyst. 2. Focal small area of full-thickness cartilage loss of the central aspect of the trochlear groove of the distal femur. 3. Diffuse slight narrowing of the articular cartilage in the medial compartment. Electronically Signed   By: Lorriane Shire M.D.   On: 01/04/2018 17:13   Arthrogram  Result Date: 01/04/2018 CLINICAL DATA:  RIGHT knee pain. FLUOROSCOPY TIME:  33 seconds corresponding to a Dose Area Product of 38.15 Gy*m2 PROCEDURE: RIGHT KNEE INJECTION UNDER FLUOROSCOPY Informed written consent was obtained.  Time-out was performed. An appropriate skin entrance site was determined. The site was marked, prepped with Betadine, draped in the usual sterile fashion, and infiltrated locally with 1% lidocaine. 22 gauge spinal needle was advanced to the suprapatellar bursa under fluoroscopic guidance. 1 ml of Lidocaine injected easily. A mixture of 0.2 ml Multihance and 40 mL of dilute Isovue M 200 was then used to opacify the knee joint. The knee was wrapped, the patient was taken to MRI. No immediate complication. IMPRESSION: Technically successful RIGHT knee injection for MRI. Electronically Signed   By: Staci Righter M.D.   On: 01/04/2018 16:11     PMFS History: Patient Active Problem List   Diagnosis Date Noted  . Acute medial meniscus tear of right knee 01/05/2018  . Lung nodule 02/11/2016  . Diabetes mellitus without complication ([place]) 23/53/6144  . Left lower lobe pneumonia ([place]) 02/08/2014  . BMI 35.0-35.9,adult 06/01/2012  . Visit for preventive health examination 06/01/2012  . Cough 07/09/2010  . ANGER 09/10/2009  . WEIGHT GAIN 09/10/2009  . KIDNEY STONE 02/05/2009  . Diabetes ([place]) 03/23/2007  . HYPERLIPIDEMIA, MIXED 03/23/2007  . OBESITY NOS 02/25/2007  . Essential hypertension 02/25/2007  . GERD 02/11/2007   Past Medical History:    Diagnosis Date  . ANEMIA DUE TO CHRONIC BLOOD LOSS 09/10/2009  . Diabetes mellitus   . Elevated BP   . GERD (gastroesophageal reflux disease)   . HYPERLIPIDEMIA, MIXED 03/23/2007  . Hypertension   . KIDNEY STONE 02/05/2009  . VENTRICULAR FUNCTION, DECREASED 06/21/2007    Family History  Problem Relation Age of Onset  . Graves' disease Mother   . Heart attack Father 60       COD  . Heart failure Father   . Kidney disease Father   . Diabetes Brother   . HIV Brother   . Heart attack Maternal Grandfather   . Heart attack Paternal Grandfather   . Heart disease Brother 22       CABG    No past surgical history on file. Social History   Occupational History    Employer: SELF-EMPLOYED    Comment: Does not work outside the home  Tobacco Use  . Smoking status: Former Research scientist (life sciences)  . Smokeless tobacco: Former Systems developer    Quit date: 07/28/1992  Substance and Sexual Activity  . Alcohol use: No  . Drug  use: No  . Sexual activity: Not on file

## 2018-01-18 ENCOUNTER — Telehealth (INDEPENDENT_AMBULATORY_CARE_PROVIDER_SITE_OTHER): Payer: Self-pay

## 2018-01-18 NOTE — Telephone Encounter (Signed)
Emailed dictation to [employer]

## 2018-01-18 NOTE — Telephone Encounter (Signed)
done

## 2018-01-18 NOTE — Telephone Encounter (Signed)
Please do an Addendum to note and include all of these things. Thank you.     Patient Name: Alfred Johnson Order #: [id number] Physician: Eduard Roux MD The patients insurance requires a few things in the notes to get approved. Can you get this to Dr Erlinda Hong and have him amend the note if he feels the patients evaluation met these requirements.  The following information is still needed:  Medical Records with [employer] Criteria:  For OA OTS Medical notes must have documentation of :-  . Documentation of severe osteoarthritis of the knee  . Knee pain limiting activities of daily living  . Documentation of failure to respond to medical therapy  i. What is considered medical therapy?  a. Injections, physical therapy  . Documentation of failure to respond to knee bracing with a neoprene sleeve (or any other device that attempts to stabilize)   Example: patient had severe OA limiting their activities of daily living. They have not responded to ( injections/PT) or a knee sleeve.  If he does amend the note please send it over to me.  Thanks,  [employer] and Librarian, academic [place] - a IT trainer? Loletha Grayer [phone] [email or web address]  [email or web address]

## 2018-02-10 ENCOUNTER — Ambulatory Visit (INDEPENDENT_AMBULATORY_CARE_PROVIDER_SITE_OTHER): Payer: 59 | Admitting: Endocrinology

## 2018-02-10 ENCOUNTER — Encounter: Payer: Self-pay | Admitting: Endocrinology

## 2018-02-10 VITALS — BP 130/88 | HR 89 | Ht 68.0 in | Wt 274.0 lb

## 2018-02-10 DIAGNOSIS — E119 Type 2 diabetes mellitus without complications: Secondary | ICD-10-CM | POA: Diagnosis not present

## 2018-02-10 LAB — POCT GLYCOSYLATED HEMOGLOBIN (HGB A1C): HEMOGLOBIN A1C: 6.8 % — AB (ref 4.0–5.6)

## 2018-02-10 NOTE — Progress Notes (Signed)
Subjective:    Patient ID: Alfred Johnson, male    DOB: [birth year], 47 y.o.   MRN: [id number]  HPI Pt returns for f/u of diabetes mellitus: DM type: 2 Dx'ed: 3790 Complications: none Therapy: 5 oral meds DKA: never Severe hypoglycemia: never Pancreatitis: never Other: he has never been on insulin; he declines weight loss surgery; he did not tolerate invokana (itching).   Interval history: He says cbg's are well-controlled.  pt states he feels well in general, except for heartburn.  prilosec helps.   Past Medical History:  Diagnosis Date  . ANEMIA DUE TO CHRONIC BLOOD LOSS 09/10/2009  . Diabetes mellitus   . Elevated BP   . GERD (gastroesophageal reflux disease)   . HYPERLIPIDEMIA, MIXED 03/23/2007  . Hypertension   . KIDNEY STONE 02/05/2009  . VENTRICULAR FUNCTION, DECREASED 06/21/2007    History reviewed. No pertinent surgical history.  Social History   Socioeconomic History  . Marital status: Married    Spouse name: Not on file  . Number of children: Not on file  . Years of education: Not on file  . Highest education level: Not on file  Occupational History    Employer: SELF-EMPLOYED    Comment: Does not work outside the home  Social Needs  . Financial resource strain: Not on file  . Food insecurity:    Worry: Not on file    Inability: Not on file  . Transportation needs:    Medical: Not on file    Non-medical: Not on file  Tobacco Use  . Smoking status: Former Research scientist (life sciences)  . Smokeless tobacco: Former Systems developer    Quit date: 07/28/1992  Substance and Sexual Activity  . Alcohol use: No  . Drug use: No  . Sexual activity: Not on file  Lifestyle  . Physical activity:    Days per week: Not on file    Minutes per session: Not on file  . Stress: Not on file  Relationships  . Social connections:    Talks on phone: Not on file    Gets together: Not on file    Attends religious service: Not on file    Active member of club or organization: Not on file    Attends  meetings of clubs or organizations: Not on file    Relationship status: Not on file  . Intimate partner violence:    Fear of current or ex partner: Not on file    Emotionally abused: Not on file    Physically abused: Not on file    Forced sexual activity: Not on file  Other Topics Concern  . 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     Current Outpatient Medications on File Prior to Visit  Medication Sig Dispense Refill  . aspirin 81 MG tablet Take 81 mg by mouth daily.      Marland Kitchen atorvastatin (LIPITOR) 40 MG tablet Take 40 mg by mouth daily. Take 1/2 20 mg daily    . bromocriptine (PARLODEL) 2.5 MG tablet TAKE 1/4 TABLET BY MOUTH DAILY 8 tablet 10  . cetirizine (ZYRTEC) 10 MG tablet Take 10 mg by mouth daily.    Marland Kitchen glucose blood (ONE TOUCH ULTRA TEST) test strip Check blood sugar 5 to 6 times  a day or as directed     . JANUMET XR 50-1000 MG TB24 TAKE 2 TABLETS BY MOUTH EVERY DAY 60 tablet 11  . JARDIANCE 25 MG TABS tablet TAKE 1 TABLET BY MOUTH EVERY DAY 30 tablet 9  . omeprazole (PRILOSEC) 20 MG capsule Take 20 mg by mouth daily.    . pioglitazone (ACTOS) 45 MG tablet Take 1 tablet (45 mg total) by mouth daily. 90 tablet 1   No current facility-administered medications on file prior to visit.     No Known Allergies  Family History  Problem Relation Age of Onset  . Graves' disease Mother   . Heart attack Father 103       COD  . Heart failure Father   . Kidney disease Father   . Diabetes Brother   . HIV Brother   . Heart attack Maternal Grandfather   . Heart attack Paternal Grandfather   . Heart disease Brother 43       CABG    BP 130/88   Pulse 89   Ht 5\' 8"  (1.727 m)   Wt 274 lb (124.3 kg)   SpO2 96%   BMI 41.66 kg/m    Review of Systems He denies hypoglycemia.      Objective:   Physical Exam VITAL  SIGNS:  See vs page GENERAL: no distress Pulses: foot pulses are intact bilaterally.   MSK: no deformity of the feet or ankles.  CV: no edema of the legs or ankles.  Skin:  no ulcer on the feet or ankles.  normal color and temp on the feet and ankles.   Neuro: sensation is intact to touch on the feet and ankles.     Lab Results  Component Value Date   HGBA1C 6.8 (A) 02/10/2018       Assessment & Plan:  Type 2 DM: well-controlled.  Heartburn, new, poss due to bromocriptine.  See PCP if persists.  Patient Instructions  check your blood sugar once a day.  vary the time of day when you check, between before the 3 meals, and at bedtime.  also check if you have symptoms of your blood sugar being too high or too low.  please keep a record of the readings and bring it to your next appointment here.  You can write it on any piece of paper.  please call us sooner if your blood sugar goes below 70, or if you have a lot of readings over 200.  Please continue the same medications for diabetes.   You may only need 10 mg of prilosec per day.   Please come back for a follow-up appointment in 6 months.

## 2018-02-10 NOTE — Patient Instructions (Addendum)
check your blood sugar once a day.  vary the time of day when you check, between before the 3 meals, and at bedtime.  also check if you have symptoms of your blood sugar being too high or too low.  please keep a record of the readings and bring it to your next appointment here.  You can write it on any piece of paper.  please call us sooner if your blood sugar goes below 70, or if you have a lot of readings over 200.  Please continue the same medications for diabetes.   You may only need 10 mg of prilosec per day.   Please come back for a follow-up appointment in 6 months.

## 2018-02-25 ENCOUNTER — Telehealth (INDEPENDENT_AMBULATORY_CARE_PROVIDER_SITE_OTHER): Payer: Self-pay | Admitting: Orthopaedic Surgery

## 2018-02-25 NOTE — Telephone Encounter (Signed)
See message below °

## 2018-02-25 NOTE — Telephone Encounter (Signed)
Patient came into the clinic stating that he is ready to have his surgery.  He would like to schedule it in September.  He also stated that the insurance was very difficult getting his approval when he has had this surgery before.  CB#[phone].  Thank you.

## 2018-02-25 NOTE — Telephone Encounter (Signed)
Knee scope PMM and synovectomy.  Lonza shaver.  #1 OSC #2 cone day.  Thanks.

## 2018-03-05 NOTE — Telephone Encounter (Signed)
I called patient 

## 2018-03-12 ENCOUNTER — Encounter (INDEPENDENT_AMBULATORY_CARE_PROVIDER_SITE_OTHER): Payer: Self-pay | Admitting: Orthopaedic Surgery

## 2018-03-12 ENCOUNTER — Ambulatory Visit (INDEPENDENT_AMBULATORY_CARE_PROVIDER_SITE_OTHER): Payer: 59 | Admitting: Orthopaedic Surgery

## 2018-03-12 DIAGNOSIS — S83241A Other tear of medial meniscus, current injury, right knee, initial encounter: Secondary | ICD-10-CM | POA: Diagnosis not present

## 2018-03-12 NOTE — Progress Notes (Signed)
Office Visit Note   Patient: Alfred Johnson           Date of Birth: 10/02/1970           MRN: [id number] Visit Date: 03/12/2018              Requested by: Burnis Medin, MD [place], [place] [address] PCP: Burnis Medin, MD   Assessment & Plan: Visit Diagnoses:  1. Acute medial meniscus tear of right knee, initial encounter     Plan: I again reviewed the MRI with the patient and his wife today.  He does have an extruded acute medial meniscal tear which would benefit from arthroscopic debridement.  We again discussed the risks and benefits and postoperative rehab and recovery.  We will schedule him in the near future. Total face to face encounter time was greater than 25 minutes and over half of this time was spent in counseling and/or coordination of care.  Follow-Up Instructions: Return if symptoms worsen or fail to improve.   Orders:  No orders of the defined types were placed in this encounter.  No orders of the defined types were placed in this encounter.     Procedures: No procedures performed   Clinical Data: No additional findings.   Subjective: Chief Complaint  Patient presents with  . Right Knee - Pain    Alfred Johnson today for continued left knee pain.  He has been wearing a medial unloader brace which does help with some of his symptoms but he still has significant right knee pain on the medial side.  We discussed his injury further and he states that about a couple weeks before my first encounter with him on May 31 of this year he had a reinjury of his right knee while he was descending his stairs and he felt immediate pain and symptoms on the medial side of his knee.  Since then the pain has been unrelenting.   Review of Systems   Objective: Vital Signs: There were no vitals taken for this visit.  Physical Exam  Ortho Exam Right knee exam shows a small joint effusion with positive McMurray and positive Thessaly along the  medial joint line. Specialty Comments:  No specialty comments available.  Imaging: No results found.   PMFS History: Patient Active Problem List   Diagnosis Date Noted  . Acute medial meniscus tear of right knee 01/05/2018  . Lung nodule 02/11/2016  . Diabetes mellitus without complication ([place]) 97/67/3419  . Left lower lobe pneumonia ([place]) 02/08/2014  . BMI 35.0-35.9,adult 06/01/2012  . Visit for preventive health examination 06/01/2012  . Cough 07/09/2010  . ANGER 09/10/2009  . WEIGHT GAIN 09/10/2009  . KIDNEY STONE 02/05/2009  . Diabetes ([place]) 03/23/2007  . HYPERLIPIDEMIA, MIXED 03/23/2007  . OBESITY NOS 02/25/2007  . Essential hypertension 02/25/2007  . GERD 02/11/2007   Past Medical History:  Diagnosis Date  . ANEMIA DUE TO CHRONIC BLOOD LOSS 09/10/2009  . Diabetes mellitus   . Elevated BP   . GERD (gastroesophageal reflux disease)   . HYPERLIPIDEMIA, MIXED 03/23/2007  . Hypertension   . KIDNEY STONE 02/05/2009  . VENTRICULAR FUNCTION, DECREASED 06/21/2007    Family History  Problem Relation Age of Onset  . Graves' disease Mother   . Heart attack Father 76       COD  . Heart failure Father   . Kidney disease Father   . Diabetes Brother   . HIV Brother   .  Heart attack Maternal Grandfather   . Heart attack Paternal Grandfather   . Heart disease Brother 28       CABG    History reviewed. No pertinent surgical history. Social History   Occupational History    Employer: SELF-EMPLOYED    Comment: Does not work outside the home  Tobacco Use  . Smoking status: Former Research scientist (life sciences)  . Smokeless tobacco: Former Systems developer    Quit date: 07/28/1992  Substance and Sexual Activity  . Alcohol use: No  . Drug use: No  . Sexual activity: Not on file

## 2018-03-19 ENCOUNTER — Encounter: Payer: Self-pay | Admitting: Internal Medicine

## 2018-03-19 ENCOUNTER — Ambulatory Visit (INDEPENDENT_AMBULATORY_CARE_PROVIDER_SITE_OTHER): Payer: 59 | Admitting: Internal Medicine

## 2018-03-19 VITALS — BP 142/86 | HR 76 | Ht 70.0 in | Wt 278.2 lb

## 2018-03-19 DIAGNOSIS — E119 Type 2 diabetes mellitus without complications: Secondary | ICD-10-CM | POA: Diagnosis not present

## 2018-03-19 DIAGNOSIS — I10 Essential (primary) hypertension: Secondary | ICD-10-CM

## 2018-03-19 DIAGNOSIS — E782 Mixed hyperlipidemia: Secondary | ICD-10-CM

## 2018-03-19 LAB — LIPID PANEL
CHOL/HDL RATIO: 3.2 ratio (ref 0.0–5.0)
Cholesterol, Total: 123 mg/dL (ref 100–199)
HDL: 38 mg/dL — AB (ref >39)
LDL Calculated: 69 mg/dL (ref 0–99)
Triglycerides: 82 mg/dL (ref 0–149)
VLDL Cholesterol Cal: 16 mg/dL (ref 5–40)

## 2018-03-19 NOTE — Progress Notes (Signed)
OFFICE NOTE  Chief Complaint:  No complaints  Primary Care Physician: Burnis Medin, MD  HPI:  Alfred Johnson is a pleasant 47 year old male who is coming referred to me by Dr. Regis Bill for evaluation of coronary disease. His past history significant for diabetes followed by Dr. Loanne Drilling in endocrinology. He does not have hypertension and is not on treatment for any dyslipidemia. His last lipid profile showed an LDL cholesterol 95 with total cholesterol less than 160. He does have a strong family history of heart disease with heart attacks in his father and both grandfathers. He also recently had a brother who had 3 vessel, bypass one month ago. Since that time he's been having some intermittent symptoms with vague, atypical sounding chest pain and occasional lightheadedness. He did have a pneumonia last summer. Fortunately, he is very active. He's been doing significant exercise several days a week including both aerobic and resistance training. He's managed to have no symptoms such as chest pain or worsening shortness of breath during these exercises.  Alfred Johnson returns today for follow-up. He underwent a CT coronary artery calcium score. This resulted back is 0. Unfortunately he was noted to have some small pulmonary nodules. He will need a repeat plain chest CT in one year. He also obtained a cholesterol profile which demonstrated an LDL-P of 1566, LDL-C of 117, HDL 45, triglycerides 64 and total cholesterol 175. Although he has no chronic calcium, the diabetic he should have risk factor modification. We talked about cholesterol medications and he did research with his insurance company as to what medications would be the most cost effective. He is interested in taking Crestor which I think is a good medication.  02/11/2016  Alfred Johnson was seen today in follow-up. Over the past year he's done fairly well. His blood sugars however have been elevated and recently he was started on Invokana  in addition to his other diabetes medicines. Blood pressure appears well controlled today. He denies any chest pain or worsening shortness of breath. Of note his PCP recheck his cholesterol November 2016 which showed a total cholesterol of 86, triglycerides 47, HDL 40 and LDL of 36. This represents significant cholesterol control.  02/17/2017  Alfred Johnson returns today for follow-up. Spent a year since I last saw him. As a recap he had an initial coronary artery calcium score which was 0 however subsequently he had a follow-up of a small pulmonary nodule which was found to be stable, but the radiologist indicated there was a small speck of calcium in the LAD. Either this is relevant however since he is diagnosed as diabetic. He is at high risk and therefore will need a strict cholesterol control with a goal LDL-C less than 70. He was initially placed on atorvastatin 40 mg daily however did have side effects including myalgias on that dose. I asked him to reduce the dose to 20 mg daily a says is tolerating this just fine. He reports taking the medicine regularly. Unfortunately has had some weight gain and says that his diet is "horrible". He says that he goes in phases as far as exercise and diet. I told him that we would really want to be aggressive, particularly looking at his lifetime risk, which he found to be funny. He said that no males in his family have lived to be greater than 67 years old. Despite that, he does want to live a longer, healthier life.  03/19/2018  Alfred Johnson is seen today in  follow-up.  Overall he is doing well without complaints.  Denies any chest pain or worsening shortness of breath.  He is due for repeat lipid profile.  He said his hemoglobin A1c is now down to 6.8.  Recently was started on low-dose bromocriptine which may show some additional benefit apparently diabetics.  Unfortunately not been able to lose a lot of weight.  Is been struggling with a meniscal tear in his knee  which is been repaired and will likely need to be repaired again.   PMHx:  Past Medical History:  Diagnosis Date  . ANEMIA DUE TO CHRONIC BLOOD LOSS 09/10/2009  . Diabetes mellitus   . Elevated BP   . GERD (gastroesophageal reflux disease)   . HYPERLIPIDEMIA, MIXED 03/23/2007  . Hypertension   . KIDNEY STONE 02/05/2009  . VENTRICULAR FUNCTION, DECREASED 06/21/2007    No past surgical history on file.  FAMHx:  Family History  Problem Relation Age of Onset  . Graves' disease Mother   . Heart attack Father 87       COD  . Heart failure Father   . Kidney disease Father   . Diabetes Brother   . HIV Brother   . Heart attack Maternal Grandfather   . Heart attack Paternal Grandfather   . Heart disease Brother 74       CABG    SOCHx:   reports that he has quit smoking. He quit smokeless tobacco use about 25 years ago. He reports that he does not drink alcohol or use drugs.  ALLERGIES:  No Known Allergies  ROS: Pertinent items noted in HPI and remainder of comprehensive ROS otherwise negative.  HOME MEDS: Current Outpatient Medications  Medication Sig Dispense Refill  . aspirin 81 MG tablet Take 81 mg by mouth daily.      Marland Kitchen atorvastatin (LIPITOR) 40 MG tablet Take 40 mg by mouth daily. Take 1/2 20 mg daily    . bromocriptine (PARLODEL) 2.5 MG tablet TAKE 1/4 TABLET BY MOUTH DAILY 8 tablet 10  . cetirizine (ZYRTEC) 10 MG tablet Take 10 mg by mouth daily.    Marland Kitchen glucose blood (ONE TOUCH ULTRA TEST) test strip Check blood sugar 5 to 6 times a day or as directed     . JANUMET XR 50-1000 MG TB24 TAKE 2 TABLETS BY MOUTH EVERY DAY 60 tablet 11  . JARDIANCE 25 MG TABS tablet TAKE 1 TABLET BY MOUTH EVERY DAY 30 tablet 9  . omeprazole (PRILOSEC) 20 MG capsule Take 10 mg by mouth daily.     . pioglitazone (ACTOS) 45 MG tablet Take 1 tablet (45 mg total) by mouth daily. 90 tablet 1   No current facility-administered medications for this visit.     LABS/IMAGING: No results found for  this or any previous visit (from the past 48 hour(s)). No results found.  WEIGHTS: Wt Readings from Last 3 Encounters:  03/19/18 278 lb 3.2 oz (126.2 kg)  02/10/18 274 lb (124.3 kg)  01/05/18 277 lb (125.6 kg)    VITALS: Ht 5\' 10"  (1.778 m)   Wt 278 lb 3.2 oz (126.2 kg)   BMI 39.92 kg/m   EXAM: General appearance: alert, no distress and moderately obese Neck: no carotid bruit, no JVD and thyroid not enlarged, symmetric, no tenderness/mass/nodules Lungs: clear to auscultation bilaterally Heart: regular rate and rhythm, S1, S2 normal, no murmur, click, rub or gallop Abdomen: soft, non-tender; bowel sounds normal; no masses,  no organomegaly Extremities: extremities normal, atraumatic, no cyanosis or  edema Pulses: 2+ and symmetric Skin: Skin color, texture, turgor normal. No rashes or lesions Neurologic: Grossly normal Psych: Pleasant  EKG: Dermal sinus rhythm 74-personally reviewed  ASSESSMENT: 1. Atypical chest pain - Zero CAC 2. Strong family history of coronary disease 3. Type 2 diabetes 4. Obesity 5. Dyslipidemia 6. Solitary pulmonary nodule - benign  PLAN: 1.   Alfred Johnson is mitigating his coronary risk but did have essentially 0 coronary artery calcium score although a repeat follow-up of pulmonary nodule study did show a speck of calcium in the LAD.  He does have a strong fifth family history of heart disease and will continue to shoot for goal LDL less than 70.  We will repeat a lipid profile today.  His diabetes is better controlled with hemoglobin A1c of 6.8 and he is on Jardiance for mortality benefit if he were to have coronary disease.  He will continue to work on the weight loss and hopefully have some improvement after repeat knee surgery with his chronic knee pain.  Follow-up with me annually or sooner as necessary.  Pixie Casino, MD, [facility], [place] Director of the Advanced Lipid Disorders &  Cardiovascular Risk  Reduction Clinic Diplomate of the American Board of Clinical Lipidology Attending Cardiologist  Direct Dial: [phone]  Fax: [phone]  Website:  [email or web address] 03/19/2018, 8:09 AM

## 2018-03-19 NOTE — Patient Instructions (Addendum)
Your physician recommends that you return for lab work FASTING to check cholesterol   Your physician wants you to follow-up in: ONE YEAR with Dr. Hilty. You will receive a reminder letter in the mail two months in advance. If you don't receive a letter, please call our office to schedule the follow-up appointment.   

## 2018-03-25 ENCOUNTER — Telehealth (INDEPENDENT_AMBULATORY_CARE_PROVIDER_SITE_OTHER): Payer: Self-pay | Admitting: Orthopaedic Surgery

## 2018-03-25 NOTE — Telephone Encounter (Signed)
Patient would like to get surgery scheduled with Dr. Erlinda Hong, said he has left multiple messages but really would like to speak with you as soon as possible.  Mobile # [phone]  Home # [phone]

## 2018-03-25 NOTE — Telephone Encounter (Signed)
Just the aspirin

## 2018-03-25 NOTE — Telephone Encounter (Signed)
I called patient and scheduled surgery. 

## 2018-03-25 NOTE — Telephone Encounter (Signed)
See message.

## 2018-03-25 NOTE — Telephone Encounter (Signed)
Patients surgery set up for 9/5. He needs to know what medications he should stop taking and when. He states he has already stopped taking the 81 mg aspirin on 8/27. Please advise # [phone] * can leave a vm with all info

## 2018-03-26 NOTE — Telephone Encounter (Signed)
Called to let him know to stop aspirin per Dr Erlinda Hong. States he stopped it 03/23/18.

## 2018-04-01 ENCOUNTER — Encounter (INDEPENDENT_AMBULATORY_CARE_PROVIDER_SITE_OTHER): Payer: Self-pay | Admitting: Orthopaedic Surgery

## 2018-04-01 DIAGNOSIS — S83231A Complex tear of medial meniscus, current injury, right knee, initial encounter: Secondary | ICD-10-CM | POA: Diagnosis not present

## 2018-04-14 ENCOUNTER — Ambulatory Visit: Payer: 59 | Admitting: Endocrinology

## 2018-04-15 ENCOUNTER — Ambulatory Visit (INDEPENDENT_AMBULATORY_CARE_PROVIDER_SITE_OTHER): Payer: 59 | Admitting: Physician Assistant

## 2018-04-15 ENCOUNTER — Encounter (INDEPENDENT_AMBULATORY_CARE_PROVIDER_SITE_OTHER): Payer: Self-pay | Admitting: Orthopaedic Surgery

## 2018-04-15 DIAGNOSIS — Z9889 Other specified postprocedural states: Secondary | ICD-10-CM

## 2018-04-15 NOTE — Progress Notes (Signed)
   Post-Op Visit Note   Patient: Alfred Johnson           Date of Birth: 17-Feb-1971           MRN: [id number] Visit Date: 04/15/2018 PCP: Burnis Medin, MD   Assessment & Plan:  Chief Complaint:  Chief Complaint  Patient presents with  . Right Knee - Routine Post Op   Visit Diagnoses:  1. S/P right knee arthroscopy     Plan: Patient is a pleasant 47 year old gentleman who presents to our clinic today 2 weeks status post right knee medial meniscectomy.  Doing well.  Minimal pain.  He has been icing and elevating for pain and swelling.  No fevers, chills or any other systemic symptoms.  Examination of his right knee reveals well-healing surgical portals with nylon sutures in place.  No evidence of cellulitis or infection.  He does have a small effusion to the knee.  Calf is soft and nontender.  Negative Homans.  He is neurovascularly intact distally.  At this point, nylon sutures were removed.  He will advance with activity as tolerated.  Range of motion exercises provided today.  Follow-up with [place] in 4 weeks time for recheck.  Follow-Up Instructions: Return in about 4 weeks (around 05/13/2018).   Orders:  No orders of the defined types were placed in this encounter.  No orders of the defined types were placed in this encounter.   Imaging: No new imaging  PMFS History: Patient Active Problem List   Diagnosis Date Noted  . S/P right knee arthroscopy 04/15/2018  . Acute medial meniscus tear of right knee 01/05/2018  . Lung nodule 02/11/2016  . Diabetes mellitus without complication ([place]) 63/14/9702  . Left lower lobe pneumonia ([place]) 02/08/2014  . BMI 35.0-35.9,adult 06/01/2012  . Visit for preventive health examination 06/01/2012  . Cough 07/09/2010  . ANGER 09/10/2009  . WEIGHT GAIN 09/10/2009  . KIDNEY STONE 02/05/2009  . Diabetes ([place]) 03/23/2007  . HYPERLIPIDEMIA, MIXED 03/23/2007  . OBESITY NOS 02/25/2007  . Essential hypertension 02/25/2007  . GERD 02/11/2007     Past Medical History:  Diagnosis Date  . ANEMIA DUE TO CHRONIC BLOOD LOSS 09/10/2009  . Diabetes mellitus   . Elevated BP   . GERD (gastroesophageal reflux disease)   . HYPERLIPIDEMIA, MIXED 03/23/2007  . Hypertension   . KIDNEY STONE 02/05/2009  . VENTRICULAR FUNCTION, DECREASED 06/21/2007    Family History  Problem Relation Age of Onset  . Graves' disease Mother   . Heart attack Father 65       COD  . Heart failure Father   . Kidney disease Father   . Diabetes Brother   . HIV Brother   . Heart attack Maternal Grandfather   . Heart attack Paternal Grandfather   . Heart disease Brother 58       CABG    History reviewed. No pertinent surgical history. Social History   Occupational History    Employer: SELF-EMPLOYED    Comment: Does not work outside the home  Tobacco Use  . Smoking status: Former Research scientist (life sciences)  . Smokeless tobacco: Former Systems developer    Quit date: 07/28/1992  Substance and Sexual Activity  . Alcohol use: No  . Drug use: No  . Sexual activity: Not on file

## 2018-04-17 ENCOUNTER — Other Ambulatory Visit: Payer: Self-pay | Admitting: Endocrinology

## 2018-04-22 ENCOUNTER — Other Ambulatory Visit: Payer: Self-pay | Admitting: Internal Medicine

## 2018-05-11 LAB — HM DIABETES EYE EXAM

## 2018-05-13 ENCOUNTER — Encounter (INDEPENDENT_AMBULATORY_CARE_PROVIDER_SITE_OTHER): Payer: Self-pay | Admitting: Orthopaedic Surgery

## 2018-05-13 ENCOUNTER — Ambulatory Visit (INDEPENDENT_AMBULATORY_CARE_PROVIDER_SITE_OTHER): Payer: 59 | Admitting: Physician Assistant

## 2018-05-13 ENCOUNTER — Other Ambulatory Visit: Payer: Self-pay | Admitting: Endocrinology

## 2018-05-13 DIAGNOSIS — Z9889 Other specified postprocedural states: Secondary | ICD-10-CM

## 2018-05-13 NOTE — Progress Notes (Signed)
   Post-Op Visit Note   Patient: Alfred Johnson           Date of Birth: 1970/08/28           MRN: [id number] Visit Date: 05/13/2018 PCP: Burnis Medin, MD   Assessment & Plan:  Chief Complaint:  Chief Complaint  Patient presents with  . Right Knee - Pain   Visit Diagnoses:  1. S/P right knee arthroscopy     Plan: Patient is a pleasant 47 year old gentleman who presents to our clinic today approximately 6 weeks status post right knee arthroscopic debridement medial meniscus tear.  He has been doing well.  Occasionally, he notes pain to the medial aspect but this is becoming less frequent.  He has increased his activity level without issues.  No fevers or chills.  Examination of the right knee reveals well healed surgical portals without evidence of infection.  Range of motion 0 to 125 degrees.  Near full strength.  He is neurovascularly intact distally.  At this point, he will continue working on strengthening exercises.  He will follow-up with [place] as needed.  Call with concerns or questions.  Follow-Up Instructions: Return if symptoms worsen or fail to improve.   Orders:  No orders of the defined types were placed in this encounter.  No orders of the defined types were placed in this encounter.   Imaging: No new imaging  PMFS History: Patient Active Problem List   Diagnosis Date Noted  . S/P right knee arthroscopy 04/15/2018  . Acute medial meniscus tear of right knee 01/05/2018  . Lung nodule 02/11/2016  . Diabetes mellitus without complication ([place]) 75/91/6384  . Left lower lobe pneumonia ([place]) 02/08/2014  . BMI 35.0-35.9,adult 06/01/2012  . Visit for preventive health examination 06/01/2012  . Cough 07/09/2010  . ANGER 09/10/2009  . WEIGHT GAIN 09/10/2009  . KIDNEY STONE 02/05/2009  . Diabetes ([place]) 03/23/2007  . HYPERLIPIDEMIA, MIXED 03/23/2007  . OBESITY NOS 02/25/2007  . Essential hypertension 02/25/2007  . GERD 02/11/2007   Past Medical History:    Diagnosis Date  . ANEMIA DUE TO CHRONIC BLOOD LOSS 09/10/2009  . Diabetes mellitus   . Elevated BP   . GERD (gastroesophageal reflux disease)   . HYPERLIPIDEMIA, MIXED 03/23/2007  . Hypertension   . KIDNEY STONE 02/05/2009  . VENTRICULAR FUNCTION, DECREASED 06/21/2007    Family History  Problem Relation Age of Onset  . Graves' disease Mother   . Heart attack Father 4       COD  . Heart failure Father   . Kidney disease Father   . Diabetes Brother   . HIV Brother   . Heart attack Maternal Grandfather   . Heart attack Paternal Grandfather   . Heart disease Brother 25       CABG    History reviewed. No pertinent surgical history. Social History   Occupational History    Employer: SELF-EMPLOYED    Comment: Does not work outside the home  Tobacco Use  . Smoking status: Former Research scientist (life sciences)  . Smokeless tobacco: Former Systems developer    Quit date: 07/28/1992  Substance and Sexual Activity  . Alcohol use: No  . Drug use: No  . Sexual activity: Not on file

## 2018-05-20 ENCOUNTER — Ambulatory Visit (INDEPENDENT_AMBULATORY_CARE_PROVIDER_SITE_OTHER): Payer: 59 | Admitting: Family Medicine

## 2018-05-20 ENCOUNTER — Encounter: Payer: Self-pay | Admitting: Family Medicine

## 2018-05-20 VITALS — BP 100/74 | HR 102 | Temp 98.3°F | Wt 278.0 lb

## 2018-05-20 DIAGNOSIS — J40 Bronchitis, not specified as acute or chronic: Secondary | ICD-10-CM | POA: Diagnosis not present

## 2018-05-20 MED ORDER — AMOXICILLIN 500 MG PO CAPS: 500.0000 mg | ORAL_CAPSULE | Freq: Two times a day (BID) | ORAL | 0 refills | Status: AC

## 2018-05-20 NOTE — Progress Notes (Signed)
Subjective:    Patient ID: Alfred Johnson, male    DOB: 17-Jun-1971, 47 y.o.   MRN: [id number]  No chief complaint on file.   HPI Patient was seen today for acute concern.  Patient typically seen by Dr. Regis Bill.  Pt notes productive "hacking" cough and congestion x1 week.  Pt also endorses ear pain/pressure, scratchy throat, rhinorrhea, headache when symptoms initially started.  Has tried nasal lavage and Sudafed for symptoms.  Unsure about sick contacts.  Past Medical History:  Diagnosis Date  . ANEMIA DUE TO CHRONIC BLOOD LOSS 09/10/2009  . Diabetes mellitus   . Elevated BP   . GERD (gastroesophageal reflux disease)   . HYPERLIPIDEMIA, MIXED 03/23/2007  . Hypertension   . KIDNEY STONE 02/05/2009  . VENTRICULAR FUNCTION, DECREASED 06/21/2007    No Known Allergies  ROS General: Denies fever, chills, night sweats, changes in weight, changes in appetite HEENT: Denies ear pain, changes in vision+ congestion, scratchy throat, rhinorrhea, headache when symptoms started CV: Denies CP, palpitations, SOB, orthopnea Pulm: Denies SOB, wheezing  + continued productive cough GI: Denies abdominal pain, nausea, vomiting, diarrhea, constipation GU: Denies dysuria, hematuria, frequency, vaginal discharge Msk: Denies muscle cramps, joint pains Neuro: Denies weakness, numbness, tingling Skin: Denies rashes, bruising Psych: Denies depression, anxiety, hallucinations   Objective:    Blood pressure 100/74, pulse (!) 102, temperature 98.3 F (36.8 C), temperature source Oral, weight 278 lb (126.1 kg), SpO2 95 %.  Gen. Pleasant, well-nourished, in no distress, normal affect  HEENT: [place]/AT, face symmetric, no scleral icterus, PERRLA, nares patent without drainage, pharynx without erythema or exudate.  TMs normal b/l.  Mild cervical lymphadenopathy Lungs: Cough, no accessory muscle use, CTAB, faint wheeze in bases Cardiovascular: RRR, no m/r/g, no peripheral edema  Wt Readings from Last 3 Encounters:   05/20/18 278 lb (126.1 kg)  03/19/18 278 lb 3.2 oz (126.2 kg)  02/10/18 274 lb (124.3 kg)    Lab Results  Component Value Date   WBC 6.4 05/30/2015   HGB 14.2 05/30/2015   HCT 43.2 05/30/2015   PLT 255.0 05/30/2015   GLUCOSE 129 (H) 06/02/2017   CHOL 123 03/19/2018   TRIG 82 03/19/2018   HDL 38 (L) 03/19/2018   LDLDIRECT 114.5 02/26/2007   LDLCALC 69 03/19/2018   ALT 18 06/02/2017   AST 11 06/02/2017   NA 139 06/02/2017   K 4.6 06/02/2017   CL 104 06/02/2017   CREATININE 0.80 06/02/2017   BUN 21 06/02/2017   CO2 25 06/02/2017   TSH 1.47 05/30/2015   PSA 0.37 06/08/2015   HGBA1C 6.8 (A) 02/10/2018   MICROALBUR 0.7 04/14/2017    Assessment/Plan:  Bronchitis  -given handout -discussed likely disease course -discussed using amoxicillin as a wait and see rx.   -Okay to gargle with warm salt water or Chloraseptic spray for scratchy/irritated throat, okay to use OTC Mucinex. -Hydration encouraged - Plan: amoxicillin (AMOXIL) 500 MG capsule Follow-up PRN  Grier Mitts, MD

## 2018-05-20 NOTE — Patient Instructions (Addendum)
You can use over-the-counter Mucinex for your cough.  It can be found on the shelf at your local drugstore.  Continue to stay hydrated by drinking plenty of fluids.  You can also gargle with warm salt water or Chloraseptic spray for sore throat.  Local honey is also good for cough.  Acute Bronchitis, Adult Acute bronchitis is sudden (acute) swelling of the air tubes (bronchi) in the lungs. Acute bronchitis causes these tubes to fill with mucus, which can make it hard to breathe. It can also cause coughing or wheezing. In adults, acute bronchitis usually goes away within 2 weeks. A cough caused by bronchitis may last up to 3 weeks. Smoking, allergies, and asthma can make the condition worse. Repeated episodes of bronchitis may cause further lung problems, such as chronic obstructive pulmonary disease (COPD). What are the causes? This condition can be caused by germs and by substances that irritate the lungs, including:  Cold and flu viruses. This condition is most often caused by the same virus that causes a cold.  Bacteria.  Exposure to tobacco smoke, dust, fumes, and air pollution.  What increases the risk? This condition is more likely to develop in people who:  Have close contact with someone with acute bronchitis.  Are exposed to lung irritants, such as tobacco smoke, dust, fumes, and vapors.  Have a weak immune system.  Have a respiratory condition such as asthma.  What are the signs or symptoms? Symptoms of this condition include:  A cough.  Coughing up clear, yellow, or green mucus.  Wheezing.  Chest congestion.  Shortness of breath.  A fever.  Body aches.  Chills.  A sore throat.  How is this diagnosed? This condition is usually diagnosed with a physical exam. During the exam, your health care provider may order tests, such as chest X-rays, to rule out other conditions. He or she may also:  Test a sample of your mucus for bacterial infection.  Check the  level of oxygen in your blood. This is done to check for pneumonia.  Do a chest X-ray or lung function testing to rule out pneumonia and other conditions.  Perform blood tests.  Your health care provider will also ask about your symptoms and medical history. How is this treated? Most cases of acute bronchitis clear up over time without treatment. Your health care provider may recommend:  Drinking more fluids. Drinking more makes your mucus thinner, which may make it easier to breathe.  Taking a medicine for a fever or cough.  Taking an antibiotic medicine.  Using an inhaler to help improve shortness of breath and to control a cough.  Using a cool mist vaporizer or humidifier to make it easier to breathe.  Follow these instructions at home: Medicines  Take over-the-counter and prescription medicines only as told by your health care provider.  If you were prescribed an antibiotic, take it as told by your health care provider. Do not stop taking the antibiotic even if you start to feel better. General instructions  Get plenty of rest.  Drink enough fluids to keep your urine clear or pale yellow.  Avoid smoking and secondhand smoke. Exposure to cigarette smoke or irritating chemicals will make bronchitis worse. If you smoke and you need help quitting, ask your health care provider. Quitting smoking will help your lungs heal faster.  Use an inhaler, cool mist vaporizer, or humidifier as told by your health care provider.  Keep all follow-up visits as told by your health care  provider. This is important. How is this prevented? To lower your risk of getting this condition again:  Wash your hands often with soap and water. If soap and water are not available, use hand sanitizer.  Avoid contact with people who have cold symptoms.  Try not to touch your hands to your mouth, nose, or eyes.  Make sure to get the flu shot every year.  Contact a health care provider if:  Your  symptoms do not improve in 2 weeks of treatment. Get help right away if:  You cough up blood.  You have chest pain.  You have severe shortness of breath.  You become dehydrated.  You faint or keep feeling like you are going to faint.  You keep vomiting.  You have a severe headache.  Your fever or chills gets worse. This information is not intended to replace advice given to you by your health care provider. Make sure you discuss any questions you have with your health care provider. Document Released: 08/21/2004 Document Revised: 02/06/2016 Document Reviewed: 01/02/2016 Elsevier Interactive Patient Education  [employer].

## 2018-06-28 NOTE — Progress Notes (Signed)
Chief Complaint  Patient presents with  . Cough    X50months     HPI: Alfred Johnson 47 y.o. come in for problem with cough  7 weeks of rx  and saw Dr. Volanda Napoleon.  coughing since then  .  Onset  Like feeling off . A little crummy   And then had resp sx .  Oct 6  [place]. Can be very hard  Dry  About the same   X  Nature  of cough   Gets  spasms.  No longer awakening s now  But  Cough in am .  Using cough drops now  .No  Smoke since 1996 about 1pp week .  ROS: See pertinent positives and negatives per HPI.some sob no wheezing minimal ur congestion no fever  No hemoptysis but sometimes spasm is a lot and  Poss pink tinge in am fromupper  Sx . Failed otc meds and mucinex etc  On 1/2 prilosec for reflux  From the bromocriptine  And helps  Is in rehab exercise after knee walks [facility] setc   Past Medical History:  Diagnosis Date  . ANEMIA DUE TO CHRONIC BLOOD LOSS 09/10/2009  . Diabetes mellitus   . Elevated BP   . GERD (gastroesophageal reflux disease)   . HYPERLIPIDEMIA, MIXED 03/23/2007  . Hypertension   . KIDNEY STONE 02/05/2009  . VENTRICULAR FUNCTION, DECREASED 06/21/2007    Family History  Problem Relation Age of Onset  . Graves' disease Mother   . Heart attack Father 99       COD  . Heart failure Father   . Kidney disease Father   . Diabetes Brother   . HIV Brother   . Heart attack Maternal Grandfather   . Heart attack Paternal Grandfather   . Heart disease Brother 39       CABG    Social History   Socioeconomic History  . Marital status: Married    Spouse name: Not on file  . Number of children: Not on file  . Years of education: Not on file  . Highest education level: Not on file  Occupational History    Employer: SELF-EMPLOYED    Comment: Does not work outside the home  Social Needs  . Financial resource strain: Not on file  . Food insecurity:    Worry: Not on file    Inability: Not on file  . Transportation needs:    Medical: Not on file    Non-medical:  Not on file  Tobacco Use  . Smoking status: Former Research scientist (life sciences)  . Smokeless tobacco: Former Systems developer    Quit date: 07/28/1992  Substance and Sexual Activity  . Alcohol use: No  . Drug use: No  . Sexual activity: Not on file  Lifestyle  . Physical activity:    Days per week: Not on file    Minutes per session: Not on file  . Stress: Not on file  Relationships  . Social connections:    Talks on phone: Not on file    Gets together: Not on file    Attends religious service: Not on file    Active member of club or organization: Not on file    Attends meetings of clubs or organizations: Not on file    Relationship status: Not on file  Other Topics Concern  . 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     Outpatient Medications Prior to Visit  Medication Sig Dispense Refill  . aspirin 81 MG tablet Take 81 mg by mouth daily.      Marland Kitchen atorvastatin (LIPITOR) 40 MG tablet Take 40 mg by mouth daily. Take 1/2 20 mg daily    . bromocriptine (PARLODEL) 2.5 MG tablet TAKE 1/4 TABLET BY MOUTH DAILY 8 tablet 10  . cetirizine (ZYRTEC) 10 MG tablet Take 10 mg by mouth daily.    Marland Kitchen glucose blood (ONE TOUCH ULTRA TEST) test strip Check blood sugar 5 to 6 times a day or as directed     . JANUMET XR 50-1000 MG TB24 TAKE 2 TABLETS BY MOUTH EVERY DAY 60 tablet 11  . JARDIANCE 25 MG TABS tablet TAKE 1 TABLET BY MOUTH EVERY DAY 30 tablet 9  . omeprazole (PRILOSEC) 20 MG capsule Take 10 mg by mouth daily.     . pioglitazone (ACTOS) 45 MG tablet TAKE 1 TABLET (45 MG TOTAL) BY MOUTH DAILY. 90 tablet 1   No facility-administered medications prior to visit.    s   EXAM:  BP (!) 146/92 (BP Location: Right Arm, Patient Position: Sitting, Cuff Size: Large)   Pulse (!) 102   Temp (!) 97.5 F (36.4 C) (Oral)   Ht 5\' 10"  (1.778 m)   Wt 275 lb 3.2 oz (124.8  kg)   SpO2 97%   BMI 39.49 kg/m   Body mass index is 39.49 kg/m.  GENERAL: vitals reviewed and listed above, alert, oriented, appears well hydrated and in no acute distress  Episodic bronchial cough spasms  Dry   In nad  HEENT: atraumatic, conjunctiva  clear, no obvious abnormalities on inspection of external nose and ears tm clear  nsoes mild congestion no pain OP : no lesion edema or exudate  NECK: no obvious masses on inspection palpation  LUNGS: clear to auscultation bilaterally, no wheezes, rales or rhonchi, good air movement CV: HRRR, no clubbing cyanosis or  peripheral edema nl cap refill  Abdomen:  Sof,t normal bowel sounds without hepatosplenomegaly, no guarding rebound or masses no CVA tenderness MS: moves all extremities without noticeable focal  abnormality PSYCH: pleasant and cooperative, no obvious depression or anxiety  BP Readings from Last 3 Encounters:  06/29/18 (!) 146/92  05/20/18 100/74  03/19/18 (!) 142/86  cxray  nad  Poss mild bronchitis   ASSESSMENT AND PLAN:  Discussed the following assessment and plan:  Cough, persistent - Plan: DG Chest 2 View, DG Chest 2 View seems like post infectious vs allergic?    Add  Flonase  ctm at night and short course pred    And stop cough drops  If  persistent or progressive will plan pulmonary consult  ( he will be going  X mas dec cruise to [place])  Cautions bg can go up on prednisone -Patient advised to return or notify health care team  if  new concerns arise.  Patient Instructions  Stop using the cough drops   Uses  Sugar free.  candy .   Many reasons for peristent cough   Add flonase  Nasal  spray each day .  And chlorpheniramine   otc antihistamine  at night. Sheran Fava course of  Prednisone  And if not better  Plan consult with   Pulmonary .  Continue  Acid blocker for reflux .  If not a lot better in 1-2 weeks then plan fu with pulmonary consult.    X ray shows bronchitis . Maybe no surprises .      Cough, Adult Coughing is a reflex that clears your throat and your airways. Coughing helps to heal and protect your lungs. It is normal to cough occasionally, but a cough that happens with other symptoms or lasts a long time may be a sign of a condition that needs treatment. A cough may last only 2-3 weeks (acute), or it may last longer than 8 weeks (chronic). What are the causes? Coughing is commonly caused by:  Breathing in substances that irritate your lungs.  A viral or bacterial respiratory infection.  Allergies.  Asthma.  Postnasal drip.  Smoking.  Acid backing up from the stomach into the esophagus (gastroesophageal reflux).  Certain medicines.  Chronic lung problems, including COPD (or rarely, lung cancer).  Other medical conditions such as heart failure.  Follow these instructions at home: Pay attention to any changes in your symptoms. Take these actions to help with your discomfort:  Take medicines only as told by your health care provider. ? If you were prescribed an antibiotic medicine, take it as told by your health care provider. Do not stop taking the antibiotic even if you start to feel better. ? Talk with your health care provider before you take a cough suppressant medicine.  Drink enough fluid to keep your urine clear or pale yellow.  If the air is dry, use a cold steam vaporizer or humidifier in your bedroom or your home to help loosen secretions.  Avoid anything that causes you to cough at work or at home.  If your cough is worse at night, try sleeping in a semi-upright position.  Avoid cigarette smoke. If you smoke, quit smoking. If you need help quitting, ask your health care provider.  Avoid caffeine.  Avoid alcohol.  Rest as needed.  Contact a health care provider if:  You have new symptoms.  You cough up pus.  Your cough does not get better after 2-3 weeks, or your cough gets worse.  You cannot control your cough with  suppressant medicines and you are losing sleep.  You develop pain that is getting worse or pain that is not controlled with pain medicines.  You have a fever.  You have unexplained weight loss.  You have night sweats. Get help right away if:  You cough up blood.  You have difficulty breathing.  Your heartbeat is very fast. This information is not intended to replace advice given to you by your health care provider. Make sure you discuss any questions you have with your health care provider. Document Released: 01/10/2011 Document Revised: 12/20/2015 Document Reviewed: 09/20/2014 Elsevier Interactive Patient Education  2018 [place]. Panosh M.D.

## 2018-06-29 ENCOUNTER — Ambulatory Visit (INDEPENDENT_AMBULATORY_CARE_PROVIDER_SITE_OTHER): Payer: 59 | Admitting: Internal Medicine

## 2018-06-29 ENCOUNTER — Encounter: Payer: Self-pay | Admitting: Internal Medicine

## 2018-06-29 ENCOUNTER — Ambulatory Visit (INDEPENDENT_AMBULATORY_CARE_PROVIDER_SITE_OTHER): Payer: 59

## 2018-06-29 VITALS — BP 146/92 | HR 102 | Temp 97.5°F | Ht 70.0 in | Wt 275.2 lb

## 2018-06-29 DIAGNOSIS — R05 Cough: Secondary | ICD-10-CM

## 2018-06-29 DIAGNOSIS — R053 Chronic cough: Secondary | ICD-10-CM

## 2018-06-29 MED ORDER — PREDNISONE 20 MG PO TABS: 20.0000 mg | ORAL_TABLET | Freq: Two times a day (BID) | ORAL | 0 refills | Status: DC

## 2018-06-29 NOTE — Patient Instructions (Addendum)
Stop using the cough drops   Uses  Sugar free.  candy .   Many reasons for peristent cough   Add flonase  Nasal  spray each day .  And chlorpheniramine   otc antihistamine  at night. Sheran Fava course of  Prednisone  And if not better  Plan consult with   Pulmonary .  Continue   Acid blocker for reflux .  If not a lot better in 1-2 weeks then plan fu with pulmonary consult.    X ray shows bronchitis . Maybe no surprises .     Cough, Adult Coughing is a reflex that clears your throat and your airways. Coughing helps to heal and protect your lungs. It is normal to cough occasionally, but a cough that happens with other symptoms or lasts a long time may be a sign of a condition that needs treatment. A cough may last only 2-3 weeks (acute), or it may last longer than 8 weeks (chronic). What are the causes? Coughing is commonly caused by:  Breathing in substances that irritate your lungs.  A viral or bacterial respiratory infection.  Allergies.  Asthma.  Postnasal drip.  Smoking.  Acid backing up from the stomach into the esophagus (gastroesophageal reflux).  Certain medicines.  Chronic lung problems, including COPD (or rarely, lung cancer).  Other medical conditions such as heart failure.  Follow these instructions at home: Pay attention to any changes in your symptoms. Take these actions to help with your discomfort:  Take medicines only as told by your health care provider. ? If you were prescribed an antibiotic medicine, take it as told by your health care provider. Do not stop taking the antibiotic even if you start to feel better. ? Talk with your health care provider before you take a cough suppressant medicine.  Drink enough fluid to keep your urine clear or pale yellow.  If the air is dry, use a cold steam vaporizer or humidifier in your bedroom or your home to help loosen secretions.  Avoid anything that causes you to cough at work or at home.  If your cough  is worse at night, try sleeping in a semi-upright position.  Avoid cigarette smoke. If you smoke, quit smoking. If you need help quitting, ask your health care provider.  Avoid caffeine.  Avoid alcohol.  Rest as needed.  Contact a health care provider if:  You have new symptoms.  You cough up pus.  Your cough does not get better after 2-3 weeks, or your cough gets worse.  You cannot control your cough with suppressant medicines and you are losing sleep.  You develop pain that is getting worse or pain that is not controlled with pain medicines.  You have a fever.  You have unexplained weight loss.  You have night sweats. Get help right away if:  You cough up blood.  You have difficulty breathing.  Your heartbeat is very fast. This information is not intended to replace advice given to you by your health care provider. Make sure you discuss any questions you have with your health care provider. Document Released: 01/10/2011 Document Revised: 12/20/2015 Document Reviewed: 09/20/2014 Elsevier Interactive Patient Education  [employer].

## 2018-08-17 ENCOUNTER — Ambulatory Visit (INDEPENDENT_AMBULATORY_CARE_PROVIDER_SITE_OTHER): Payer: 59 | Admitting: Endocrinology

## 2018-08-17 ENCOUNTER — Encounter: Payer: Self-pay | Admitting: Endocrinology

## 2018-08-17 VITALS — BP 116/70 | HR 70 | Ht 70.0 in | Wt 291.4 lb

## 2018-08-17 DIAGNOSIS — E119 Type 2 diabetes mellitus without complications: Secondary | ICD-10-CM

## 2018-08-17 LAB — POCT GLYCOSYLATED HEMOGLOBIN (HGB A1C): HEMOGLOBIN A1C: 7.9 % — AB (ref 4.0–5.6)

## 2018-08-17 MED ORDER — BROMOCRIPTINE MESYLATE 2.5 MG PO TABS: 2.5000 mg | ORAL_TABLET | Freq: Every day | ORAL | 3 refills | Status: DC

## 2018-08-17 NOTE — Progress Notes (Signed)
Subjective:    Patient ID: Alfred Johnson, male    DOB: 07/14/1971, 48 y.o.   MRN: [id number]  HPI Pt returns for f/u of diabetes mellitus: DM type: 2 Dx'ed: 9379 Complications: none Therapy: 5 oral meds DKA: never Severe hypoglycemia: never Pancreatitis: never Other: he has never been on insulin; he declines weight loss surgery; he did not tolerate invokana (itching).   Interval history: He says cbg's are well-controlled.  pt states he feels well in general.   Past Medical History:  Diagnosis Date  . ANEMIA DUE TO CHRONIC BLOOD LOSS 09/10/2009  . Diabetes mellitus   . Elevated BP   . GERD (gastroesophageal reflux disease)   . HYPERLIPIDEMIA, MIXED 03/23/2007  . Hypertension   . KIDNEY STONE 02/05/2009  . VENTRICULAR FUNCTION, DECREASED 06/21/2007    No past surgical history on file.  Social History   Socioeconomic History  . Marital status: Married    Spouse name: Not on file  . Number of children: Not on file  . Years of education: Not on file  . Highest education level: Not on file  Occupational History    Employer: SELF-EMPLOYED    Comment: Does not work outside the home  Social Needs  . Financial resource strain: Not on file  . Food insecurity:    Worry: Not on file    Inability: Not on file  . Transportation needs:    Medical: Not on file    Non-medical: Not on file  Tobacco Use  . Smoking status: Former Research scientist (life sciences)  . Smokeless tobacco: Former Systems developer    Quit date: 07/28/1992  Substance and Sexual Activity  . Alcohol use: No  . Drug use: No  . Sexual activity: Not on file  Lifestyle  . Physical activity:    Days per week: Not on file    Minutes per session: Not on file  . Stress: Not on file  Relationships  . Social connections:    Talks on phone: Not on file    Gets together: Not on file    Attends religious service: Not on file    Active member of club or organization: Not on file    Attends meetings of clubs or organizations: Not on file   Relationship status: Not on file  . Intimate partner violence:    Fear of current or ex partner: Not on file    Emotionally abused: Not on file    Physically abused: Not on file    Forced sexual activity: Not on file  Other Topics Concern  . 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     Current Outpatient Medications on File Prior to Visit  Medication Sig Dispense Refill  . aspirin 81 MG tablet Take 81 mg by mouth daily.      Marland Kitchen atorvastatin (LIPITOR) 40 MG tablet Take 40 mg by mouth daily. Take 1/2 20 mg daily    . cetirizine (ZYRTEC) 10 MG tablet Take 10 mg by mouth daily.    . fluticasone (FLONASE) 50 MCG/ACT nasal spray Place 1 spray into both nostrils daily.    Marland Kitchen glucose blood (ONE TOUCH ULTRA TEST) test strip Check blood sugar 5 to 6 times a day or as directed     .  omeprazole (PRILOSEC) 20 MG capsule Take 10 mg by mouth daily.     . pioglitazone (ACTOS) 45 MG tablet TAKE 1 TABLET (45 MG TOTAL) BY MOUTH DAILY. 90 tablet 1   No current facility-administered medications on file prior to visit.     No Known Allergies  Family History  Problem Relation Age of Onset  . Graves' disease Mother   . Heart attack Father 70       COD  . Heart failure Father   . Kidney disease Father   . Diabetes Brother   . HIV Brother   . Heart attack Maternal Grandfather   . Heart attack Paternal Grandfather   . Heart disease Brother 62       CABG    BP 116/70 (BP Location: Left Arm, Patient Position: Sitting, Cuff Size: Large)   Pulse 70   Ht 5\' 10"  (1.778 m)   Wt 291 lb 6.4 oz (132.2 kg)   SpO2 92%   BMI 41.81 kg/m    Review of Systems He denies hypoglycemia.      Objective:   Physical Exam VITAL SIGNS:  See vs page GENERAL: no distress Pulses: dorsalis pedis intact bilat.   MSK: no deformity of the feet CV: no leg  edema Skin:  no ulcer on the feet.  normal color and temp on the feet. Neuro: sensation is intact to touch on the feet  Lab Results  Component Value Date   HGBA1C 7.9 (A) 08/17/2018       Assessment & Plan:  Type 2 DM: worse  Patient Instructions  check your blood sugar once a day.  vary the time of day when you check, between before the 3 meals, and at bedtime.  also check if you have symptoms of your blood sugar being too high or too low.  please keep a record of the readings and bring it to your next appointment here.  You can write it on any piece of paper.  please call us sooner if your blood sugar goes below 70, or if you have a lot of readings over 200.  I have sent a prescription to your pharmacy, to increase the bromocriptine to 1 pill per day. Please continue the same other medications for diabetes.   Please come back for a follow-up appointment in 6 months.

## 2018-08-17 NOTE — Patient Instructions (Addendum)
check your blood sugar once a day.  vary the time of day when you check, between before the 3 meals, and at bedtime.  also check if you have symptoms of your blood sugar being too high or too low.  please keep a record of the readings and bring it to your next appointment here.  You can write it on any piece of paper.  please call us sooner if your blood sugar goes below 70, or if you have a lot of readings over 200.  I have sent a prescription to your pharmacy, to increase the bromocriptine to 1 pill per day. Please continue the same other medications for diabetes.   Please come back for a follow-up appointment in 6 months.

## 2018-08-18 ENCOUNTER — Telehealth: Payer: Self-pay

## 2018-08-18 DIAGNOSIS — E119 Type 2 diabetes mellitus without complications: Secondary | ICD-10-CM

## 2018-08-18 MED ORDER — EMPAGLIFLOZIN 25 MG PO TABS: 25.0000 mg | ORAL_TABLET | Freq: Every day | ORAL | 11 refills | Status: DC

## 2018-08-18 MED ORDER — SITAGLIP PHOS-METFORMIN HCL ER 50-1000 MG PO TB24: 2.0000 | ORAL_TABLET | Freq: Every day | ORAL | 11 refills | Status: DC

## 2018-08-18 NOTE — Telephone Encounter (Signed)
PA initiated today through [place] for Janumet XR 50-1000 and Jardiance 25mg . Spoke with [place]. States Janumet XR 50-1000 is a covered medication requiring no PA. Approved for 90 day supply. Further states Jardiance 25mg  has been approved effective today (08/18/18) and for the next 36 months pending no changes to the patient's current benefit plan/coverage. [place] #[id number]. Advised pt is permitted to continue use of current pharmacy of choice ([facility], [address] Advised pharmacist may submit medication.  Called pt and made him aware of approval. Verbalized acceptance and understanding.

## 2018-09-28 ENCOUNTER — Other Ambulatory Visit: Payer: Self-pay | Admitting: Endocrinology

## 2018-10-21 ENCOUNTER — Other Ambulatory Visit: Payer: Self-pay | Admitting: Endocrinology

## 2018-12-07 ENCOUNTER — Other Ambulatory Visit: Payer: Self-pay

## 2018-12-07 ENCOUNTER — Ambulatory Visit (INDEPENDENT_AMBULATORY_CARE_PROVIDER_SITE_OTHER): Payer: 59 | Admitting: Orthopaedic Surgery

## 2018-12-07 ENCOUNTER — Ambulatory Visit (INDEPENDENT_AMBULATORY_CARE_PROVIDER_SITE_OTHER): Payer: 59

## 2018-12-07 DIAGNOSIS — M25511 Pain in right shoulder: Secondary | ICD-10-CM

## 2018-12-07 MED ORDER — MELOXICAM 7.5 MG PO TABS: 15.0000 mg | ORAL_TABLET | Freq: Every day | ORAL | 2 refills | Status: DC | PRN

## 2018-12-07 NOTE — Progress Notes (Signed)
Office Visit Note   Patient: Alfred Johnson           Date of Birth: June 09, 1971           MRN: [id number] Visit Date: 12/07/2018              Requested by: Burnis Medin, MD [place], [place] [address] PCP: Burnis Medin, MD   Assessment & Plan: Visit Diagnoses:  1. Acute pain of right shoulder     Plan: Impression is right supraspinatus sprain versus small partial tear with preserved function and strength.  I feel that this has a high likelihood of resolving with conservative treatment in the form of home exercises, relative rest, NSAIDs.  I did offer cortisone injection but patient politely declined.  I have asked him to contact us if his shoulder does not improve in the next 4 to 6 weeks so that we may obtain an MRI of the right shoulder to rule out rotator cuff pathology.  Questions encouraged and answered.  Follow-Up Instructions: Return if symptoms worsen or fail to improve.   Orders:  Orders Placed This Encounter  Procedures  . XR Shoulder Right   No orders of the defined types were placed in this encounter.     Procedures: No procedures performed   Clinical Data: No additional findings.   Subjective: Chief Complaint  Patient presents with  . Right Shoulder - Pain    Alfred Johnson is a pleasant 48 year old gentleman comes in for acute right shoulder pain that began 1 week ago when he was on a hike and in order to slow himself down while going downhill he reached out and grabbed a tree.  He did not fall but he did feel a slight tweak in his shoulder at that time and since then the pain has been off and on with periods of intense pain and inability to raise his arm above for most of the time the pain is quite manageable and his function is normal.  He denies any radicular symptoms.  He has been using ice and heat and a TENS unit which actually makes it worse.  Denies any numbness and tingling or neck pain.   Review of Systems  Constitutional:  Negative.   All other systems reviewed and are negative.    Objective: Vital Signs: There were no vitals taken for this visit.  Physical Exam Vitals signs and nursing note reviewed.  Constitutional:      Appearance: He is well-developed.  Pulmonary:     Effort: Pulmonary effort is normal.  Abdominal:     Palpations: Abdomen is soft.  Skin:    General: Skin is warm.  Neurological:     Mental Status: He is alert and oriented to person, place, and time.  Psychiatric:        Behavior: Behavior normal.        Thought Content: Thought content normal.        Judgment: Judgment normal.     Ortho Exam Right shoulder exam shows normal active and passive range of motion without significant pain.  He has pain with empty can testing but strength is normal.  Rest of the rotator cuff is normal to manual muscle testing.  Negative O'Brien.  No significant pain with crank test.  Negative jerk test.  Negative Speed.  Negative impingement.  Negative cross body adduction.  Specialty Comments:  No specialty comments available.  Imaging: Xr Shoulder Right  Result Date: 12/07/2018 No  acute or structural abnormalities    PMFS History: Patient Active Problem List   Diagnosis Date Noted  . S/P right knee arthroscopy 04/15/2018  . Acute medial meniscus tear of right knee 01/05/2018  . Lung nodule 02/11/2016  . Diabetes mellitus without complication ([place]) 78/24/2353  . Left lower lobe pneumonia ([place]) 02/08/2014  . BMI 35.0-35.9,adult 06/01/2012  . Visit for preventive health examination 06/01/2012  . Cough 07/09/2010  . ANGER 09/10/2009  . WEIGHT GAIN 09/10/2009  . KIDNEY STONE 02/05/2009  . Diabetes ([place]) 03/23/2007  . HYPERLIPIDEMIA, MIXED 03/23/2007  . OBESITY NOS 02/25/2007  . Essential hypertension 02/25/2007  . GERD 02/11/2007   Past Medical History:  Diagnosis Date  . ANEMIA DUE TO CHRONIC BLOOD LOSS 09/10/2009  . Diabetes mellitus   . Elevated BP   . GERD (gastroesophageal  reflux disease)   . HYPERLIPIDEMIA, MIXED 03/23/2007  . Hypertension   . KIDNEY STONE 02/05/2009  . VENTRICULAR FUNCTION, DECREASED 06/21/2007    Family History  Problem Relation Age of Onset  . Graves' disease Mother   . Heart attack Father 31       COD  . Heart failure Father   . Kidney disease Father   . Diabetes Brother   . HIV Brother   . Heart attack Maternal Grandfather   . Heart attack Paternal Grandfather   . Heart disease Brother 25       CABG    No past surgical history on file. Social History   Occupational History    Employer: SELF-EMPLOYED    Comment: Does not work outside the home  Tobacco Use  . Smoking status: Former Research scientist (life sciences)  . Smokeless tobacco: Former Systems developer    Quit date: 07/28/1992  Substance and Sexual Activity  . Alcohol use: No  . Drug use: No  . Sexual activity: Not on file

## 2019-01-15 ENCOUNTER — Other Ambulatory Visit: Payer: Self-pay | Admitting: Endocrinology

## 2019-01-22 ENCOUNTER — Other Ambulatory Visit: Payer: Self-pay | Admitting: Orthopaedic Surgery

## 2019-02-04 ENCOUNTER — Other Ambulatory Visit: Payer: Self-pay

## 2019-02-08 ENCOUNTER — Telehealth: Payer: Self-pay

## 2019-02-08 ENCOUNTER — Ambulatory Visit (INDEPENDENT_AMBULATORY_CARE_PROVIDER_SITE_OTHER): Payer: 59 | Admitting: Endocrinology

## 2019-02-08 ENCOUNTER — Encounter: Payer: Self-pay | Admitting: Endocrinology

## 2019-02-08 ENCOUNTER — Other Ambulatory Visit: Payer: Self-pay

## 2019-02-08 VITALS — BP 120/80 | HR 87 | Ht 70.0 in | Wt 287.6 lb

## 2019-02-08 DIAGNOSIS — E119 Type 2 diabetes mellitus without complications: Secondary | ICD-10-CM

## 2019-02-08 LAB — POCT GLYCOSYLATED HEMOGLOBIN (HGB A1C): Hemoglobin A1C: 8 % — AB (ref 4.0–5.6)

## 2019-02-08 MED ORDER — OZEMPIC (0.25 OR 0.5 MG/DOSE) 2 MG/1.5ML ~~LOC~~ SOPN: 0.2500 mg | PEN_INJECTOR | SUBCUTANEOUS | 3 refills | Status: DC

## 2019-02-08 MED ORDER — BROMOCRIPTINE MESYLATE 2.5 MG PO TABS: 2.5000 mg | ORAL_TABLET | Freq: Every day | ORAL | 3 refills | Status: DC

## 2019-02-08 NOTE — Telephone Encounter (Signed)
[employer] and spoke with Eddie Dibbles and [place]. PA completed for Ozempic. WHQ#[id number] PA for Ozempic has been approved 02/08/19 through 02/08/20.

## 2019-02-08 NOTE — Progress Notes (Signed)
Subjective:    Patient ID: Alfred Johnson, male    DOB: 07/30/1970, 48 y.o.   MRN: [id number]  HPI Pt returns for f/u of diabetes mellitus: DM type: 2 Dx'ed: 8325 Complications: none Therapy: 5 oral meds DKA: never Severe hypoglycemia: never Pancreatitis: never Other: he has never been on insulin; he declines weight loss surgery; he did not tolerate invokana (itching).   Interval history: He says cbg's are in the low to mid-100's.  pt states he feels well in general.  He takes meds as rx'ed.   Past Medical History:  Diagnosis Date  . ANEMIA DUE TO CHRONIC BLOOD LOSS 09/10/2009  . Diabetes mellitus   . Elevated BP   . GERD (gastroesophageal reflux disease)   . HYPERLIPIDEMIA, MIXED 03/23/2007  . Hypertension   . KIDNEY STONE 02/05/2009  . VENTRICULAR FUNCTION, DECREASED 06/21/2007    No past surgical history on file.  Social History   Socioeconomic History  . Marital status: Married    Spouse name: Not on file  . Number of children: Not on file  . Years of education: Not on file  . Highest education level: Not on file  Occupational History    Employer: SELF-EMPLOYED    Comment: Does not work outside the home  Social Needs  . Financial resource strain: Not on file  . Food insecurity    Worry: Not on file    Inability: Not on file  . Transportation needs    Medical: Not on file    Non-medical: Not on file  Tobacco Use  . Smoking status: Former Research scientist (life sciences)  . Smokeless tobacco: Former Systems developer    Quit date: 07/28/1992  Substance and Sexual Activity  . Alcohol use: No  . Drug use: No  . Sexual activity: Not on file  Lifestyle  . Physical activity    Days per week: Not on file    Minutes per session: Not on file  . Stress: Not on file  Relationships  . Social Herbalist on phone: Not on file    Gets together: Not on file    Attends religious service: Not on file    Active member of club or organization: Not on file    Attends meetings of clubs or  organizations: Not on file    Relationship status: Not on file  . Intimate partner violence    Fear of current or ex partner: Not on file    Emotionally abused: Not on file    Physically abused: Not on file    Forced sexual activity: Not on file  Other Topics Concern  . 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     Current Outpatient Medications on File Prior to Visit  Medication Sig Dispense Refill  . aspirin 81 MG tablet Take 81 mg by mouth daily.      Marland Kitchen atorvastatin (LIPITOR) 40 MG tablet Take 40 mg by mouth daily. Take 1/2 20 mg daily    . cetirizine (ZYRTEC) 10 MG tablet Take 10 mg by mouth daily.    . empagliflozin (JARDIANCE) 25 MG TABS tablet Take 25 mg by mouth daily. 30 tablet 11  . fluticasone (FLONASE) 50 MCG/ACT nasal spray Place 1 spray into  both nostrils daily.    Marland Kitchen glucose blood (ONE TOUCH ULTRA TEST) test strip Check blood sugar 5 to 6 times a day or as directed     . meloxicam (MOBIC) 7.5 MG tablet TAKE 2 TABLETS (15 MG TOTAL) BY MOUTH DAILY AS NEEDED FOR PAIN. 30 tablet 2  . omeprazole (PRILOSEC) 20 MG capsule Take 10 mg by mouth daily.     . pioglitazone (ACTOS) 45 MG tablet Take 1 tablet by mouth once daily 90 tablet 0  . SitaGLIPtin-MetFORMIN HCl (JANUMET XR) 50-1000 MG TB24 Take 2 tablets by mouth daily. 60 tablet 11   No current facility-administered medications on file prior to visit.     No Known Allergies  Family History  Problem Relation Age of Onset  . Graves' disease Mother   . Heart attack Father 80       COD  . Heart failure Father   . Kidney disease Father   . Diabetes Brother   . HIV Brother   . Heart attack Maternal Grandfather   . Heart attack Paternal Grandfather   . Heart disease Brother 39       CABG    BP 120/80 (BP Location: Left Arm, Patient Position: Sitting, Cuff  Size: Large)   Pulse 87   Ht 5\' 10"  (1.778 m)   Wt 287 lb 9.6 oz (130.5 kg)   SpO2 96%   BMI 41.27 kg/m    Review of Systems He denies hypoglycemia    Objective:   Physical Exam VITAL SIGNS:  See vs page GENERAL: no distress Pulses: dorsalis pedis intact bilat.   MSK: no deformity of the feet CV: no leg edema. Skin:  no ulcer on the feet.  normal color and temp on the feet.  Neuro: sensation is intact to touch on the feet.   Lab Results  Component Value Date   HGBA1C 8.0 (A) 02/08/2019       Assessment & Plan:  Type 2 DM: worse.  Patient Instructions  check your blood sugar once a day.  vary the time of day when you check, between before the 3 meals, and at bedtime.  also check if you have symptoms of your blood sugar being too high or too low.  please keep a record of the readings and bring it to your next appointment here.  You can write it on any piece of paper.  please call us sooner if your blood sugar goes below 70, or if you have a lot of readings over 200.  I have sent a prescription to your pharmacy, to increase the bromocriptine to 1 pill per day, and to add "Ozempic." Please let me know if you need to see [name], to learn how to use the pen.   Please continue the same other medications for diabetes.   Please come back for a follow-up appointment in 2-3 months.

## 2019-02-08 NOTE — Patient Instructions (Addendum)
check your blood sugar once a day.  vary the time of day when you check, between before the 3 meals, and at bedtime.  also check if you have symptoms of your blood sugar being too high or too low.  please keep a record of the readings and bring it to your next appointment here.  You can write it on any piece of paper.  please call us sooner if your blood sugar goes below 70, or if you have a lot of readings over 200.  I have sent a prescription to your pharmacy, to increase the bromocriptine to 1 pill per day, and to add "Ozempic." Please let me know if you need to see [name], to learn how to use the pen.   Please continue the same other medications for diabetes.   Please come back for a follow-up appointment in 2-3 months.

## 2019-02-08 NOTE — Telephone Encounter (Signed)
PA initiated today through Cover My Meds for Ozempic. Will await insurance response re: approval/denial.  Alfred Johnson (Key: [id number]) Rx #: [id number] Ozempic (0.25 or 0.5 MG/DOSE) 2MG /1.5ML pen-injectors   Form Secondary school teacher PA Form Created 3 hours ago Sent to Plan 1 minute ago Plan Response 1 minute ago Submit Clinical Questions Determination N/A Message from Plan Unable to process your request electronically. Please contact the number on the back of the Member's card for further assistance  PA will remain on hold until able to call insurance company to complete PA.

## 2019-03-19 IMAGING — XA DG FLUORO GUIDE NDL PLC/BX
5 series · 5 of 5 positions shown · non-contrast
Comparison: none

CLINICAL DATA: RIGHT knee pain.

[Series 1: ortho standard · 1 of 1 slices shown (1 of 5)]
[im 1/1]
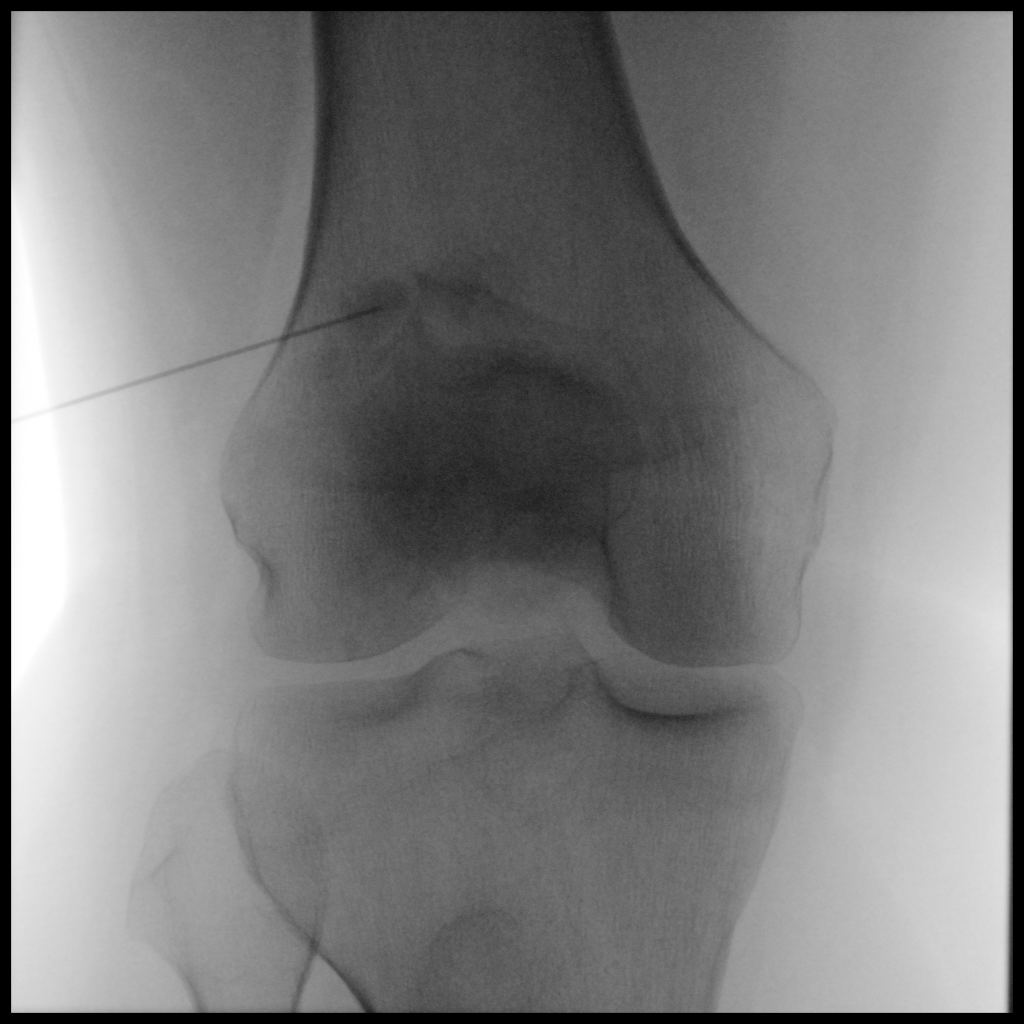

[Series 2: ortho standard · 1 of 1 slices shown (2 of 5)]
[im 1/1]
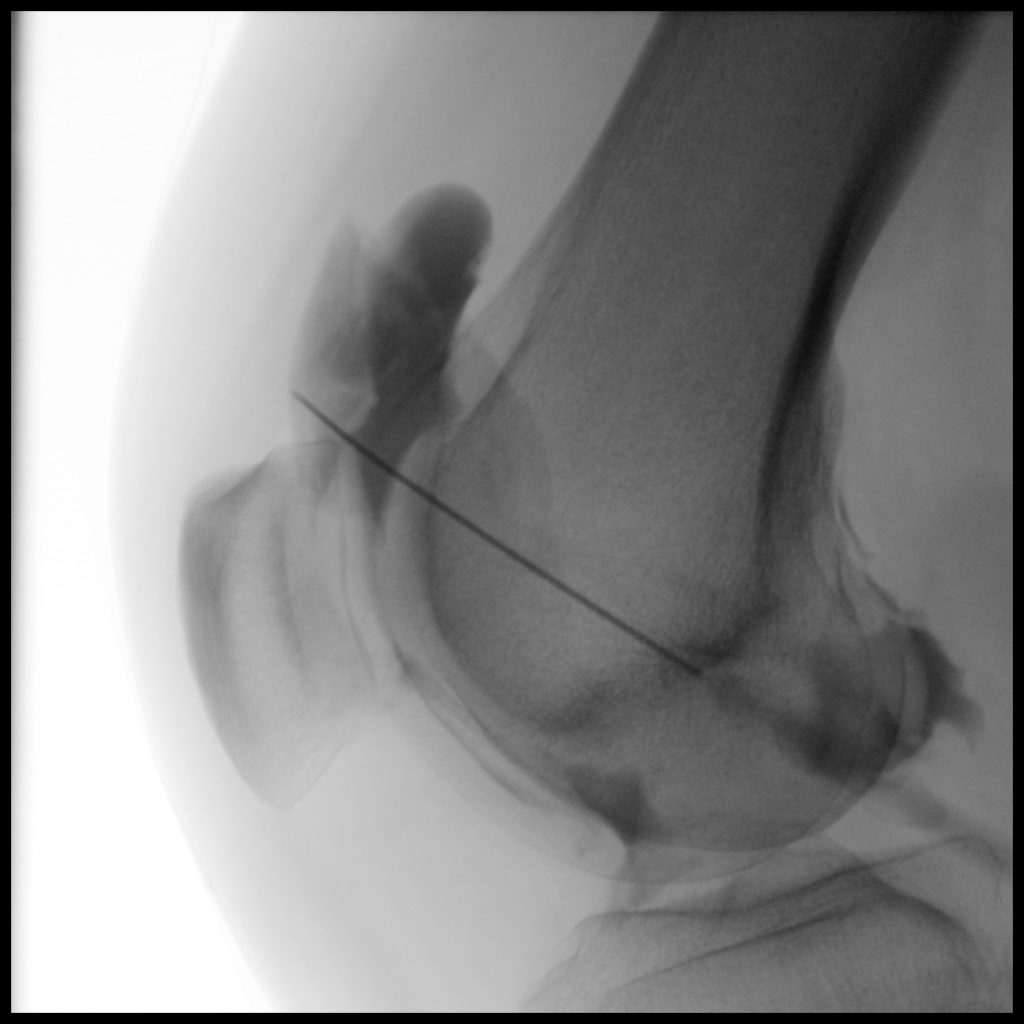

[Series 3: ortho standard · 1 of 1 slices shown (3 of 5)]
[im 1/1]
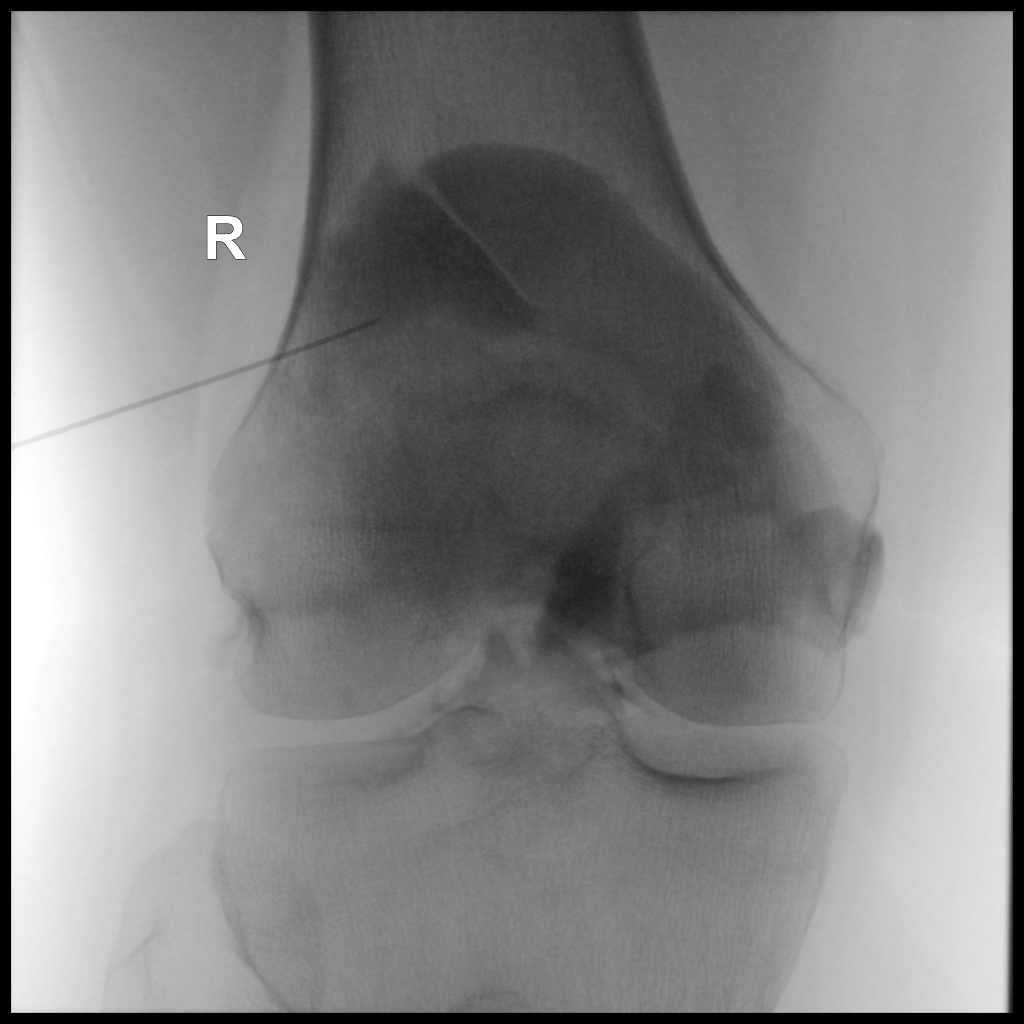

[Series 4: ortho standard · 1 of 1 slices shown (4 of 5)]
[im 1/1]
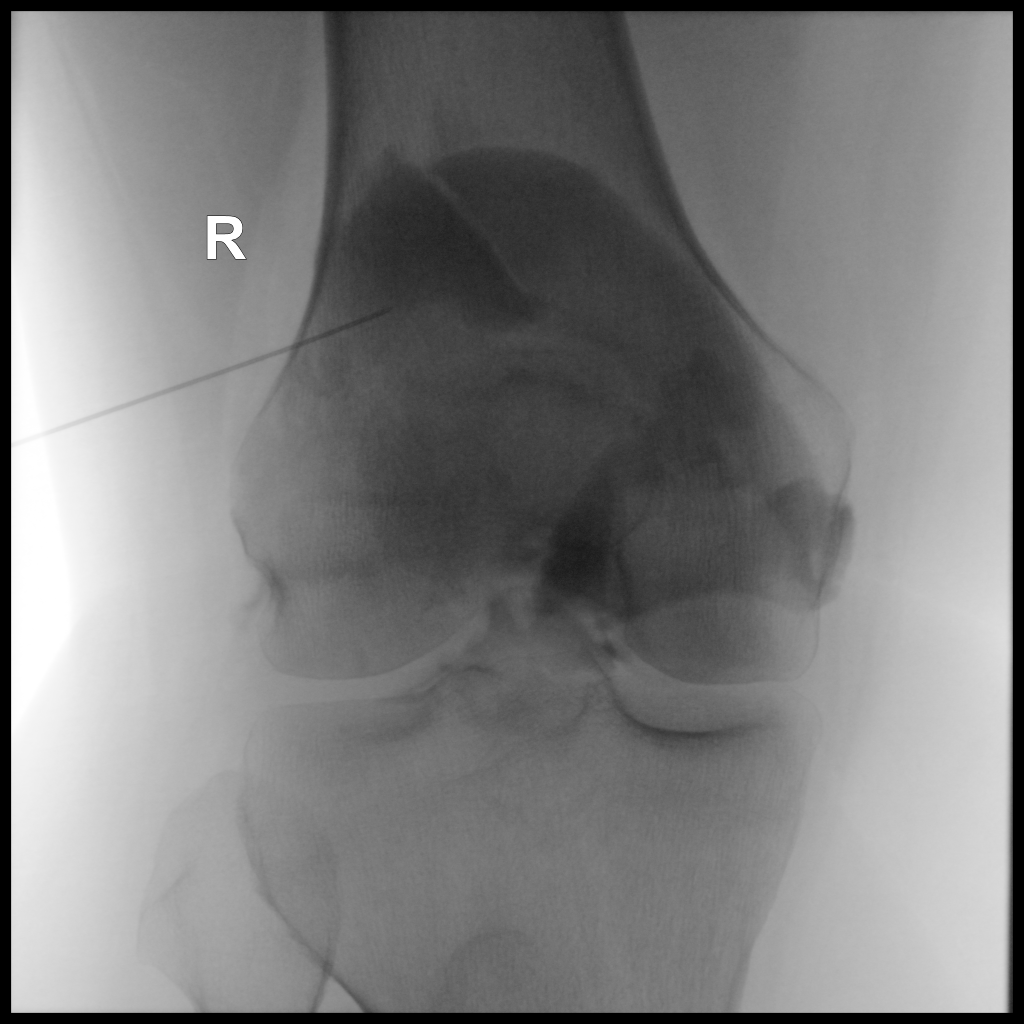

[Series 5: ortho standard · 1 of 1 slices shown (5 of 5)]
[im 1/1]
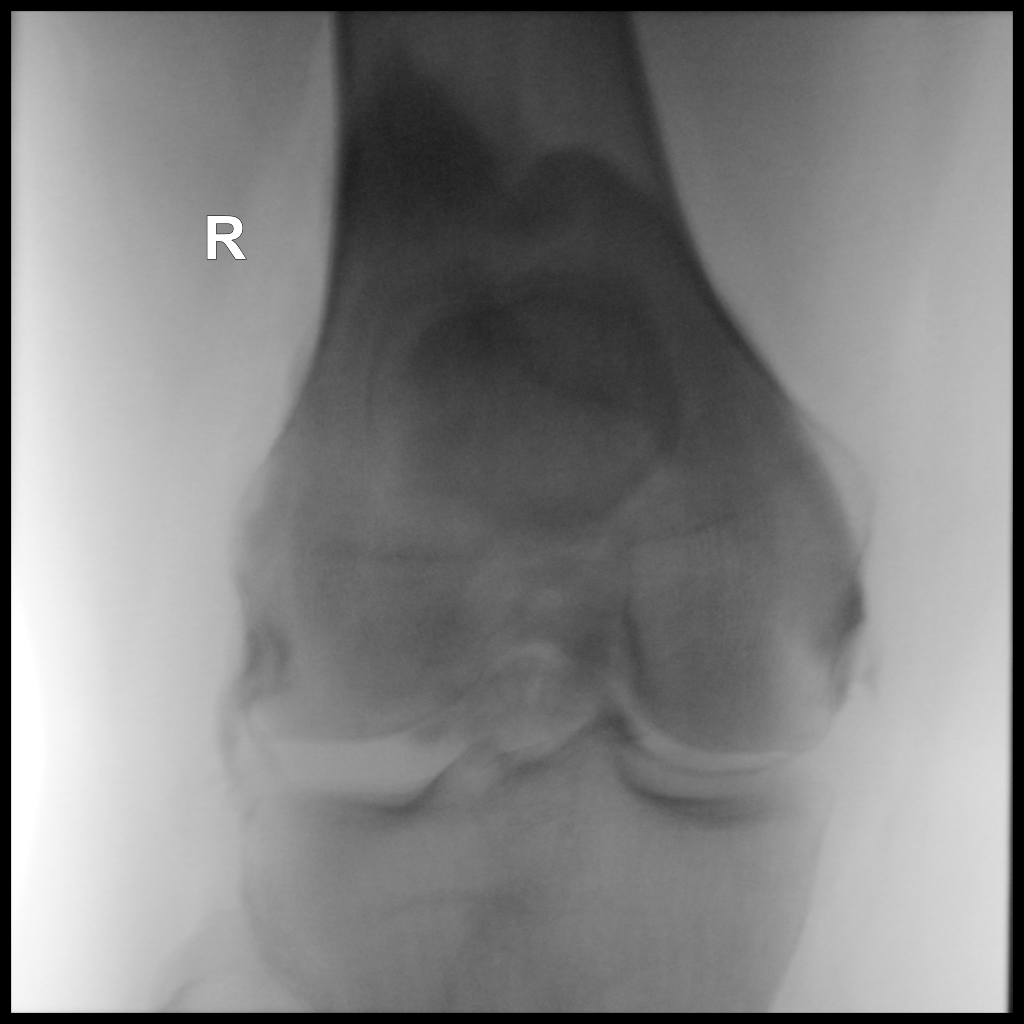

[5 of 5 positions shown; findings below may reference images not displayed]

FLUOROSCOPY TIME:  33 seconds corresponding to a Dose Area Product
of 38.15 Gy*m2

PROCEDURE:
RIGHT KNEE INJECTION UNDER FLUOROSCOPY

Informed written consent was obtained.  Time-out was performed.

An appropriate skin entrance site was determined. The site was
marked, prepped with Betadine, draped in the usual sterile fashion,
and infiltrated locally with 1% lidocaine. 22 gauge spinal needle
was advanced to the suprapatellar bursa under fluoroscopic guidance.
1 ml of Lidocaine injected easily. A mixture of 0.2 ml Multihance
and 40 mL of dilute Isovue M 200 was then used to opacify the knee
joint. The knee was wrapped, the patient was taken to MRI. No
immediate complication.
IMPRESSION: Technically successful RIGHT knee injection for MRI.

## 2019-04-11 ENCOUNTER — Telehealth: Payer: Self-pay

## 2019-04-11 ENCOUNTER — Encounter: Payer: Self-pay | Admitting: Endocrinology

## 2019-04-11 NOTE — Telephone Encounter (Signed)
Following message received from pt via My Chart:  Insurance is requiring Prior Authorization for [employer] (Previous PA expired). Pharmacy sent the request last week to doctors office asking to get PA. Would you be able to follow up on getting PA? Sorry for the inconvenience.  Thank you for your help. Alfred Johnson  To date, no request has ever been received from either pt insurance NOR pharmacy requesting a PA for Janumet. In addition, Janumet PA was completed on 08/18/18. Following approval was received: Auth [id number], effective 08/18/18 for the next 36 months. Uncertain why an additional request is being made or why this has expired, PA initiated today through Cover My Meds for Janumet. Will await insurance response re: approval/denial.  Alfred Johnson (KeyOI:[id number])  Your information has been submitted to [employer]. To check for an updated outcome later, reopen this PA request from your dashboard.  If [employer] has not responded to your request within 24 hours, contact [employer] at [phone]. If you think there may be a problem with your PA request, use our live chat feature at the bottom right.

## 2019-04-12 NOTE — Telephone Encounter (Signed)
Received notification from [place] that [place] for [place] has been approved 04/12/19 through 04/11/20. Documents have been labeled and placed in scan file for HIM and for our future reference.

## 2019-04-17 ENCOUNTER — Other Ambulatory Visit: Payer: Self-pay | Admitting: Endocrinology

## 2019-05-13 LAB — HM DIABETES EYE EXAM

## 2019-05-16 ENCOUNTER — Other Ambulatory Visit: Payer: Self-pay

## 2019-05-18 ENCOUNTER — Other Ambulatory Visit: Payer: Self-pay

## 2019-05-18 ENCOUNTER — Ambulatory Visit (INDEPENDENT_AMBULATORY_CARE_PROVIDER_SITE_OTHER): Payer: 59 | Admitting: Endocrinology

## 2019-05-18 ENCOUNTER — Encounter: Payer: Self-pay | Admitting: Endocrinology

## 2019-05-18 VITALS — BP 120/60 | HR 101 | Ht 70.0 in | Wt 284.6 lb

## 2019-05-18 DIAGNOSIS — R609 Edema, unspecified: Secondary | ICD-10-CM

## 2019-05-18 DIAGNOSIS — E119 Type 2 diabetes mellitus without complications: Secondary | ICD-10-CM

## 2019-05-18 DIAGNOSIS — R000 Tachycardia, unspecified: Secondary | ICD-10-CM

## 2019-05-18 LAB — POCT GLYCOSYLATED HEMOGLOBIN (HGB A1C): Hemoglobin A1C: 7.3 % — AB (ref 4.0–5.6)

## 2019-05-18 MED ORDER — OZEMPIC (0.25 OR 0.5 MG/DOSE) 2 MG/1.5ML ~~LOC~~ SOPN: 0.5000 mg | PEN_INJECTOR | SUBCUTANEOUS | 3 refills | Status: DC

## 2019-05-18 NOTE — Patient Instructions (Addendum)
I have sent a prescription to your pharmacy, to increase the Ozempic. Please continue the same other diabetes medications. Please have the thyroid blood test checked soon,as your heart rate is a little fast today.  check your blood sugar once a day.  vary the time of day when you check, between before the 3 meals, and at bedtime.  also check if you have symptoms of your blood sugar being too high or too low.  please keep a record of the readings and bring it to your next appointment here (or you can bring the meter itself).  You can write it on any piece of paper.  please call us sooner if your blood sugar goes below 70, or if you have a lot of readings over 200. Please come back for a follow-up appointment in 3-4 months.

## 2019-05-18 NOTE — Progress Notes (Signed)
Subjective:    Patient ID: Alfred Johnson, male    DOB: February 25, 1971, 48 y.o.   MRN: [id number]  HPI Pt returns for f/u of diabetes mellitus: DM type: 2 Dx'ed: [id number] Complications: none Therapy: Ozempic and 5 oral meds DKA: never Severe hypoglycemia: never Pancreatitis: never Other: he has never been on insulin; he declines weight loss surgery; he did not tolerate invokana (itching).   Interval history: He says cbg's are well-controlled.  pt states he feels well in general.  He takes meds as rx'ed. Past Medical History:  Diagnosis Date  . ANEMIA DUE TO CHRONIC BLOOD LOSS 09/10/2009  . Diabetes mellitus   . Elevated BP   . GERD (gastroesophageal reflux disease)   . HYPERLIPIDEMIA, MIXED 03/23/2007  . Hypertension   . KIDNEY STONE 02/05/2009  . VENTRICULAR FUNCTION, DECREASED 06/21/2007    No past surgical history on file.  Social History   Socioeconomic History  . Marital status: Married    Spouse name: Not on file  . Number of children: Not on file  . Years of education: Not on file  . Highest education level: Not on file  Occupational History    Employer: SELF-EMPLOYED    Comment: Does not work outside the home  Social Needs  . Financial resource strain: Not on file  . Food insecurity    Worry: Not on file    Inability: Not on file  . Transportation needs    Medical: Not on file    Non-medical: Not on file  Tobacco Use  . Smoking status: Former Research scientist (life sciences)  . Smokeless tobacco: Former Systems developer    Quit date: 07/28/1992  Substance and Sexual Activity  . Alcohol use: No  . Drug use: No  . Sexual activity: Not on file  Lifestyle  . Physical activity    Days per week: Not on file    Minutes per session: Not on file  . Stress: Not on file  Relationships  . Social Herbalist on phone: Not on file    Gets together: Not on file    Attends religious service: Not on file    Active member of club or organization: Not on file    Attends meetings of clubs or  organizations: Not on file    Relationship status: Not on file  . Intimate partner violence    Fear of current or ex partner: Not on file    Emotionally abused: Not on file    Physically abused: Not on file    Forced sexual activity: Not on file  Other Topics Concern  . 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     Current Outpatient Medications on File Prior to Visit  Medication Sig Dispense Refill  . aspirin 81 MG tablet Take 81 mg by mouth daily.      Marland Kitchen atorvastatin (LIPITOR) 40 MG tablet Take 40 mg by mouth daily. Take 1/2 20 mg daily    . bromocriptine (PARLODEL) 2.5 MG tablet Take 1 tablet (2.5 mg total) by mouth daily. 90 tablet 3  . cetirizine (ZYRTEC) 10 MG tablet Take 10 mg by mouth daily.    . empagliflozin (JARDIANCE) 25 MG TABS tablet Take 25 mg by mouth daily.  30 tablet 11  . fluticasone (FLONASE) 50 MCG/ACT nasal spray Place 1 spray into both nostrils daily.    Marland Kitchen glucose blood (ONE TOUCH ULTRA TEST) test strip Check blood sugar 5 to 6 times a day or as directed     . meloxicam (MOBIC) 7.5 MG tablet TAKE 2 TABLETS (15 MG TOTAL) BY MOUTH DAILY AS NEEDED FOR PAIN. 30 tablet 2  . omeprazole (PRILOSEC) 20 MG capsule Take 10 mg by mouth daily.     . pioglitazone (ACTOS) 45 MG tablet Take 1 tablet by mouth once daily 90 tablet 0  . SitaGLIPtin-MetFORMIN HCl (JANUMET XR) 50-1000 MG TB24 Take 2 tablets by mouth daily. 60 tablet 11   No current facility-administered medications on file prior to visit.     No Known Allergies  Family History  Problem Relation Age of Onset  . Graves' disease Mother   . Heart attack Father 30       COD  . Heart failure Father   . Kidney disease Father   . Diabetes Brother   . HIV Brother   . Heart attack Maternal Grandfather   . Heart attack Paternal Grandfather   . Heart  disease Brother 19       CABG    BP 120/60 (BP Location: Left Arm, Patient Position: Sitting, Cuff Size: Large)   Pulse (!) 101   Ht 5\' 10"  (1.778 m)   Wt 284 lb 9.6 oz (129.1 kg)   SpO2 97%   BMI 40.84 kg/m    Review of Systems nausea is resolved.      Objective:   Physical Exam VITAL SIGNS:  See vs page GENERAL: no distress Pulses: dorsalis pedis intact bilat.   MSK: no deformity of the feet CV: trace bilat leg edema Skin:  no ulcer on the feet.  normal color and temp on the feet. Neuro: sensation is intact to touch on the feet   Lab Results  Component Value Date   HGBA1C 7.3 (A) 05/18/2019      Assessment & Plan:  Type 2 DM: she needs increased rx Tachycardia, new.  Edema: mild.  This limits rx options   Patient Instructions  I have sent a prescription to your pharmacy, to increase the Ozempic. Please continue the same other diabetes medications. Please have the thyroid blood test checked soon,as your heart rate is a little fast today.  check your blood sugar once a day.  vary the time of day when you check, between before the 3 meals, and at bedtime.  also check if you have symptoms of your blood sugar being too high or too low.  please keep a record of the readings and bring it to your next appointment here (or you can bring the meter itself).  You can write it on any piece of paper.  please call us sooner if your blood sugar goes below 70, or if you have a lot of readings over 200. Please come back for a follow-up appointment in 3-4 months.

## 2019-06-15 ENCOUNTER — Other Ambulatory Visit: Payer: Self-pay

## 2019-06-15 DIAGNOSIS — Z20822 Contact with and (suspected) exposure to covid-19: Secondary | ICD-10-CM

## 2019-06-17 ENCOUNTER — Encounter: Payer: 59 | Admitting: Internal Medicine

## 2019-06-17 LAB — NOVEL CORONAVIRUS, NAA: SARS-CoV-2, NAA: NOT DETECTED

## 2019-07-08 ENCOUNTER — Other Ambulatory Visit: Payer: Self-pay | Admitting: Endocrinology

## 2019-08-19 ENCOUNTER — Other Ambulatory Visit: Payer: Self-pay

## 2019-08-19 ENCOUNTER — Ambulatory Visit (INDEPENDENT_AMBULATORY_CARE_PROVIDER_SITE_OTHER): Payer: 59 | Admitting: Internal Medicine

## 2019-08-19 ENCOUNTER — Encounter: Payer: Self-pay | Admitting: Internal Medicine

## 2019-08-19 VITALS — BP 120/60 | HR 98 | Temp 97.6°F | Ht 70.0 in | Wt 289.2 lb

## 2019-08-19 DIAGNOSIS — Z0000 Encounter for general adult medical examination without abnormal findings: Secondary | ICD-10-CM | POA: Diagnosis not present

## 2019-08-19 DIAGNOSIS — Z23 Encounter for immunization: Secondary | ICD-10-CM | POA: Diagnosis not present

## 2019-08-19 DIAGNOSIS — E119 Type 2 diabetes mellitus without complications: Secondary | ICD-10-CM | POA: Diagnosis not present

## 2019-08-19 DIAGNOSIS — Z1211 Encounter for screening for malignant neoplasm of colon: Secondary | ICD-10-CM

## 2019-08-19 DIAGNOSIS — Z125 Encounter for screening for malignant neoplasm of prostate: Secondary | ICD-10-CM

## 2019-08-19 DIAGNOSIS — Z6841 Body Mass Index (BMI) 40.0 and over, adult: Secondary | ICD-10-CM

## 2019-08-19 LAB — BASIC METABOLIC PANEL
BUN: 22 mg/dL (ref 6–23)
CO2: 23 mEq/L (ref 19–32)
Calcium: 10.2 mg/dL (ref 8.4–10.5)
Chloride: 103 mEq/L (ref 96–112)
Creatinine, Ser: 0.95 mg/dL (ref 0.40–1.50)
GFR: 83.79 mL/min (ref >60.00)
Glucose, Bld: 192 mg/dL — ABNORMAL HIGH (ref 70–99)
Potassium: 4.7 mEq/L (ref 3.5–5.1)
Sodium: 141 mEq/L (ref 135–145)

## 2019-08-19 LAB — CBC WITH DIFFERENTIAL/PLATELET
Basophils Absolute: 0.1 10*3/uL (ref 0.0–0.1)
Basophils Relative: 0.8 % (ref 0.0–3.0)
Eosinophils Absolute: 0.4 10*3/uL (ref 0.0–0.7)
Eosinophils Relative: 4.5 % (ref 0.0–5.0)
HCT: 49.8 % (ref 39.0–52.0)
Hemoglobin: 16.4 g/dL (ref 13.0–17.0)
Lymphocytes Relative: 24.7 % (ref 12.0–46.0)
Lymphs Abs: 2 10*3/uL (ref 0.7–4.0)
MCHC: 32.9 g/dL (ref 30.0–36.0)
MCV: 87.9 fl (ref 78.0–100.0)
Monocytes Absolute: 0.8 10*3/uL (ref 0.1–1.0)
Monocytes Relative: 9.7 % (ref 3.0–12.0)
Neutro Abs: 5 10*3/uL (ref 1.4–7.7)
Neutrophils Relative %: 60.3 % (ref 43.0–77.0)
Platelets: 271 10*3/uL (ref 150.0–400.0)
RBC: 5.66 Mil/uL (ref 4.22–5.81)
RDW: 15.1 % (ref 11.5–15.5)
WBC: 8.2 10*3/uL (ref 4.0–10.5)

## 2019-08-19 LAB — HEPATIC FUNCTION PANEL
ALT: 37 U/L (ref 0–53)
AST: 19 U/L (ref 0–37)
Albumin: 4.7 g/dL (ref 3.5–5.2)
Alkaline Phosphatase: 84 U/L (ref 39–117)
Bilirubin, Direct: 0.1 mg/dL (ref 0.0–0.3)
Total Bilirubin: 0.4 mg/dL (ref 0.2–1.2)
Total Protein: 7.3 g/dL (ref 6.0–8.3)

## 2019-08-19 LAB — TSH: TSH: 3.28 u[IU]/mL (ref 0.35–4.50)

## 2019-08-19 LAB — LIPID PANEL
Cholesterol: 119 mg/dL (ref 0–200)
HDL: 36 mg/dL — ABNORMAL LOW (ref >39.00)
LDL Cholesterol: 68 mg/dL (ref 0–99)
NonHDL: 83.03
Total CHOL/HDL Ratio: 3
Triglycerides: 75 mg/dL (ref 0.0–149.0)
VLDL: 15 mg/dL (ref 0.0–40.0)

## 2019-08-19 LAB — PSA: PSA: 0.38 ng/mL (ref 0.10–4.00)

## 2019-08-19 LAB — HEMOGLOBIN A1C: Hgb A1c MFr Bld: 8.4 % — ABNORMAL HIGH (ref 4.6–6.5)

## 2019-08-19 LAB — MICROALBUMIN / CREATININE URINE RATIO
Creatinine,U: 68.3 mg/dL
Microalb Creat Ratio: 1.1 mg/g (ref 0.0–30.0)
Microalb, Ur: 0.8 mg/dL (ref 0.0–1.9)

## 2019-08-19 NOTE — Patient Instructions (Addendum)
Will notify you  of labs when available.  Work on getting weight back down   Will send results to dr Loanne Drilling.  Contact us about colon cancer screening  cologuard  FIT tests or  colonscopy and we will order testing.    Colorectal Cancer Screening  Colorectal cancer screening is a group of tests that are used to check for colorectal cancer before symptoms develop. Colorectal refers to the colon and rectum. The colon and rectum are located at the end of the digestive tract and carry bowel movements out of the body. Who should have screening? All adults starting at age 51 until age 51 should have screening. Your health care provider may recommend screening at age 51. You will have tests every 1-10 years, depending on your results and the type of screening test. You may have screening tests starting at an earlier age, or more frequently than other people, if you have any of the following risk factors:  A personal or family history of colorectal cancer or abnormal growths (polyps).  Inflammatory bowel disease, such as ulcerative colitis or Crohn's disease.  A history of having radiation treatment to the abdomen or pelvic area for cancer.  Colorectal cancer symptoms, such as changes in bowel habits or blood in your stool.  A type of colon cancer syndrome that is passed from parent to child (hereditary), such as: ? Lynch syndrome. ? Familial adenomatous polyposis. ? Turcot syndrome. ? Peutz-Jeghers syndrome. Screening recommendations for adults who are 51-51 years old vary depending on health. How is screening done? There are several types of colorectal screening tests. You may have one or more of the following:  Guaiac-based fecal occult blood testing. For this test, a stool (feces) sample is checked for hidden (occult) blood, which could be a sign of colorectal cancer.  Fecal immunochemical test (FIT). For this test, a stool sample is checked for blood, which could be a sign of colorectal  cancer.  Stool DNA test. For this test, a stool sample is checked for blood and changes in DNA that could lead to colorectal cancer.  Sigmoidoscopy. During this test, a thin, flexible tube with a camera on the end (sigmoidoscope) is used to examine the rectum and the lower colon.  Colonoscopy. During this test, a long, flexible tube with a camera on the end (colonoscope) is used to examine the entire colon and rectum. With a colonoscopy, it is possible to take a sample of tissue (biopsy) and remove small polyps during the test.  Virtual colonoscopy. Instead of a colonoscope, this type of colonoscopy uses X-rays (CT scan) and computers to produce images of the colon and rectum. What are the benefits of screening? Screening reduces your risk for colorectal cancer and can help identify cancer at an early stage, when the cancer can be removed or treated more easily. It is common for polyps to form in the lining of the colon, especially as you age. These polyps may be cancerous or become cancerous over time. Screening can identify these polyps. What are the risks of screening? Each screening test may have different risks.  Stool sample tests have fewer risks than other types of screening tests. However, you may need more tests to confirm results from a stool sample test.  Screening tests that involve X-rays expose you to low levels of radiation, which may slightly increase your cancer risk. The benefit of detecting cancer outweighs the slight increase in risk.  Screening tests such as sigmoidoscopy and colonoscopy may place you  at risk for bleeding, intestinal damage, infection, or a reaction to medicines given during the exam. Talk with your health care provider to understand your risk for colorectal cancer and to make a screening plan that is right for you. Questions to ask your health care provider  When should I start colorectal cancer screening?  What is my risk for colorectal cancer?  How  often do I need screening?  Which screening tests do I need?  How do I get my test results?  What do my results mean? Where to find more information Learn more about colorectal cancer screening from:  The [place]: www.cancer.org  The [employer]: www.cancer.gov Summary  Colorectal cancer screening is a group of tests used to check for colorectal cancer before symptoms develop.  Screening reduces your risk for colorectal cancer and can help identify cancer at an early stage, when the cancer can be removed or treated more easily.  All adults starting at age 51 until age 51 should have screening. Your health care provider may recommend screening at age 51.  You may have screening tests starting at an earlier age, or more frequently than other people, if you have certain risk factors.  Talk with your health care provider to understand your risk for colorectal cancer and to make a screening plan that is right for you. This information is not intended to replace advice given to you by your health care provider. Make sure you discuss any questions you have with your health care provider. Document Revised: 11/03/2018 Document Reviewed: 04/15/2017 Elsevier Patient Education  [place].

## 2019-08-19 NOTE — Progress Notes (Signed)
This visit occurred during the SARS-CoV-2 public health emergency.  Safety protocols were in place, including screening questions prior to the visit, additional usage of staff PPE, and extensive cleaning of exam room while observing appropriate contact time as indicated for disinfecting solutions.    Chief Complaint  Patient presents with  . Annual Exam    Pt has no concerns today    HPI: Patient  Alfred Johnson  49 y.o. comes in today for Preventive Health Care visit  And update screening  Dm follows per dr Loanne Drilling  No major change in health  But  Overweight a struggle since covid   Health Maintenance  Topic Date Due  . HIV Screening  03/30/1984  . COLONOSCOPY  03/31/2019  . FOOT EXAM  08/18/2019  . HEMOGLOBIN A1C  02/16/2020  . OPHTHALMOLOGY EXAM  05/12/2020  . URINE MICROALBUMIN  08/18/2020  . TETANUS/TDAP  06/07/2025  . INFLUENZA VACCINE  Completed  . PNEUMOCOCCAL POLYSACCHARIDE VACCINE AGE 74-64 HIGH RISK  Completed   Health Maintenance Review LIFESTYLE:  Exercise:   Walk  mostl y some windedness  Felt to weight and  Age  Knee surgery helps.  Tobacco/ETS: no Alcohol:  ocass  Sugar beverage ocass  Sleep:7-8 hours  Drug use: no HH of   3  Cat  Work  Medical illustrator properties   ROS:  GEN/ HEENT: No fever, significant weight changes sweats headaches vision problems hearing changes, eyes check q year  CV/ PULM; No chest pain shortness of breath cough, syncope,edema  change in exercise tolerance. GI /GU: No adominal pain, vomiting, change in bowel habits. No blood in the stool. No significant GU symptoms. Has some frequency and stream issues  Long term acute sx  SKIN/HEME: ,no acute skin rashes suspicious lesions or bleeding. No lymphadenopathy, nodules, masses.  NEURO/ PSYCH:  No neurologic signs such as weakness numbness. No depression anxiety. IMM/ Allergy: No unusual infections.  Allergy .   REST of 12 system review negative except as per HPI   Past Medical  History:  Diagnosis Date  . ANEMIA DUE TO CHRONIC BLOOD LOSS 09/10/2009  . Diabetes mellitus   . Elevated BP   . GERD (gastroesophageal reflux disease)   . HYPERLIPIDEMIA, MIXED 03/23/2007  . Hypertension   . KIDNEY STONE 02/05/2009  . VENTRICULAR FUNCTION, DECREASED 06/21/2007    History reviewed. No pertinent surgical history.  Family History  Problem Relation Age of Onset  . Graves' disease Mother   . Heart attack Father 106       COD  . Heart failure Father   . Kidney disease Father   . Diabetes Brother   . HIV Brother   . Heart attack Maternal Grandfather   . Heart attack Paternal Grandfather   . Heart disease Brother 57       CABG      Outpatient Medications Prior to Visit  Medication Sig Dispense Refill  . aspirin 81 MG tablet Take 81 mg by mouth daily.      Marland Kitchen atorvastatin (LIPITOR) 40 MG tablet Take 40 mg by mouth daily. Take 1/2 20 mg daily    . bromocriptine (PARLODEL) 2.5 MG tablet Take 1 tablet (2.5 mg total) by mouth daily. 90 tablet 3  . cetirizine (ZYRTEC) 10 MG tablet Take 10 mg by mouth daily.    . empagliflozin (JARDIANCE) 25 MG TABS tablet Take 25 mg by mouth daily. 30 tablet 11  . fluticasone (FLONASE) 50 MCG/ACT nasal spray Place 1  spray into both nostrils daily.    Marland Kitchen glucose blood (ONE TOUCH ULTRA TEST) test strip Check blood sugar 5 to 6 times a day or as directed     . meloxicam (MOBIC) 7.5 MG tablet TAKE 2 TABLETS (15 MG TOTAL) BY MOUTH DAILY AS NEEDED FOR PAIN. 30 tablet 2  . omeprazole (PRILOSEC) 20 MG capsule Take 10 mg by mouth daily.     . pioglitazone (ACTOS) 45 MG tablet Take 1 tablet by mouth once daily 90 tablet 0  . Semaglutide,0.25 or 0.5MG /DOS, (OZEMPIC, 0.25 OR 0.5 MG/DOSE,) 2 MG/1.5ML SOPN Inject 0.5 mg into the skin once a week. 3 pen 3  . SitaGLIPtin-MetFORMIN HCl (JANUMET XR) 50-1000 MG TB24 Take 2 tablets by mouth daily. 60 tablet 11   No facility-administered medications prior to visit.     EXAM:  BP 120/60 (BP Location:  Right Arm, Patient Position: Sitting, Cuff Size: Normal)   Pulse 98   Temp 97.6 F (36.4 C) (Temporal)   Ht 5\' 10"  (1.778 m)   Wt 289 lb 3.2 oz (131.2 kg)   SpO2 94%   BMI 41.50 kg/m   Body mass index is 41.5 kg/m. Wt Readings from Last 3 Encounters:  08/19/19 289 lb 3.2 oz (131.2 kg)  05/18/19 284 lb 9.6 oz (129.1 kg)  02/08/19 287 lb 9.6 oz (130.5 kg)    Physical Exam: Vital signs reviewed [id number] is a well-developed well-nourished alert cooperative    who appearsr stated age in no acute distress.  HEENT: normocephalic atraumatic , Eyes: PERRL EOM's full, conjunctiva clear, Nares:., Ears: no deformity EAC's clear TMs with normal landmarks. Mouth OPmaskedNECK: supple without masses, thyromegaly or bruits. CHEST/PULM:  Clear to auscultation and percussion breath sounds equal no wheeze , rales or rhonchi. No chest wall deformities or tenderness. CV: PMI is nondisplaced, S1 S2 no gallops, murmurs, rubs. Peripheral pulses are full without delay.No JVD .  ABDOMEN: Bowel sounds normal nontender  No guard or rebound, no hepato splenomegal no CVA tenderness.   Extremtities:  No clubbing cyanosis or edema, no acute joint swelling or redness no focal atrophy NEURO:  Oriented x3, cranial nerves 3-12 appear to be intact, no obvious focal weakness,gait within normal limits no abnormal reflexes or asymmetrical SKIN: No acute rashes normal turgor, color, no bruising or petechiae. PSYCH: Oriented, good eye contact, no obvious depression anxiety, cognition and judgment appear normal. LN: no cervical axillary inguinal adenopathy  Lab Results  Component Value Date   WBC 8.2 08/19/2019   HGB 16.4 08/19/2019   HCT 49.8 08/19/2019   PLT 271.0 08/19/2019   GLUCOSE 192 (H) 08/19/2019   CHOL 119 08/19/2019   TRIG 75.0 08/19/2019   HDL 36.00 (L) 08/19/2019   LDLDIRECT 114.5 02/26/2007   LDLCALC 68 08/19/2019   ALT 37 08/19/2019   AST 19 08/19/2019   NA 141 08/19/2019   K 4.7 08/19/2019   CL  103 08/19/2019   CREATININE 0.95 08/19/2019   BUN 22 08/19/2019   CO2 23 08/19/2019   TSH 3.28 08/19/2019   PSA 0.38 08/19/2019   HGBA1C 8.4 (H) 08/19/2019   MICROALBUR 0.8 08/19/2019    BP Readings from Last 3 Encounters:  08/19/19 120/60  05/18/19 120/60  02/08/19 120/80    Labplan  reviewed with patient   ASSESSMENT AND PLAN:  Discussed the following assessment and plan:    ICD-10-CM   1. Visit for preventive health examination  123456 Basic metabolic panel    CBC with Differential/Platelet  Hemoglobin A1c    Hepatic function panel    Lipid panel    Microalbumin / creatinine urine ratio    TSH  2. Colon cancer screening  Z12.11   3. Diabetes mellitus without complication (HCC)  [id number] Basic metabolic panel    CBC with Differential/Platelet    Hemoglobin A1c    Hepatic function panel    Lipid panel    Microalbumin / creatinine urine ratio    TSH  4. Prostate cancer screening  Z12.5 PSA  5. BMI 40.0-44.9, adult (HCC)  [id number] Basic metabolic panel    CBC with Differential/Platelet    Hemoglobin A1c    Hepatic function panel    Lipid panel    PSA    Microalbumin / creatinine urine ratio    TSH  6. Need for pneumococcal vaccination  Z23 Pneumococcal polysaccharide vaccine 23-valent greater than or equal to 2yo subcutaneous/IM   Colon screen ave risk options dsic  Will let us know about colo guard ordering  Prefers no in house   Under covid times   Dm per  Endocrine labs  Urine micro psa  Disc  Pneumovax  Booster today   Patient Care Team: Bayron Dalto, Standley Brooking, MD as PCP - General Renato Shin, MD as Attending Physician (Internal Medicine) Calvert Cantor, MD (Ophthalmology) Hayden Pedro, MD as Consulting Physician (Ophthalmology) Debara Pickett Nadean Corwin, MD as Consulting Physician (Cardiology) Patient Instructions  Will notify you  of labs when available.  Work on getting weight back down   Will send results to dr Loanne Drilling.  Contact us about colon cancer  screening  cologuard  FIT tests or  colonscopy and we will order testing.    Colorectal Cancer Screening  Colorectal cancer screening is a group of tests that are used to check for colorectal cancer before symptoms develop. Colorectal refers to the colon and rectum. The colon and rectum are located at the end of the digestive tract and carry bowel movements out of the body. Who should have screening? All adults starting at age 51 until age 45 should have screening. Your health care provider may recommend screening at age 15. You will have tests every 1-10 years, depending on your results and the type of screening test. You may have screening tests starting at an earlier age, or more frequently than other people, if you have any of the following risk factors:  A personal or family history of colorectal cancer or abnormal growths (polyps).  Inflammatory bowel disease, such as ulcerative colitis or Crohn's disease.  A history of having radiation treatment to the abdomen or pelvic area for cancer.  Colorectal cancer symptoms, such as changes in bowel habits or blood in your stool.  A type of colon cancer syndrome that is passed from parent to child (hereditary), such as: ? Lynch syndrome. ? Familial adenomatous polyposis. ? Turcot syndrome. ? Peutz-Jeghers syndrome. Screening recommendations for adults who are 59-73 years old vary depending on health. How is screening done? There are several types of colorectal screening tests. You may have one or more of the following:  Guaiac-based fecal occult blood testing. For this test, a stool (feces) sample is checked for hidden (occult) blood, which could be a sign of colorectal cancer.  Fecal immunochemical test (FIT). For this test, a stool sample is checked for blood, which could be a sign of colorectal cancer.  Stool DNA test. For this test, a stool sample is checked for blood and changes in DNA that could lead to  colorectal  cancer.  Sigmoidoscopy. During this test, a thin, flexible tube with a camera on the end (sigmoidoscope) is used to examine the rectum and the lower colon.  Colonoscopy. During this test, a long, flexible tube with a camera on the end (colonoscope) is used to examine the entire colon and rectum. With a colonoscopy, it is possible to take a sample of tissue (biopsy) and remove small polyps during the test.  Virtual colonoscopy. Instead of a colonoscope, this type of colonoscopy uses X-rays (CT scan) and computers to produce images of the colon and rectum. What are the benefits of screening? Screening reduces your risk for colorectal cancer and can help identify cancer at an early stage, when the cancer can be removed or treated more easily. It is common for polyps to form in the lining of the colon, especially as you age. These polyps may be cancerous or become cancerous over time. Screening can identify these polyps. What are the risks of screening? Each screening test may have different risks.  Stool sample tests have fewer risks than other types of screening tests. However, you may need more tests to confirm results from a stool sample test.  Screening tests that involve X-rays expose you to low levels of radiation, which may slightly increase your cancer risk. The benefit of detecting cancer outweighs the slight increase in risk.  Screening tests such as sigmoidoscopy and colonoscopy may place you at risk for bleeding, intestinal damage, infection, or a reaction to medicines given during the exam. Talk with your health care provider to understand your risk for colorectal cancer and to make a screening plan that is right for you. Questions to ask your health care provider  When should I start colorectal cancer screening?  What is my risk for colorectal cancer?  How often do I need screening?  Which screening tests do I need?  How do I get my test results?  What do my results  mean? Where to find more information Learn more about colorectal cancer screening from:  The [place]: www.cancer.org  The [employer]: www.cancer.gov Summary  Colorectal cancer screening is a group of tests used to check for colorectal cancer before symptoms develop.  Screening reduces your risk for colorectal cancer and can help identify cancer at an early stage, when the cancer can be removed or treated more easily.  All adults starting at age 63 until age 35 should have screening. Your health care provider may recommend screening at age 44.  You may have screening tests starting at an earlier age, or more frequently than other people, if you have certain risk factors.  Talk with your health care provider to understand your risk for colorectal cancer and to make a screening plan that is right for you. This information is not intended to replace advice given to you by your health care provider. Make sure you discuss any questions you have with your health care provider. Document Revised: 11/03/2018 Document Reviewed: 04/15/2017 Elsevier Patient Education  2020 [place] Alfred Johnson M.D.

## 2019-08-22 ENCOUNTER — Telehealth: Payer: Self-pay

## 2019-08-22 NOTE — Telephone Encounter (Signed)
Per Dr. Cordelia Pen request, I am routing this message to the front desk for scheduling purposes.

## 2019-08-22 NOTE — Telephone Encounter (Signed)
-----   Message from Renato Shin, MD sent at 08/19/2019  6:36 PM EST ----- please contact patient: please move up next appt to next avail. ----- Message ----- From: Burnis Medin, MD Sent: 08/19/2019   5:53 PM EST To: Renato Shin, MD, Grayling Congress, CMA  Results are good except  sugar control  Forwarding results to Dr Loanne Drilling

## 2019-08-24 ENCOUNTER — Other Ambulatory Visit: Payer: Self-pay

## 2019-08-24 NOTE — Telephone Encounter (Signed)
LMTCB to move appointment date up

## 2019-08-26 ENCOUNTER — Ambulatory Visit (INDEPENDENT_AMBULATORY_CARE_PROVIDER_SITE_OTHER): Payer: 59 | Admitting: Endocrinology

## 2019-08-26 ENCOUNTER — Encounter: Payer: Self-pay | Admitting: Endocrinology

## 2019-08-26 ENCOUNTER — Other Ambulatory Visit: Payer: Self-pay

## 2019-08-26 VITALS — BP 106/70 | HR 107 | Ht 70.0 in | Wt 294.0 lb

## 2019-08-26 DIAGNOSIS — E119 Type 2 diabetes mellitus without complications: Secondary | ICD-10-CM | POA: Diagnosis not present

## 2019-08-26 MED ORDER — GLUCOSE BLOOD VI STRP: 1.0000 | ORAL_STRIP | Freq: Three times a day (TID) | 11 refills | Status: DC

## 2019-08-26 MED ORDER — METFORMIN HCL ER 500 MG PO TB24: 2000.0000 mg | ORAL_TABLET | Freq: Every day | ORAL | 3 refills | Status: DC

## 2019-08-26 MED ORDER — OZEMPIC (1 MG/DOSE) 2 MG/1.5ML ~~LOC~~ SOPN: 1.0000 mg | PEN_INJECTOR | SUBCUTANEOUS | 3 refills | Status: DC

## 2019-08-26 NOTE — Progress Notes (Signed)
Subjective:    Patient ID: Alfred Johnson, male    DOB: 1970/08/06, 49 y.o.   MRN: [id number]  HPI Pt returns for f/u of diabetes mellitus: DM type: 2 Dx'ed: [id number] Complications: none Therapy: Ozempic and 5 oral meds DKA: never Severe hypoglycemia: never Pancreatitis: never Other: he has never been on insulin; he declines weight loss surgery; he did not tolerate invokana (itching).    Interval history: He says cbg's are in the mid-100's.  pt states he feels well in general.  He takes meds as rx'ed.   Past Medical History:  Diagnosis Date  . ANEMIA DUE TO CHRONIC BLOOD LOSS 09/10/2009  . Diabetes mellitus   . Elevated BP   . GERD (gastroesophageal reflux disease)   . HYPERLIPIDEMIA, MIXED 03/23/2007  . Hypertension   . KIDNEY STONE 02/05/2009  . VENTRICULAR FUNCTION, DECREASED 06/21/2007    No past surgical history on file.  Social History   Socioeconomic History  . Marital status: Married    Spouse name: Not on file  . Number of children: Not on file  . Years of education: Not on file  . Highest education level: Not on file  Occupational History    Employer: SELF-EMPLOYED    Comment: Does not work outside the home  Tobacco Use  . Smoking status: Former Research scientist (life sciences)  . Smokeless tobacco: Former Systems developer    Quit date: 07/28/1992  Substance and Sexual Activity  . Alcohol use: No  . Drug use: No  . Sexual activity: Not on file  Other Topics Concern  . 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    Social Determinants of Health   Financial Resource Strain:   . Difficulty of Paying Living Expenses: Not on file  Food Insecurity:   . Worried About Charity fundraiser in the Last Year: Not on file  . Ran Out of Food in the Last Year: Not on file  Transportation Needs:   . Lack of Transportation (Medical): Not on file   . Lack of Transportation (Non-Medical): Not on file  Physical Activity:   . Days of Exercise per Week: Not on file  . Minutes of Exercise per Session: Not on file  Stress:   . Feeling of Stress : Not on file  Social Connections:   . Frequency of Communication with Friends and Family: Not on file  . Frequency of Social Gatherings with Friends and Family: Not on file  . Attends Religious Services: Not on file  . Active Member of Clubs or Organizations: Not on file  . Attends Archivist Meetings: Not on file  . Marital Status: Not on file  Intimate Partner Violence:   . Fear of Current or Ex-Partner: Not on file  . Emotionally Abused: Not on file  . Physically Abused: Not on file  . Sexually Abused: Not on file    Current Outpatient Medications on File Prior to Visit  Medication Sig Dispense Refill  . aspirin 81 MG tablet Take 81 mg by mouth daily.      Marland Kitchen atorvastatin (LIPITOR) 40 MG tablet Take 40 mg by mouth daily. Take 1/2 20 mg daily    . bromocriptine (PARLODEL) 2.5 MG tablet Take 1 tablet (2.5 mg total)  by mouth daily. 90 tablet 3  . cetirizine (ZYRTEC) 10 MG tablet Take 10 mg by mouth daily.    . empagliflozin (JARDIANCE) 25 MG TABS tablet Take 25 mg by mouth daily. 30 tablet 11  . fluticasone (FLONASE) 50 MCG/ACT nasal spray Place 1 spray into both nostrils daily.    . meloxicam (MOBIC) 7.5 MG tablet TAKE 2 TABLETS (15 MG TOTAL) BY MOUTH DAILY AS NEEDED FOR PAIN. 30 tablet 2  . omeprazole (PRILOSEC) 20 MG capsule Take 10 mg by mouth daily.     . pioglitazone (ACTOS) 45 MG tablet Take 1 tablet by mouth once daily 90 tablet 0   No current facility-administered medications on file prior to visit.    No Known Allergies  Family History  Problem Relation Age of Onset  . Graves' disease Mother   . Heart attack Father 76       COD  . Heart failure Father   . Kidney disease Father   . Diabetes Brother   . HIV Brother   . Heart attack Maternal Grandfather   .  Heart attack Paternal Grandfather   . Heart disease Brother 58       CABG    BP 106/70 (BP Location: Right Arm, Patient Position: Sitting, Cuff Size: Large)   Pulse (!) 107   Ht 5\' 10"  (1.778 m)   Wt 294 lb (133.4 kg)   SpO2 94%   BMI 42.18 kg/m    Review of Systems Nausea is resolved.     Objective:   Physical Exam VITAL SIGNS:  See vs page GENERAL: no distress Pulses: dorsalis pedis intact bilat.   MSK: no deformity of the feet CV: no leg edema Skin:  no ulcer on the feet.  normal color and temp on the feet. Neuro: sensation is intact to touch on the feet    Lab Results  Component Value Date   HGBA1C 8.4 (H) 08/19/2019       Assessment & Plan:  Type 2 DM: worse   Patient Instructions  I have sent a prescription to your pharmacy, to increase the Ozempic, and: Change the Janumet to metformin only.   check your blood sugar 3 times a day.  vary the time of day when you check, between before the 3 meals, and at bedtime.  also check if you have symptoms of your blood sugar being too high or too low.  please keep a record of the readings and bring it to your next appointment here (or you can bring the meter itself).  You can write it on any piece of paper.  please call us sooner if your blood sugar goes below 70, or if you have a lot of readings over 200.  Please come back for a follow-up appointment in 2 months.

## 2019-08-26 NOTE — Patient Instructions (Signed)
I have sent a prescription to your pharmacy, to increase the Ozempic, and: Change the Janumet to metformin only.   check your blood sugar 3 times a day.  vary the time of day when you check, between before the 3 meals, and at bedtime.  also check if you have symptoms of your blood sugar being too high or too low.  please keep a record of the readings and bring it to your next appointment here (or you can bring the meter itself).  You can write it on any piece of paper.  please call us sooner if your blood sugar goes below 70, or if you have a lot of readings over 200.  Please come back for a follow-up appointment in 2 months.

## 2019-09-14 ENCOUNTER — Ambulatory Visit: Payer: 59 | Admitting: Endocrinology

## 2019-09-29 ENCOUNTER — Telehealth: Payer: Self-pay | Admitting: Endocrinology

## 2019-10-02 ENCOUNTER — Other Ambulatory Visit: Payer: Self-pay | Admitting: Endocrinology

## 2019-10-02 DIAGNOSIS — E119 Type 2 diabetes mellitus without complications: Secondary | ICD-10-CM

## 2019-10-03 ENCOUNTER — Other Ambulatory Visit: Payer: Self-pay | Admitting: Endocrinology

## 2019-10-03 DIAGNOSIS — E119 Type 2 diabetes mellitus without complications: Secondary | ICD-10-CM

## 2019-10-05 NOTE — Telephone Encounter (Signed)
NA

## 2019-10-14 ENCOUNTER — Other Ambulatory Visit: Payer: Self-pay | Admitting: Endocrinology

## 2019-10-15 ENCOUNTER — Other Ambulatory Visit: Payer: Self-pay | Admitting: Internal Medicine

## 2019-10-27 ENCOUNTER — Other Ambulatory Visit: Payer: Self-pay

## 2019-11-01 ENCOUNTER — Encounter: Payer: Self-pay | Admitting: Endocrinology

## 2019-11-01 ENCOUNTER — Ambulatory Visit (INDEPENDENT_AMBULATORY_CARE_PROVIDER_SITE_OTHER): Payer: 59 | Admitting: Endocrinology

## 2019-11-01 ENCOUNTER — Other Ambulatory Visit: Payer: Self-pay

## 2019-11-01 VITALS — BP 140/88 | HR 94 | Ht 70.0 in | Wt 282.0 lb

## 2019-11-01 DIAGNOSIS — E119 Type 2 diabetes mellitus without complications: Secondary | ICD-10-CM | POA: Diagnosis not present

## 2019-11-01 LAB — POCT GLYCOSYLATED HEMOGLOBIN (HGB A1C): Hemoglobin A1C: 7.4 % — AB (ref 4.0–5.6)

## 2019-11-01 NOTE — Patient Instructions (Addendum)
Your blood pressure is high today.  Please see your primary care provider soon, to have it rechecked When you can, please try increasing the Ozempic again, and:  Please continue the same other diabetes medications check your blood sugar 3 times a day.  vary the time of day when you check, between before the 3 meals, and at bedtime.  also check if you have symptoms of your blood sugar being too high or too low.  please keep a record of the readings and bring it to your next appointment here (or you can bring the meter itself).  You can write it on any piece of paper.  please call us sooner if your blood sugar goes below 70, or if you have a lot of readings over 200.  Please come back for a follow-up appointment in 3 months.

## 2019-11-01 NOTE — Progress Notes (Signed)
Subjective:    Patient ID: Alfred Johnson, male    DOB: Dec 14, 1970, 49 y.o.   MRN: [id number]  HPI Pt returns for f/u of diabetes mellitus: DM type: 2 Dx'ed: [id number] Complications: none Therapy: Ozempic and 5 oral meds DKA: never Severe hypoglycemia: never Pancreatitis: never Other: he has never been on insulin; he declines weight loss surgery; he did not tolerate invokana (itching).    Interval history: He says cbg's are in the 100's.  He reduced Ozempic to 0.75 mg weekly, due to nausea, which then resolved.   Past Medical History:  Diagnosis Date  . ANEMIA DUE TO CHRONIC BLOOD LOSS 09/10/2009  . Diabetes mellitus   . Elevated BP   . GERD (gastroesophageal reflux disease)   . HYPERLIPIDEMIA, MIXED 03/23/2007  . Hypertension   . KIDNEY STONE 02/05/2009  . VENTRICULAR FUNCTION, DECREASED 06/21/2007    No past surgical history on file.  Social History   Socioeconomic History  . Marital status: Married    Spouse name: Not on file  . Number of children: Not on file  . Years of education: Not on file  . Highest education level: Not on file  Occupational History    Employer: SELF-EMPLOYED    Comment: Does not work outside the home  Tobacco Use  . Smoking status: Former Research scientist (life sciences)  . Smokeless tobacco: Former Systems developer    Quit date: 07/28/1992  Substance and Sexual Activity  . Alcohol use: No  . Drug use: No  . Sexual activity: Not on file  Other Topics Concern  . 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    Social Determinants of Health   Financial Resource Strain:   . Difficulty of Paying Living Expenses:   Food Insecurity:   . Worried About Charity fundraiser in the Last Year:   . Arboriculturist in the Last Year:   Transportation Needs:   . Film/video editor (Medical):   Marland Kitchen Lack of Transportation  (Non-Medical):   Physical Activity:   . Days of Exercise per Week:   . Minutes of Exercise per Session:   Stress:   . Feeling of Stress :   Social Connections:   . Frequency of Communication with Friends and Family:   . Frequency of Social Gatherings with Friends and Family:   . Attends Religious Services:   . Active Member of Clubs or Organizations:   . Attends Archivist Meetings:   Marland Kitchen Marital Status:   Intimate Partner Violence:   . Fear of Current or Ex-Partner:   . Emotionally Abused:   Marland Kitchen Physically Abused:   . Sexually Abused:     Current Outpatient Medications on File Prior to Visit  Medication Sig Dispense Refill  . aspirin 81 MG tablet Take 81 mg by mouth daily.      Marland Kitchen atorvastatin (LIPITOR) 40 MG tablet Take 1/2 a tablet (20mg ) By mouth daily. Please keep upcoming appointment for further refills. 15 tablet 0  . bromocriptine (PARLODEL) 2.5 MG tablet Take 1 tablet (2.5 mg total) by mouth daily. 90 tablet 3  . cetirizine (ZYRTEC) 10 MG tablet Take 10 mg by mouth daily.    . fluticasone (FLONASE) 50 MCG/ACT nasal spray Place 1  spray into both nostrils daily.    Marland Kitchen glucose blood (ONE TOUCH ULTRA TEST) test strip 1 each by Other route 3 (three) times daily. And lancets 3/day 100 each 11  . JARDIANCE 25 MG TABS tablet TAKE 25 MG BY MOUTH DAILY. 90 tablet 3  . meloxicam (MOBIC) 7.5 MG tablet TAKE 2 TABLETS (15 MG TOTAL) BY MOUTH DAILY AS NEEDED FOR PAIN. 30 tablet 2  . metFORMIN (GLUCOPHAGE-XR) 500 MG 24 hr tablet Take 4 tablets (2,000 mg total) by mouth daily with breakfast. 360 tablet 3  . omeprazole (PRILOSEC) 20 MG capsule Take 10 mg by mouth daily.     . pioglitazone (ACTOS) 45 MG tablet Take 1 tablet by mouth once daily 90 tablet 0  . Semaglutide, 1 MG/DOSE, (OZEMPIC, 1 MG/DOSE,) 2 MG/1.5ML SOPN Inject 1 mg into the skin once a week. 12 pen 3   No current facility-administered medications on file prior to visit.    No Known Allergies  Family History  Problem  Relation Age of Onset  . Graves' disease Mother   . Heart attack Father 51       COD  . Heart failure Father   . Kidney disease Father   . Diabetes Brother   . HIV Brother   . Heart attack Maternal Grandfather   . Heart attack Paternal Grandfather   . Heart disease Brother 64       CABG    BP 140/88   Pulse 94   Ht 5\' 10"  (1.778 m)   Wt 282 lb (127.9 kg)   SpO2 96%   BMI 40.46 kg/m    Review of Systems He denies hypoglycemia.      Objective:   Physical Exam VITAL SIGNS:  See vs page GENERAL: no distress Pulses: dorsalis pedis intact bilat.   MSK: no deformity of the feet CV: no leg edema Skin:  no ulcer on the feet.  normal color and temp on the feet. Neuro: sensation is intact to touch on the feet   Lab Results  Component Value Date   HGBA1C 7.4 (A) 11/01/2019       Assessment & Plan:  HTN: is noted today Nausea: worse  Type 2 DM: he would benefit from increased rx, if it can be done with a regimen that avoids or minimizes hypoglycemia.   Patient Instructions  Your blood pressure is high today.  Please see your primary care provider soon, to have it rechecked When you can, please try increasing the Ozempic again, and:  Please continue the same other diabetes medications check your blood sugar 3 times a day.  vary the time of day when you check, between before the 3 meals, and at bedtime.  also check if you have symptoms of your blood sugar being too high or too low.  please keep a record of the readings and bring it to your next appointment here (or you can bring the meter itself).  You can write it on any piece of paper.  please call us sooner if your blood sugar goes below 70, or if you have a lot of readings over 200.  Please come back for a follow-up appointment in 3 months.

## 2019-12-09 ENCOUNTER — Ambulatory Visit (INDEPENDENT_AMBULATORY_CARE_PROVIDER_SITE_OTHER): Payer: 59 | Admitting: Internal Medicine

## 2019-12-09 ENCOUNTER — Encounter: Payer: Self-pay | Admitting: Internal Medicine

## 2019-12-09 ENCOUNTER — Other Ambulatory Visit: Payer: Self-pay

## 2019-12-09 VITALS — BP 121/72 | HR 86 | Temp 96.8°F | Ht 70.0 in | Wt 273.8 lb

## 2019-12-09 DIAGNOSIS — E119 Type 2 diabetes mellitus without complications: Secondary | ICD-10-CM

## 2019-12-09 DIAGNOSIS — E782 Mixed hyperlipidemia: Secondary | ICD-10-CM

## 2019-12-09 DIAGNOSIS — I10 Essential (primary) hypertension: Secondary | ICD-10-CM | POA: Diagnosis not present

## 2019-12-09 MED ORDER — ATORVASTATIN CALCIUM 40 MG PO TABS: ORAL_TABLET | ORAL | 3 refills | Status: DC

## 2019-12-09 NOTE — Progress Notes (Signed)
OFFICE NOTE  Chief Complaint:  No complaints  Primary Care Physician: Burnis Medin, MD  HPI:  Alfred Johnson is a pleasant 49 year old male who is coming referred to me by Dr. Regis Bill for evaluation of coronary disease. His past history significant for diabetes followed by Dr. Loanne Drilling in endocrinology. He does not have hypertension and is not on treatment for any dyslipidemia. His last lipid profile showed an LDL cholesterol 95 with total cholesterol less than 160. He does have a strong family history of heart disease with heart attacks in his father and both grandfathers. He also recently had a brother who had 3 vessel, bypass one month ago. Since that time he's been having some intermittent symptoms with vague, atypical sounding chest pain and occasional lightheadedness. He did have a pneumonia last summer. Fortunately, he is very active. He's been doing significant exercise several days a week including both aerobic and resistance training. He's managed to have no symptoms such as chest pain or worsening shortness of breath during these exercises.  Alfred Johnson returns today for follow-up. He underwent a CT coronary artery calcium score. This resulted back is 0. Unfortunately he was noted to have some small pulmonary nodules. He will need a repeat plain chest CT in one year. He also obtained a cholesterol profile which demonstrated an LDL-P of 1566, LDL-C of 117, HDL 45, triglycerides 64 and total cholesterol 175. Although he has no chronic calcium, the diabetic he should have risk factor modification. We talked about cholesterol medications and he did research with his insurance company as to what medications would be the most cost effective. He is interested in taking Crestor which I think is a good medication.  02/11/2016  Alfred Johnson was seen today in follow-up. Over the past year he's done fairly well. His blood sugars however have been elevated and recently he was started on Invokana  in addition to his other diabetes medicines. Blood pressure appears well controlled today. He denies any chest pain or worsening shortness of breath. Of note his PCP recheck his cholesterol November 2016 which showed a total cholesterol of 86, triglycerides 47, HDL 40 and LDL of 36. This represents significant cholesterol control.  02/17/2017  Alfred Johnson returns today for follow-up. Spent a year since I last saw him. As a recap he had an initial coronary artery calcium score which was 0 however subsequently he had a follow-up of a small pulmonary nodule which was found to be stable, but the radiologist indicated there was a small speck of calcium in the LAD. Either this is relevant however since he is diagnosed as diabetic. He is at high risk and therefore will need a strict cholesterol control with a goal LDL-C less than 70. He was initially placed on atorvastatin 40 mg daily however did have side effects including myalgias on that dose. I asked him to reduce the dose to 20 mg daily a says is tolerating this just fine. He reports taking the medicine regularly. Unfortunately has had some weight gain and says that his diet is "horrible". He says that he goes in phases as far as exercise and diet. I told him that we would really want to be aggressive, particularly looking at his lifetime risk, which he found to be funny. He said that no males in his family have lived to be greater than 67 years old. Despite that, he does want to live a longer, healthier life.  03/19/2018  Alfred Johnson is seen today in  follow-up.  Overall he is doing well without complaints.  Denies any chest pain or worsening shortness of breath.  He is due for repeat lipid profile.  He said his hemoglobin A1c is now down to 6.8.  Recently was started on low-dose bromocriptine which may show some additional benefit apparently diabetics.  Unfortunately not been able to lose a lot of weight.  Is been struggling with a meniscal tear in his knee  which is been repaired and will likely need to be repaired again.  12/09/2019  Alfred Johnson seen today in follow-up.  Overall he continues to do well.  He denies any chest pain or worsening shortness of breath.  Recently he has lost about 20 pounds.  He started to become more physically active.  Hemoglobin A1c in January was 8.4 this come down to 7.1.  His LDL was 68.  He needs a refill of his atorvastatin.  Blood pressures well controlled today 121/72.  EKG shows normal sinus rhythm.   PMHx:  Past Medical History:  Diagnosis Date  . ANEMIA DUE TO CHRONIC BLOOD LOSS 09/10/2009  . Diabetes mellitus   . Elevated BP   . GERD (gastroesophageal reflux disease)   . HYPERLIPIDEMIA, MIXED 03/23/2007  . Hypertension   . KIDNEY STONE 02/05/2009  . VENTRICULAR FUNCTION, DECREASED 06/21/2007    No past surgical history on file.  FAMHx:  Family History  Problem Relation Age of Onset  . Graves' disease Mother   . Heart attack Father 43       COD  . Heart failure Father   . Kidney disease Father   . Diabetes Brother   . HIV Brother   . Heart attack Maternal Grandfather   . Heart attack Paternal Grandfather   . Heart disease Brother 39       CABG    SOCHx:   reports that he has quit smoking. He quit smokeless tobacco use about 27 years ago. He reports that he does not drink alcohol or use drugs.  ALLERGIES:  No Known Allergies  ROS: Pertinent items noted in HPI and remainder of comprehensive ROS otherwise negative.  HOME MEDS: Current Outpatient Medications  Medication Sig Dispense Refill  . aspirin 81 MG tablet Take 81 mg by mouth daily.      Marland Kitchen atorvastatin (LIPITOR) 40 MG tablet Take 1/2 a tablet (20mg ) By mouth daily. Please keep upcoming appointment for further refills. 15 tablet 0  . bromocriptine (PARLODEL) 2.5 MG tablet Take 1 tablet (2.5 mg total) by mouth daily. 90 tablet 3  . cetirizine (ZYRTEC) 10 MG tablet Take 10 mg by mouth daily.    . fluticasone (FLONASE) 50 MCG/ACT  nasal spray Place 1 spray into both nostrils daily.    Marland Kitchen glucose blood (ONE TOUCH ULTRA TEST) test strip 1 each by Other route 3 (three) times daily. And lancets 3/day 100 each 11  . JARDIANCE 25 MG TABS tablet TAKE 25 MG BY MOUTH DAILY. 90 tablet 3  . meloxicam (MOBIC) 7.5 MG tablet TAKE 2 TABLETS (15 MG TOTAL) BY MOUTH DAILY AS NEEDED FOR PAIN. 30 tablet 2  . metFORMIN (GLUCOPHAGE-XR) 500 MG 24 hr tablet Take 4 tablets (2,000 mg total) by mouth daily with breakfast. 360 tablet 3  . omeprazole (PRILOSEC) 20 MG capsule Take 10 mg by mouth daily.     . pioglitazone (ACTOS) 45 MG tablet Take 1 tablet by mouth once daily 90 tablet 0  . Semaglutide, 1 MG/DOSE, (OZEMPIC, 1 MG/DOSE,) 2 MG/1.5ML SOPN  Inject 1 mg into the skin once a week. 12 pen 3   No current facility-administered medications for this visit.    LABS/IMAGING: No results found for this or any previous visit (from the past 48 hour(s)). No results found.  WEIGHTS: Wt Readings from Last 3 Encounters:  12/09/19 273 lb 12.8 oz (124.2 kg)  11/01/19 282 lb (127.9 kg)  08/26/19 294 lb (133.4 kg)    VITALS: BP 121/72   Pulse 86   Temp (!) 96.8 F (36 C)   Ht 5\' 10"  (1.778 m)   Wt 273 lb 12.8 oz (124.2 kg)   SpO2 95%   BMI 39.29 kg/m   EXAM: General appearance: alert, no distress and moderately obese Neck: no carotid bruit, no JVD and thyroid not enlarged, symmetric, no tenderness/mass/nodules Lungs: clear to auscultation bilaterally Heart: regular rate and rhythm, S1, S2 normal, no murmur, click, rub or gallop Abdomen: soft, non-tender; bowel sounds normal; no masses,  no organomegaly Extremities: extremities normal, atraumatic, no cyanosis or edema Pulses: 2+ and symmetric Skin: Skin color, texture, turgor normal. No rashes or lesions Neurologic: Grossly normal Psych: Pleasant  EKG: Normal sinus rhythm 86-personally reviewed  ASSESSMENT: 1. Atypical chest pain - Zero CAC 2. Strong family history of coronary  disease 3. Type 2 diabetes 4. Obesity 5. Dyslipidemia 6. Solitary pulmonary nodule - benign  PLAN: 1.   Alfred Johnson continues to do well.  He is working on weight loss recently had 20 pound loss.  A1c had crept up a little during the pandemic but is back down to 7.1.  LDL 68.  He needs a refill of his statin.  I encouraged continued exercise and weight loss.  He has no chest pain symptoms.  EKG is stable and normal.   Follow-up with me annually or sooner as necessary.  Pixie Casino, MD, [facility], [place] Director of the Advanced Lipid Disorders &  Cardiovascular Risk Reduction Clinic Diplomate of the American Board of Clinical Lipidology Attending Cardiologist  Direct Dial: [phone]  Fax: [phone]  Website:  [email or web address] 12/09/2019, 9:45 AM

## 2019-12-09 NOTE — Patient Instructions (Signed)
Medication Instructions:  Your physician recommends that you continue on your current medications as directed. Please refer to the Current Medication list given to you today.  *If you need a refill on your cardiac medications before your next appointment, please call your pharmacy*   Follow-Up: At [facility], you and your health needs are our priority.  As part of our continuing mission to provide you with exceptional heart care, we have created designated Provider Care Teams.  These Care Teams include your primary Cardiologist (physician) and Advanced Practice Providers (APPs -  Physician Assistants and Nurse Practitioners) who all work together to provide you with the care you need, when you need it.  We recommend signing up for the patient portal called "MyChart".  Sign up information is provided on this After Visit Summary.  MyChart is used to connect with patients for Virtual Visits (Telemedicine).  Patients are able to view lab/test results, encounter notes, upcoming appointments, etc.  Non-urgent messages can be sent to your provider as well.   To learn more about what you can do with MyChart, go to [email or web address].    Your next appointment:   12 month(s)  The format for your next appointment:   In Person  Provider:   You may see Dr.Kenneth Hilty or one of the following Advanced Practice Providers on your designated Care Team:    Hao Meng, PA-C  Angela Duke, PA-C or   Krista Kroeger, PA-C    Other Instructions   

## 2019-12-14 ENCOUNTER — Other Ambulatory Visit: Payer: Self-pay | Admitting: Endocrinology

## 2020-02-07 ENCOUNTER — Other Ambulatory Visit: Payer: Self-pay

## 2020-02-07 ENCOUNTER — Ambulatory Visit (INDEPENDENT_AMBULATORY_CARE_PROVIDER_SITE_OTHER): Payer: 59 | Admitting: Endocrinology

## 2020-02-07 VITALS — BP 130/88 | HR 84 | Ht 70.0 in | Wt 271.4 lb

## 2020-02-07 DIAGNOSIS — E119 Type 2 diabetes mellitus without complications: Secondary | ICD-10-CM

## 2020-02-07 LAB — POCT GLYCOSYLATED HEMOGLOBIN (HGB A1C): Hemoglobin A1C: 7.2 % — AB (ref 4.0–5.6)

## 2020-02-07 NOTE — Patient Instructions (Addendum)
Please continue the same diabetes medications check your blood sugar 3 times a day.  vary the time of day when you check, between before the 3 meals, and at bedtime.  also check if you have symptoms of your blood sugar being too high or too low.  please keep a record of the readings and bring it to your next appointment here (or you can bring the meter itself).  You can write it on any piece of paper.  please call us sooner if your blood sugar goes below 70, or if you have a lot of readings over 200.  Please come back for a follow-up appointment in 3 months.

## 2020-02-07 NOTE — Progress Notes (Signed)
Subjective:    Patient ID: Alfred Johnson, male    DOB: [birth year], 49 y.o.   MRN: [id number]  HPI Pt returns for f/u of diabetes mellitus: DM type: 2 Dx'ed: 3267 Complications: none Therapy: Ozempic and 4 oral meds DKA: never Severe hypoglycemia: never Pancreatitis: never Other: he has never been on insulin; he declines weight loss surgery; he did not tolerate invokana (itching).    Interval history: He says cbg's are in the 100's.  nausea persists.   Past Medical History:  Diagnosis Date  . ANEMIA DUE TO CHRONIC BLOOD LOSS 09/10/2009  . Diabetes mellitus   . Elevated BP   . GERD (gastroesophageal reflux disease)   . HYPERLIPIDEMIA, MIXED 03/23/2007  . Hypertension   . KIDNEY STONE 02/05/2009  . VENTRICULAR FUNCTION, DECREASED 06/21/2007    No past surgical history on file.  Social History   Socioeconomic History  . Marital status: Married    Spouse name: Not on file  . Number of children: Not on file  . Years of education: Not on file  . Highest education level: Not on file  Occupational History    Employer: SELF-EMPLOYED    Comment: Does not work outside the home  Tobacco Use  . Smoking status: Former Research scientist (life sciences)  . Smokeless tobacco: Former Systems developer    Quit date: 07/28/1992  Substance and Sexual Activity  . Alcohol use: No  . Drug use: No  . Sexual activity: Not on file  Other Topics Concern  . 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    Social Determinants of Health   Financial Resource Strain:   . Difficulty of Paying Living Expenses:   Food Insecurity:   . Worried About Charity fundraiser in the Last Year:   . Arboriculturist in the Last Year:   Transportation Needs:   . Film/video editor (Medical):   Marland Kitchen Lack of Transportation (Non-Medical):   Physical Activity:   . Days of Exercise per  Week:   . Minutes of Exercise per Session:   Stress:   . Feeling of Stress :   Social Connections:   . Frequency of Communication with Friends and Family:   . Frequency of Social Gatherings with Friends and Family:   . Attends Religious Services:   . Active Member of Clubs or Organizations:   . Attends Archivist Meetings:   Marland Kitchen Marital Status:   Intimate Partner Violence:   . Fear of Current or Ex-Partner:   . Emotionally Abused:   Marland Kitchen Physically Abused:   . Sexually Abused:     Current Outpatient Medications on File Prior to Visit  Medication Sig Dispense Refill  . bromocriptine (PARLODEL) 2.5 MG tablet Take 1 tablet (2.5 mg total) by mouth daily. 90 tablet 3  . glucose blood (ONE TOUCH ULTRA TEST) test strip 1 each by Other route 3 (three) times daily. And lancets 3/day 100 each 11  . JARDIANCE 25 MG TABS tablet TAKE 25 MG BY MOUTH DAILY. 90 tablet 3  . metFORMIN (GLUCOPHAGE-XR) 500 MG 24 hr tablet Take 4 tablets (2,000 mg total) by mouth daily with breakfast. 360 tablet 3  . pioglitazone (ACTOS) 45 MG tablet Take 1 tablet by mouth once  daily 90 tablet 0  . Semaglutide, 1 MG/DOSE, (OZEMPIC, 1 MG/DOSE,) 2 MG/1.5ML SOPN Inject 1 mg into the skin once a week. 12 pen 3  . aspirin 81 MG tablet Take 81 mg by mouth daily.      Marland Kitchen atorvastatin (LIPITOR) 40 MG tablet Take 1/2 a tablet (20mg ) by mouth daily. 45 tablet 3  . cetirizine (ZYRTEC) 10 MG tablet Take 10 mg by mouth daily.    . fluticasone (FLONASE) 50 MCG/ACT nasal spray Place 1 spray into both nostrils daily.    . meloxicam (MOBIC) 7.5 MG tablet TAKE 2 TABLETS (15 MG TOTAL) BY MOUTH DAILY AS NEEDED FOR PAIN. 30 tablet 2  . omeprazole (PRILOSEC) 20 MG capsule Take 10 mg by mouth daily.      No current facility-administered medications on file prior to visit.    No Known Allergies  Family History  Problem Relation Age of Onset  . Graves' disease Mother   . Heart attack Father 5       COD  . Heart failure Father   .  Kidney disease Father   . Diabetes Brother   . HIV Brother   . Heart attack Maternal Grandfather   . Heart attack Paternal Grandfather   . Heart disease Brother 74       CABG    BP 130/88 (BP Location: Left Arm, Patient Position: Sitting)   Pulse 84   Ht 5\' 10"  (1.778 m)   Wt 271 lb 6.4 oz (123.1 kg)   SpO2 96%   BMI 38.94 kg/m    Review of Systems He has lost weight, since last ov.      Objective:   Physical Exam VITAL SIGNS:  See vs page GENERAL: no distress Pulses: dorsalis pedis intact bilat.   MSK: no deformity of the feet CV: no leg edema Skin:  no ulcer on the feet.  normal color and temp on the feet. Neuro: sensation is intact to touch on the feet  Lab Results  Component Value Date   HGBA1C 7.2 (A) 02/07/2020       Assessment & Plan:  Nausea, due to Ozempic.  We discussed.  He chooses to continue the same. Type 2 DM: She would benefit from increased rx, but declines.    Patient Instructions  Please continue the same diabetes medications check your blood sugar 3 times a day.  vary the time of day when you check, between before the 3 meals, and at bedtime.  also check if you have symptoms of your blood sugar being too high or too low.  please keep a record of the readings and bring it to your next appointment here (or you can bring the meter itself).  You can write it on any piece of paper.  please call us sooner if your blood sugar goes below 70, or if you have a lot of readings over 200.  Please come back for a follow-up appointment in 3 months.

## 2020-02-20 ENCOUNTER — Other Ambulatory Visit: Payer: Self-pay | Admitting: Endocrinology

## 2020-02-20 ENCOUNTER — Other Ambulatory Visit: Payer: Self-pay

## 2020-02-20 MED ORDER — OZEMPIC (1 MG/DOSE) 4 MG/3ML ~~LOC~~ SOPN: 1.0000 mg | PEN_INJECTOR | SUBCUTANEOUS | 3 refills | Status: DC

## 2020-02-21 ENCOUNTER — Other Ambulatory Visit: Payer: Self-pay

## 2020-02-28 ENCOUNTER — Telehealth: Payer: Self-pay | Admitting: Endocrinology

## 2020-02-28 NOTE — Telephone Encounter (Signed)
Patient requests to be called at ph# [phone] re: clarification of dosage for Ozempic

## 2020-02-28 NOTE — Telephone Encounter (Signed)
Returned pt call. Pt expressed confusion because he use to get 2 Ozempic pens for a 30 day supply but he felt the pharmacy shorted him by 1 pen. Advised packaging has changed. His previous Rx (previous packaging) has been discontinued. Going forward, he will now get 1 pen per month which contains all doses needed for 1 month. Verbalized acceptance and understanding.

## 2020-04-21 ENCOUNTER — Other Ambulatory Visit: Payer: Self-pay | Admitting: Endocrinology

## 2020-05-15 LAB — HM DIABETES EYE EXAM

## 2020-05-18 ENCOUNTER — Encounter: Payer: Self-pay | Admitting: Internal Medicine

## 2020-05-30 ENCOUNTER — Ambulatory Visit (INDEPENDENT_AMBULATORY_CARE_PROVIDER_SITE_OTHER): Payer: 59 | Admitting: Endocrinology

## 2020-05-30 ENCOUNTER — Other Ambulatory Visit: Payer: Self-pay

## 2020-05-30 ENCOUNTER — Encounter: Payer: Self-pay | Admitting: Endocrinology

## 2020-05-30 VITALS — BP 120/80 | HR 86 | Ht 70.0 in | Wt 268.0 lb

## 2020-05-30 DIAGNOSIS — E119 Type 2 diabetes mellitus without complications: Secondary | ICD-10-CM

## 2020-05-30 LAB — POCT GLYCOSYLATED HEMOGLOBIN (HGB A1C): Hemoglobin A1C: 7.1 % — AB (ref 4.0–5.6)

## 2020-05-30 MED ORDER — OZEMPIC (0.25 OR 0.5 MG/DOSE) 2 MG/1.5ML ~~LOC~~ SOPN: 1.5000 mg | PEN_INJECTOR | SUBCUTANEOUS | 3 refills | Status: DC

## 2020-05-30 NOTE — Patient Instructions (Addendum)
I have sent a prescription to your pharmacy, to increase the Ozempic to 1.5 mg weekly. Please continue the same other diabetes medications.   check your blood sugar 3 times a day.  vary the time of day when you check, between before the 3 meals, and at bedtime.  also check if you have symptoms of your blood sugar being too high or too low.  please keep a record of the readings and bring it to your next appointment here (or you can bring the meter itself).  You can write it on any piece of paper.  please call us sooner if your blood sugar goes below 70, or if you have a lot of readings over 200.  Please come back for a follow-up appointment in 3-4 months.

## 2020-05-30 NOTE — Progress Notes (Signed)
Subjective:    Patient ID: Alfred Johnson, male    DOB: July 27, 1971, 49 y.o.   MRN: [id number]  HPI Pt returns for f/u of diabetes mellitus: DM type: 2 Dx'ed: 7322 Complications: none Therapy: Ozempic and 4 oral meds DKA: never Severe hypoglycemia: never Pancreatitis: never Other: he has never been on insulin; he declines weight loss surgery; he did not tolerate invokana (itching).    Interval history: He says cbg's are in the 100's.  intermitt nausea persists.  He takes meds as rx'ed.   Past Medical History:  Diagnosis Date  . ANEMIA DUE TO CHRONIC BLOOD LOSS 09/10/2009  . Diabetes mellitus   . Elevated BP   . GERD (gastroesophageal reflux disease)   . HYPERLIPIDEMIA, MIXED 03/23/2007  . Hypertension   . KIDNEY STONE 02/05/2009  . VENTRICULAR FUNCTION, DECREASED 06/21/2007    No past surgical history on file.  Social History   Socioeconomic History  . Marital status: Married    Spouse name: Not on file  . Number of children: Not on file  . Years of education: Not on file  . Highest education level: Not on file  Occupational History    Employer: SELF-EMPLOYED    Comment: Does not work outside the home  Tobacco Use  . Smoking status: Former Research scientist (life sciences)  . Smokeless tobacco: Former Systems developer    Quit date: 07/28/1992  Substance and Sexual Activity  . Alcohol use: No  . Drug use: No  . Sexual activity: Not on file  Other Topics Concern  . 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    Social Determinants of Health   Financial Resource Strain:   . Difficulty of Paying Living Expenses: Not on file  Food Insecurity:   . Worried About Charity fundraiser in the Last Year: Not on file  . Ran Out of Food in the Last Year: Not on file  Transportation Needs:   . Lack of Transportation (Medical): Not on file  . Lack of  Transportation (Non-Medical): Not on file  Physical Activity:   . Days of Exercise per Week: Not on file  . Minutes of Exercise per Session: Not on file  Stress:   . Feeling of Stress : Not on file  Social Connections:   . Frequency of Communication with Friends and Family: Not on file  . Frequency of Social Gatherings with Friends and Family: Not on file  . Attends Religious Services: Not on file  . Active Member of Clubs or Organizations: Not on file  . Attends Archivist Meetings: Not on file  . Marital Status: Not on file  Intimate Partner Violence:   . Fear of Current or Ex-Partner: Not on file  . Emotionally Abused: Not on file  . Physically Abused: Not on file  . Sexually Abused: Not on file    Current Outpatient Medications on File Prior to Visit  Medication Sig Dispense Refill  . aspirin 81 MG tablet Take 81 mg by mouth daily.      Marland Kitchen atorvastatin (LIPITOR) 40 MG tablet Take 1/2 a tablet (20mg ) by mouth daily. 45 tablet 3  . bromocriptine (PARLODEL) 2.5 MG tablet TAKE 1 TABLET BY MOUTH EVERY DAY 90 tablet 3  .  cetirizine (ZYRTEC) 10 MG tablet Take 10 mg by mouth daily.    . fluticasone (FLONASE) 50 MCG/ACT nasal spray Place 1 spray into both nostrils daily.    Marland Kitchen glucose blood (ONE TOUCH ULTRA TEST) test strip 1 each by Other route 3 (three) times daily. And lancets 3/day 100 each 11  . JARDIANCE 25 MG TABS tablet TAKE 25 MG BY MOUTH DAILY. 90 tablet 3  . meloxicam (MOBIC) 7.5 MG tablet TAKE 2 TABLETS (15 MG TOTAL) BY MOUTH DAILY AS NEEDED FOR PAIN. 30 tablet 2  . metFORMIN (GLUCOPHAGE-XR) 500 MG 24 hr tablet Take 4 tablets (2,000 mg total) by mouth daily with breakfast. 360 tablet 3  . omeprazole (PRILOSEC) 20 MG capsule Take 10 mg by mouth daily.     . pioglitazone (ACTOS) 45 MG tablet Take 1 tablet by mouth once daily 90 tablet 0   No current facility-administered medications on file prior to visit.    No Known Allergies  Family History  Problem Relation  Age of Onset  . Graves' disease Mother   . Heart attack Father 85       COD  . Heart failure Father   . Kidney disease Father   . Diabetes Brother   . HIV Brother   . Heart attack Maternal Grandfather   . Heart attack Paternal Grandfather   . Heart disease Brother 48       CABG    BP 120/80   Pulse 86   Ht 5\' 10"  (1.778 m)   Wt 268 lb (121.6 kg)   SpO2 95%   BMI 38.45 kg/m    Review of Systems He has lost a few lbs.      Objective:   Physical Exam VITAL SIGNS:  See vs page GENERAL: no distress Pulses: dorsalis pedis intact bilat.   MSK: no deformity of the feet CV: no leg edema Skin:  no ulcer on the feet.  normal color and temp on the feet. Neuro: sensation is intact to touch on the feet  Lab Results  Component Value Date   HGBA1C 7.1 (A) 05/30/2020       Assessment & Plan:  Type 2 DM: uncontrolled Nausea, due to Trulicity.  We discussed.  He decides to increase anyway.    Patient Instructions  I have sent a prescription to your pharmacy, to increase the Ozempic to 1.5 mg weekly. Please continue the same other diabetes medications.   check your blood sugar 3 times a day.  vary the time of day when you check, between before the 3 meals, and at bedtime.  also check if you have symptoms of your blood sugar being too high or too low.  please keep a record of the readings and bring it to your next appointment here (or you can bring the meter itself).  You can write it on any piece of paper.  please call us sooner if your blood sugar goes below 70, or if you have a lot of readings over 200.  Please come back for a follow-up appointment in 3-4 months.

## 2020-05-31 ENCOUNTER — Other Ambulatory Visit: Payer: Self-pay

## 2020-05-31 MED ORDER — OZEMPIC (0.25 OR 0.5 MG/DOSE) 2 MG/1.5ML ~~LOC~~ SOPN: 2.0000 mg | PEN_INJECTOR | SUBCUTANEOUS | 3 refills | Status: DC

## 2020-06-04 ENCOUNTER — Other Ambulatory Visit: Payer: Self-pay

## 2020-06-04 ENCOUNTER — Telehealth: Payer: Self-pay | Admitting: Endocrinology

## 2020-06-04 NOTE — Telephone Encounter (Signed)
Prescribed dosage was 1.5 mg (2 mg was sent in error by staff).  1.5 mg is requested amount (max dosage is 2.4 mg).

## 2020-06-05 NOTE — Telephone Encounter (Signed)
Called [facility]--verified needed Rx Ozempic 1.5 mg, which the pharmacy is confused. Please advise.

## 2020-06-05 NOTE — Telephone Encounter (Signed)
please contact patient: Pharmacy is unaware of the higher dosage, so we have to give up.  I'll see you next time.

## 2020-06-05 NOTE — Telephone Encounter (Signed)
LVM-- regarding Rx Ozempic and will see pt next appt.

## 2020-08-02 ENCOUNTER — Other Ambulatory Visit: Payer: Self-pay | Admitting: Endocrinology

## 2020-09-06 ENCOUNTER — Other Ambulatory Visit: Payer: Self-pay | Admitting: Endocrinology

## 2020-09-06 DIAGNOSIS — E119 Type 2 diabetes mellitus without complications: Secondary | ICD-10-CM

## 2020-09-10 ENCOUNTER — Telehealth: Payer: Self-pay

## 2020-09-10 NOTE — Telephone Encounter (Signed)
Pt PA for Ozempic 1mg  pen has been approved from 09/08/2020-09/08/2021

## 2020-09-11 ENCOUNTER — Other Ambulatory Visit: Payer: Self-pay | Admitting: *Deleted

## 2020-09-11 DIAGNOSIS — E119 Type 2 diabetes mellitus without complications: Secondary | ICD-10-CM

## 2020-09-11 MED ORDER — METFORMIN HCL ER 500 MG PO TB24
2000.0000 mg | ORAL_TABLET | Freq: Every day | ORAL | 3 refills | Status: DC
  Filled 2021-08-05: qty 360, 90d supply, fill #0

## 2020-10-02 ENCOUNTER — Ambulatory Visit (INDEPENDENT_AMBULATORY_CARE_PROVIDER_SITE_OTHER): Payer: 59 | Admitting: Endocrinology

## 2020-10-02 ENCOUNTER — Other Ambulatory Visit: Payer: Self-pay

## 2020-10-02 VITALS — BP 124/80 | HR 90 | Ht 70.0 in | Wt 274.4 lb

## 2020-10-02 DIAGNOSIS — E119 Type 2 diabetes mellitus without complications: Secondary | ICD-10-CM | POA: Diagnosis not present

## 2020-10-02 LAB — POCT GLYCOSYLATED HEMOGLOBIN (HGB A1C): Hemoglobin A1C: 7 % — AB (ref 4.0–5.6)

## 2020-10-02 MED ORDER — OZEMPIC (1 MG/DOSE) 2 MG/1.5ML ~~LOC~~ SOPN: 1.0000 mg | PEN_INJECTOR | SUBCUTANEOUS | 3 refills | Status: DC

## 2020-10-02 NOTE — Patient Instructions (Addendum)
Please continue the same 5 diabetes medications.   check your blood sugar 3 times a day.  vary the time of day when you check, between before the 3 meals, and at bedtime.  also check if you have symptoms of your blood sugar being too high or too low.  please keep a record of the readings and bring it to your next appointment here (or you can bring the meter itself).  You can write it on any piece of paper.  please call us sooner if your blood sugar goes below 70, or if you have a lot of readings over 200.  Please come back for a follow-up appointment in 4 months.

## 2020-10-02 NOTE — Progress Notes (Signed)
Subjective:    Patient ID: Alfred Johnson, male    DOB: 1970-09-04, 50 y.o.   MRN: [id number]  HPI Pt returns for f/u of diabetes mellitus: DM type: 2 Dx'ed: 7342 Complications: none Therapy: Ozempic and 4 oral meds DKA: never Severe hypoglycemia: never Pancreatitis: never Other: he has never been on insulin; he declines weight loss surgery; he did not tolerate invokana (itching).    Interval history: He says cbg's are in the 100's.  It is in general highest at HS.  He takes meds as rx'ed.  pharmacy declined to fill Ozempic 1.5 mg dosage.   Past Medical History:  Diagnosis Date  . ANEMIA DUE TO CHRONIC BLOOD LOSS 09/10/2009  . Diabetes mellitus   . Elevated BP   . GERD (gastroesophageal reflux disease)   . HYPERLIPIDEMIA, MIXED 03/23/2007  . Hypertension   . KIDNEY STONE 02/05/2009  . VENTRICULAR FUNCTION, DECREASED 06/21/2007    No past surgical history on file.  Social History   Socioeconomic History  . Marital status: Married    Spouse name: Not on file  . Number of children: Not on file  . Years of education: Not on file  . Highest education level: Not on file  Occupational History    Employer: SELF-EMPLOYED    Comment: Does not work outside the home  Tobacco Use  . Smoking status: Former Research scientist (life sciences)  . Smokeless tobacco: Former Systems developer    Quit date: 07/28/1992  Substance and Sexual Activity  . Alcohol use: No  . Drug use: No  . Sexual activity: Not on file  Other Topics Concern  . 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    Social Determinants of Health   Financial Resource Strain: Not on file  Food Insecurity: Not on file  Transportation Needs: Not on file  Physical Activity: Not on file  Stress: Not on file  Social Connections: Not on file  Intimate Partner Violence: Not on file    Current  Outpatient Medications on File Prior to Visit  Medication Sig Dispense Refill  . aspirin 81 MG tablet Take 81 mg by mouth daily.    Marland Kitchen atorvastatin (LIPITOR) 40 MG tablet Take 1/2 a tablet (20mg ) by mouth daily. 45 tablet 3  . bromocriptine (PARLODEL) 2.5 MG tablet TAKE 1 TABLET BY MOUTH EVERY DAY 90 tablet 3  . cetirizine (ZYRTEC) 10 MG tablet Take 10 mg by mouth daily.    . fluticasone (FLONASE) 50 MCG/ACT nasal spray Place 1 spray into both nostrils daily.    Marland Kitchen glucose blood (ONE TOUCH ULTRA TEST) test strip 1 each by Other route 3 (three) times daily. And lancets 3/day 100 each 11  . JARDIANCE 25 MG TABS tablet TAKE 25 MG BY MOUTH DAILY. 90 tablet 3  . meloxicam (MOBIC) 7.5 MG tablet TAKE 2 TABLETS (15 MG TOTAL) BY MOUTH DAILY AS NEEDED FOR PAIN. 30 tablet 2  . metFORMIN (GLUCOPHAGE-XR) 500 MG 24 hr tablet Take 4 tablets (2,000 mg total) by mouth daily with breakfast. 360 tablet 3  . omeprazole (PRILOSEC) 20 MG capsule Take 10 mg by mouth daily.     . pioglitazone (ACTOS) 45 MG tablet Take 1 tablet by mouth once daily 90 tablet 1  No current facility-administered medications on file prior to visit.    No Known Allergies  Family History  Problem Relation Age of Onset  . Graves' disease Mother   . Heart attack Father 72       COD  . Heart failure Father   . Kidney disease Father   . Diabetes Brother   . HIV Brother   . Heart attack Maternal Grandfather   . Heart attack Paternal Grandfather   . Heart disease Brother 37       CABG    BP 124/80 (BP Location: Right Arm, Patient Position: Sitting, Cuff Size: Large)   Pulse 90   Ht 5\' 10"  (1.778 m)   Wt 274 lb 6.4 oz (124.5 kg)   SpO2 95%   BMI 39.37 kg/m   Review of Systems Denies nausea.      Objective:   Physical Exam VITAL SIGNS:  See vs page.   GENERAL: no distress Pulses: dorsalis pedis intact bilat.   MSK: no deformity of the feet.   CV: no leg edema.   Skin:  no ulcer on the feet.  normal color and temp on the  feet.   Neuro: sensation is intact to touch on the feet.     Lab Results  Component Value Date   HGBA1C 7.0 (A) 10/02/2020       Assessment & Plan:  Type 2 DM: uncontrolled.  I advised pt to add "repaglinide."  He declines.     Patient Instructions  Please continue the same 5 diabetes medications.   check your blood sugar 3 times a day.  vary the time of day when you check, between before the 3 meals, and at bedtime.  also check if you have symptoms of your blood sugar being too high or too low.  please keep a record of the readings and bring it to your next appointment here (or you can bring the meter itself).  You can write it on any piece of paper.  please call us sooner if your blood sugar goes below 70, or if you have a lot of readings over 200.  Please come back for a follow-up appointment in 4 months.

## 2020-12-01 ENCOUNTER — Other Ambulatory Visit: Payer: Self-pay | Admitting: Internal Medicine

## 2020-12-13 ENCOUNTER — Other Ambulatory Visit: Payer: Self-pay | Admitting: Endocrinology

## 2021-01-10 ENCOUNTER — Ambulatory Visit: Payer: 59 | Admitting: General Practice

## 2021-02-05 ENCOUNTER — Encounter: Payer: Self-pay | Admitting: Endocrinology

## 2021-02-05 ENCOUNTER — Other Ambulatory Visit: Payer: Self-pay

## 2021-02-05 ENCOUNTER — Ambulatory Visit (INDEPENDENT_AMBULATORY_CARE_PROVIDER_SITE_OTHER): Payer: 59 | Admitting: Endocrinology

## 2021-02-05 VITALS — BP 128/88 | HR 81 | Ht 70.0 in | Wt 276.0 lb

## 2021-02-05 DIAGNOSIS — E119 Type 2 diabetes mellitus without complications: Secondary | ICD-10-CM | POA: Diagnosis not present

## 2021-02-05 LAB — POCT GLYCOSYLATED HEMOGLOBIN (HGB A1C): Hemoglobin A1C: 7.5 % — AB (ref 4.0–5.6)

## 2021-02-05 MED ORDER — OZEMPIC (2 MG/DOSE) 8 MG/3ML ~~LOC~~ SOPN
2.0000 mg | PEN_INJECTOR | SUBCUTANEOUS | 2 refills | Status: DC
  Filled 2021-05-15: qty 9, 84d supply, fill #0
  Filled 2021-08-05 – 2021-08-08 (×2): qty 9, 84d supply, fill #1
  Filled 2021-11-11: qty 9, 84d supply, fill #2

## 2021-02-05 NOTE — Progress Notes (Signed)
Subjective:    Patient ID: Alfred Johnson, male    DOB: [birth year], 50 y.o.   MRN: [id number]  HPI Pt returns for f/u of diabetes mellitus:  DM type: 2 Dx'ed: 2683 Complications: none Therapy: Ozempic and 4 oral meds DKA: never Severe hypoglycemia: never Pancreatitis: never Other: he has never been on insulin; he declines weight loss surgery; he did not tolerate invokana (itching).   Interval history: He says cbg's are in the 100's.  It is in general highest at HS.  He takes meds as rx'ed.  pt states he feels well in general. Past Medical History:  Diagnosis Date   ANEMIA DUE TO CHRONIC BLOOD LOSS 09/10/2009   Diabetes mellitus    Elevated BP    GERD (gastroesophageal reflux disease)    HYPERLIPIDEMIA, MIXED 03/23/2007   Hypertension    KIDNEY STONE 02/05/2009   VENTRICULAR FUNCTION, DECREASED 06/21/2007    No past surgical history on file.  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    Social Determinants of Health   Financial Resource Strain: Not on file  Food Insecurity: Not on file  Transportation Needs: Not on file  Physical Activity: Not on file  Stress: Not on file  Social Connections: Not on file  Intimate Partner Violence: Not on file    Current Outpatient Medications on File Prior to Visit   Medication Sig Dispense Refill   aspirin 81 MG tablet Take 81 mg by mouth daily.     atorvastatin (LIPITOR) 40 MG tablet TAKE 1/2 A TABLET BY MOUTH DAILY. 45 tablet 3   bromocriptine (PARLODEL) 2.5 MG tablet TAKE 1 TABLET BY MOUTH EVERY DAY 90 tablet 3   cetirizine (ZYRTEC) 10 MG tablet Take 10 mg by mouth daily.     glucose blood (ONE TOUCH ULTRA TEST) test strip 1 each by Other route 3 (three) times daily. And lancets 3/day 100 each 11   JARDIANCE 25 MG TABS tablet TAKE 25 MG BY MOUTH DAILY. 90 tablet 3   meloxicam (MOBIC) 7.5 MG tablet TAKE 2 TABLETS (15 MG TOTAL) BY MOUTH DAILY AS NEEDED FOR PAIN. 30 tablet 2   metFORMIN (GLUCOPHAGE-XR) 500 MG 24 hr tablet Take 4 tablets (2,000 mg total) by mouth daily with breakfast. 360 tablet 3   omeprazole (PRILOSEC) 20 MG capsule Take 10 mg by mouth daily.      pioglitazone (ACTOS) 45 MG tablet Take 1 tablet by mouth once daily 90 tablet 0   No current facility-administered medications on file prior to visit.    No Known Allergies  Family History  Problem Relation Age of  Onset   Graves' disease Mother    Heart attack Father 63       COD   Heart failure Father    Kidney disease Father    Diabetes Brother    HIV Brother    Heart attack Maternal Grandfather    Heart attack Paternal Grandfather    Heart disease Brother 66       CABG    BP 128/88 (BP Location: Right Arm, Patient Position: Sitting, Cuff Size: Normal)   Pulse 81   Ht 5\' 10"  (1.778 m)   Wt 276 lb (125.2 kg)   SpO2 97%   BMI 39.60 kg/m    Review of Systems     Objective:   Physical Exam Pulses: dorsalis pedis intact bilat.   MSK: no deformity of the feet CV: no leg edema Skin:  no ulcer on the feet.  normal color and temp on the feet. Neuro: sensation is intact to touch on the feet  A1c=7.5%     Assessment & Plan:  Type 2 DM: uncontrolled  Patient Instructions  I have sent a prescription to your pharmacy, to increase the Ozempic.   Please continue the  same other diabetes medications.   check your blood sugar 3 times a day.  vary the time of day when you check, between before the 3 meals, and at bedtime.  also check if you have symptoms of your blood sugar being too high or too low.  please keep a record of the readings and bring it to your next appointment here (or you can bring the meter itself).  You can write it on any piece of paper.  please call us sooner if your blood sugar goes below 70, or if you have a lot of readings over 200.  Please come back for a follow-up appointment in 3-4 months.

## 2021-02-05 NOTE — Patient Instructions (Addendum)
I have sent a prescription to your pharmacy, to increase the Ozempic.   Please continue the same other diabetes medications.   check your blood sugar 3 times a day.  vary the time of day when you check, between before the 3 meals, and at bedtime.  also check if you have symptoms of your blood sugar being too high or too low.  please keep a record of the readings and bring it to your next appointment here (or you can bring the meter itself).  You can write it on any piece of paper.  please call us sooner if your blood sugar goes below 70, or if you have a lot of readings over 200.  Please come back for a follow-up appointment in 3-4 months.

## 2021-03-08 ENCOUNTER — Other Ambulatory Visit: Payer: Self-pay | Admitting: Endocrinology

## 2021-03-17 NOTE — Progress Notes (Signed)
Cardiology Clinic Note   Patient Name: Alfred Johnson Date of Encounter: 03/18/2021  Primary Care Provider:  Burnis Medin, MD Primary Cardiologist:  Pixie Casino, MD  Patient Profile    Alfred Johnson 50 year old male presents the clinic today for follow-up of his hypertension and hyperlipidemia.  Past Medical History    Past Medical History:  Diagnosis Date   ANEMIA DUE TO CHRONIC BLOOD LOSS 09/10/2009   Diabetes mellitus    Elevated BP    GERD (gastroesophageal reflux disease)    HYPERLIPIDEMIA, MIXED 03/23/2007   Hypertension    KIDNEY STONE 02/05/2009   VENTRICULAR FUNCTION, DECREASED 06/21/2007   History reviewed. No pertinent surgical history.  Allergies  No Known Allergies  History of Present Illness    Alfred Johnson has a PMH of shortness of breath, anemia due to chronic blood loss, diabetes mellitus, hypertension, GERD, hyperlipidemia, kidney stones, and coronary artery disease identified during chest CT.  He initially did not tolerate atorvastatin 40 mg due to myalgias.  He was reduced to 20 mg daily.  He does have family history of coronary artery disease with a brother who had three-vessel bypass.  He was seen by Dr. Debara Pickett on 12/09/2019.  During that time he continued to do well.  He denied episodes of shortness of breath or chest pain.  He had lost about 20 pounds.  He had become more physically active and his hemoglobin A1c 1/21 was 7.1.  His LDL was 68.  His atorvastatin was refilled.  His blood pressure was well controlled at 121/72 and his EKG showed normal sinus rhythm.  He presents the clinic today for follow-up evaluation states he feels well.  He reports that he has gained weight since he was seen last.  He continues to go to the gym around 3 times per week and exercises for about 30 minutes each time.  He reports that his diet is not good/horrible but he does try to choose lower sodium options.  We reviewed his family history of coronary  artery disease and recommendations for aspirin.  We discussed coronary CTA in the future.  I have asked him to increase his physical activity, eat a heart healthy, we will request his labs from his PCP given fall and have him follow-up in 1 year.  Today he denies chest pain, shortness of breath, lower extremity edema, fatigue, palpitations, melena, hematuria, hemoptysis, diaphoresis, weakness, presyncope, syncope, orthopnea, and PND.   Home Medications    Prior to Admission medications   Medication Sig Start Date End Date Taking? Authorizing Provider  aspirin 81 MG tablet Take 81 mg by mouth daily.    [provider]  atorvastatin (LIPITOR) 40 MG tablet TAKE 1/2 A TABLET BY MOUTH DAILY. 12/03/20   Hilty, Nadean Corwin, MD  bromocriptine (PARLODEL) 2.5 MG tablet Take 1 tablet (2.5 mg total) by mouth daily. 03/08/21   Renato Shin, MD  cetirizine (ZYRTEC) 10 MG tablet Take 10 mg by mouth daily.    [provider]  glucose blood (ONE TOUCH ULTRA TEST) test strip 1 each by Other route 3 (three) times daily. And lancets 3/day 08/26/19   Renato Shin, MD  JARDIANCE 25 MG TABS tablet TAKE 25 MG BY MOUTH DAILY. 09/07/20   Renato Shin, MD  meloxicam (MOBIC) 7.5 MG tablet TAKE 2 TABLETS (15 MG TOTAL) BY MOUTH DAILY AS NEEDED FOR PAIN. 01/24/19   Aundra Dubin, PA-C  metFORMIN (GLUCOPHAGE-XR) 500 MG 24 hr tablet Take 4  tablets (2,000 mg total) by mouth daily with breakfast. 09/11/20   Renato Shin, MD  omeprazole (PRILOSEC) 20 MG capsule Take 10 mg by mouth daily.     [provider]  pioglitazone (ACTOS) 45 MG tablet Take 1 tablet by mouth once daily 12/16/20   Renato Shin, MD  Semaglutide, 2 MG/DOSE, (OZEMPIC, 2 MG/DOSE,) 8 MG/3ML SOPN Inject 2 mg into the skin once a week. 02/05/21   Renato Shin, MD    Family History    Family History  Problem Relation Age of Onset   Graves' disease Mother    Heart attack Father 40       COD   Heart failure Father    Kidney disease  Father    Diabetes Brother    HIV Brother    Heart attack Maternal Grandfather    Heart attack Paternal Grandfather    Heart disease Brother 52       CABG   He indicated that his mother is alive. He indicated that his father is deceased. He indicated that his sister is alive. He indicated that only one of his two brothers is alive. He indicated that the status of his maternal grandfather is unknown. He indicated that the status of his paternal grandfather is unknown.  Social History    Social History   Socioeconomic History   Marital status: Married    Spouse name: Not on file   Number of children: Not on file   Years of education: Not on file   Highest education level: Not on file  Occupational History    Employer: SELF-EMPLOYED    Comment: Does not work outside the home  Tobacco Use   Smoking status: Former   Smokeless tobacco: Former    Quit date: 07/28/1992  Substance and Sexual Activity   Alcohol use: No   Drug use: No   Sexual activity: Not on file  Other Topics Concern   Not on file  Social History Narrative   HH of 3   No pets   Firearms locked away   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    Social Determinants of Health   Financial Resource Strain: Not on file  Food Insecurity: Not on file  Transportation Needs: Not on file  Physical Activity: Not on file  Stress: Not on file  Social Connections: Not on file  Intimate Partner Violence: Not on file     Review of Systems    General:  No chills, fever, night sweats or weight changes.  Cardiovascular:  No chest pain, dyspnea on exertion, edema, orthopnea, palpitations, paroxysmal nocturnal dyspnea. Dermatological: No rash, lesions/masses Respiratory: No cough, dyspnea Urologic: No hematuria, dysuria Abdominal:   No nausea, vomiting, diarrhea, bright red blood per rectum, melena, or hematemesis Neurologic:  No visual changes,  wkns, changes in mental status. All other systems reviewed and are otherwise negative except as noted above.  Physical Exam    VS:  BP 118/86   Pulse 90   Resp 20   Ht '5\' 10"'$  (1.778 m)   Wt 273 lb (123.8 kg)   SpO2 96%   BMI 39.17 kg/m  , BMI Body mass index is 39.17 kg/m. GEN: Well nourished, well developed, in no acute distress. HEENT: normal. Neck: Supple, no JVD, carotid bruits, or masses. Cardiac: RRR, no murmurs, rubs, or gallops. No clubbing, cyanosis, edema.  Radials/DP/PT 2+ and equal bilaterally.  Respiratory:  Respirations regular and unlabored, clear to auscultation bilaterally. GI: Soft, nontender, nondistended, BS + x 4. MS: no deformity or atrophy. Skin: warm and dry, no rash. Neuro:  Strength and sensation are intact. Psych: Normal affect.  Accessory Clinical Findings    Recent Labs: No results found for requested labs within last 8760 hours.   Recent Lipid Panel    Component Value Date/Time   CHOL 119 08/19/2019 0909   CHOL 123 03/19/2018 0824   CHOL 96 (L) 05/02/2015 0947   CHOL 175 11/01/2014 1013   TRIG 75.0 08/19/2019 0909   TRIG 57 05/02/2015 0947   TRIG 64 11/01/2014 1013   HDL 36.00 (L) 08/19/2019 0909   HDL 38 (L) 03/19/2018 0824   HDL 36 (L) 05/02/2015 0947   HDL 45 11/01/2014 1013   CHOLHDL 3 08/19/2019 0909   VLDL 15.0 08/19/2019 0909   LDLCALC 68 08/19/2019 0909   LDLCALC 69 03/19/2018 0824   LDLCALC 49 05/02/2015 0947   LDLCALC 117 (H) 11/01/2014 1013   LDLDIRECT 114.5 02/26/2007 1029    ECG personally reviewed by me today-normal sinus rhythm no ST or T wave deviation 90 bpm- No acute changes  EKG 12/09/2019 Normal sinus rhythm no ST or T wave deviation 86 bpm  Assessment & Plan   1.  Essential hypertension-BP today 118/86.  Well-controlled at home. Continue to increase physical activity-goal 150 minutes of moderate physical activity per week Heart healthy low-sodium diet-salty 6 given   Mixed hyperlipidemia-LDL was 68 on  08/19/2019 Continue atorvastatin Heart healthy low-sodium high-fiber diet Maintain physical activity Request labs in fall  Type 2 diabetes-hemoglobin A1c 7.5 on 02/05/2021.  Continues to work on diet and exercise. Jardiance, metformin, Actos, Ozempic Follows with PCP  Disposition: Follow-up with Dr. Debara Pickett in 1 year.  Jossie Ng. Bellamie Turney NP-C    03/18/2021, 9:37 AM [address] Office [phone] Fax [phone]  Notice: This dictation was prepared with Dragon dictation along with smaller phrase technology. Any transcriptional errors that result from this process are unintentional and may not be corrected upon review.  I spent 14 minutes examining this patient, reviewing medications, and using patient centered shared decision making involving her cardiac care.  Prior to her visit I spent greater than 20 minutes reviewing her past medical history,  medications, and prior cardiac tests.

## 2021-03-18 ENCOUNTER — Other Ambulatory Visit: Payer: Self-pay

## 2021-03-18 ENCOUNTER — Ambulatory Visit (INDEPENDENT_AMBULATORY_CARE_PROVIDER_SITE_OTHER): Payer: 59 | Admitting: General Practice

## 2021-03-18 ENCOUNTER — Encounter: Payer: Self-pay | Admitting: General Practice

## 2021-03-18 VITALS — BP 118/86 | HR 90 | Resp 20 | Ht 70.0 in | Wt 273.0 lb

## 2021-03-18 DIAGNOSIS — E782 Mixed hyperlipidemia: Secondary | ICD-10-CM

## 2021-03-18 DIAGNOSIS — I10 Essential (primary) hypertension: Secondary | ICD-10-CM

## 2021-03-18 DIAGNOSIS — E119 Type 2 diabetes mellitus without complications: Secondary | ICD-10-CM

## 2021-03-18 NOTE — Patient Instructions (Signed)
Medication Instructions:  The current medical regimen is effective;  continue present plan and medications as directed. Please refer to the Current Medication list given to you today.  *If you need a refill on your cardiac medications before your next appointment, please call your pharmacy*  Lab Work:    PLEASE HAVE PRIMARY MD SEND RESULTS WHEN DONE IN THE FALL  Special Instructions PLEASE READ AND FOLLOW SALTY 6-ATTACHED-1,'800mg'$  daily  PLEASE INCREASE PHYSICAL ACTIVITY AS TOLERATED, GOAL IS 150 MINUTES OF MODERATE PHYSICAL ACTIVITY WEEKLY.  Follow-Up: Your next appointment:  12 month(s) In Person with You may see Pixie Casino, MD, IF UNAVAILABLE JESSE CLEAVER, FNP-C or one of the following Advanced Practice Providers on your designated Care Team:  Almyra Deforest, PA-C  Fabian Sharp, [place] or  Roby Lofts, PA-C   Please call our office 2 months in advance to schedule this appointment   At [facility], you and your health needs are our priority.  As part of our continuing mission to provide you with exceptional heart care, we have created designated Provider Care Teams.  These Care Teams include your primary Cardiologist (physician) and Advanced Practice Providers (APPs -  Physician Assistants and Nurse Practitioners) who all work together to provide you with the care you need, when you need it.            6 SALTY THINGS TO AVOID     1,'800MG'$  DAILY

## 2021-03-21 ENCOUNTER — Encounter: Payer: Self-pay | Admitting: Physician Assistant

## 2021-03-21 ENCOUNTER — Ambulatory Visit (INDEPENDENT_AMBULATORY_CARE_PROVIDER_SITE_OTHER): Payer: 59 | Admitting: Physician Assistant

## 2021-03-21 ENCOUNTER — Other Ambulatory Visit: Payer: Self-pay

## 2021-03-21 DIAGNOSIS — L720 Epidermal cyst: Secondary | ICD-10-CM

## 2021-03-21 DIAGNOSIS — D485 Neoplasm of uncertain behavior of skin: Secondary | ICD-10-CM

## 2021-03-21 NOTE — Patient Instructions (Signed)

## 2021-04-02 ENCOUNTER — Other Ambulatory Visit: Payer: Self-pay

## 2021-04-02 ENCOUNTER — Ambulatory Visit (INDEPENDENT_AMBULATORY_CARE_PROVIDER_SITE_OTHER): Payer: 59 | Admitting: *Deleted

## 2021-04-02 DIAGNOSIS — Z4802 Encounter for removal of sutures: Secondary | ICD-10-CM

## 2021-04-02 NOTE — Progress Notes (Signed)
Here for suture removal x 2- No sign or symptoms of infection- pathology to patient.

## 2021-04-08 ENCOUNTER — Encounter: Payer: Self-pay | Admitting: Physician Assistant

## 2021-04-08 NOTE — Progress Notes (Signed)
   New Patient   Subjective  Alfred Johnson is a 50 y.o. male who presents for the following: Cyst (Cyst on the left upper back 57m not active, no history of skin cancer or atypical moles).   The following portions of the chart were reviewed this encounter and updated as appropriate:  Tobacco  Allergies  Meds  Problems  Med Hx  Surg Hx  Fam Hx      Objective  Well appearing patient in no apparent distress; mood and affect are within normal limits.  A full examination was performed including scalp, head, eyes, ears, nose, lips, neck, chest, axillae, abdomen, back, buttocks, bilateral upper extremities, bilateral lower extremities, hands, feet, fingers, toes, fingernails, and toenails. All findings within normal limits unless otherwise noted below.  Left Upper Back Dermal nodule      Assessment & Plan  Neoplasm of uncertain behavior of skin Left Upper Back  Skin / nail biopsy Type of biopsy: punch   Informed consent: discussed and consent obtained   Procedure prep:  Patient was prepped and draped in usual sterile fashion (nonsterile) Prep type:  Chlorhexidine Anesthesia: the lesion was anesthetized in a standard fashion   Anesthetic:  1% lidocaine w/ epinephrine 1-100,000 local infiltration Punch size:  8 mm Suture size:  3-0 Suture type: Prolene (polypropylene)   Suture removal (days):  12 Outcome: patient tolerated procedure well   Post-procedure details: wound care instructions given   Post-procedure details comment:  Nonsterile Additional details:  3-0 prolene x 2  Specimen 1 - Surgical pathology Differential Diagnosis: r/o cyst  Check Margins: No     I, Tangy Drozdowski, PA-C, have reviewed all documentation's for this visit.  The documentation on 04/08/21 for the exam, diagnosis, procedures and orders are all accurate and complete.

## 2021-05-11 ENCOUNTER — Other Ambulatory Visit: Payer: Self-pay | Admitting: Endocrinology

## 2021-05-15 ENCOUNTER — Other Ambulatory Visit (HOSPITAL_BASED_OUTPATIENT_CLINIC_OR_DEPARTMENT_OTHER): Payer: Self-pay

## 2021-05-16 ENCOUNTER — Other Ambulatory Visit (HOSPITAL_BASED_OUTPATIENT_CLINIC_OR_DEPARTMENT_OTHER): Payer: Self-pay

## 2021-05-16 MED FILL — Atorvastatin Calcium Tab 40 MG (Base Equivalent): ORAL | 90 days supply | Qty: 45 | Fill #0 | Status: AC

## 2021-05-26 ENCOUNTER — Encounter: Payer: Self-pay | Admitting: Internal Medicine

## 2021-05-26 NOTE — Progress Notes (Signed)
Chief Complaint  Patient presents with   Annual Exam     HPI: Patient  DILLAN CANDELA  50 y.o. comes in today for Preventive Health Care visit  Dm followed by endocrinology Dr Glennon Hamilton . Ozempic  recent increase dose hasnt yet lost weight from this  Not done colonoscopy.  Health Maintenance  Topic Date Due   HIV Screening  Never done   COLONOSCOPY (Pts 45-48yrs Insurance coverage will need to be confirmed)  Never done   COVID-19 Vaccine (2 - Pfizer risk series) 12/21/2020   OPHTHALMOLOGY EXAM  05/15/2021   HEMOGLOBIN A1C  11/24/2021   FOOT EXAM  02/05/2022   URINE MICROALBUMIN  05/27/2022   TETANUS/TDAP  06/07/2025   Pneumococcal Vaccine 35-3 Years old (4 - PPSV23 if available, else PCV20) 03/30/2034   INFLUENZA VACCINE  Completed   Hepatitis C Screening  Completed   Zoster Vaccines- Shingrix  Completed   HPV VACCINES  Aged Out   Health Maintenance Review LIFESTYLE:  Exercise:   moderate   hx of knee surgery  Tobacco/ETS: no Alcohol: minimal  Sugar beverages: Sleep:  t 8 hours  nocturia  Drug use: no HH of 3  cat  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 No pertinent surgical history.  Family History  Problem Relation Age of Onset   Graves' disease Mother    Heart attack Father 29       COD   Heart failure Father    Kidney disease Father    Diabetes Brother    HIV Brother    Heart attack Maternal Grandfather    Heart attack Paternal Grandfather    Heart disease Brother 33       CABG    Social  History   Socioeconomic History   Marital status: Married    Spouse name: Not on file   Number of children: Not on file   Years of education: Not on file   Highest education level: Not on file  Occupational History    Employer: SELF-EMPLOYED    Comment: Does not work outside the home  Tobacco Use   Smoking status: Former   Smokeless tobacco: Former    Quit date: 07/28/1992  Vaping Use   Vaping Use: Never used  Substance and Sexual Activity   Alcohol use: No   Drug use: No   Sexual activity: Not on file  Other Topics Concern   Not on file  Social History Narrative   HH of 3   No pets   Firearms locked away   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    Social Determinants of Health   Financial Resource Strain: Not on file  Food Insecurity: Not on file  Transportation Needs: Not on file  Physical Activity: Not on file  Stress: Not on file  Social Connections: Not on file  Outpatient Medications Prior to Visit  Medication Sig Dispense Refill   aspirin 81 MG tablet Take 81 mg by mouth daily.     atorvastatin (LIPITOR) 40 MG tablet TAKE 1/2 A TABLET BY MOUTH DAILY. 45 tablet 3   bromocriptine (PARLODEL) 2.5 MG tablet Take 1 tablet (2.5 mg total) by mouth daily. 90 tablet 3   cetirizine (ZYRTEC) 10 MG tablet Take 10 mg by mouth daily.     empagliflozin (JARDIANCE) 25 MG TABS tablet TAKE 25 MG BY MOUTH DAILY. 90 tablet 3   glucose blood (ONE TOUCH ULTRA TEST) test strip 1 each by Other route 3 (three) times daily. And lancets 3/day 100 each 11   meloxicam (MOBIC) 7.5 MG tablet TAKE 2 TABLETS (15 MG TOTAL) BY MOUTH DAILY AS NEEDED FOR PAIN. 30 tablet 2   metFORMIN (GLUCOPHAGE-XR) 500 MG 24 hr tablet Take 4 tablets (2,000 mg total) by mouth daily with breakfast. 360 tablet 3   omeprazole (PRILOSEC) 20 MG capsule Take 10 mg by mouth daily.      pioglitazone (ACTOS) 45 MG tablet Take 1 tablet by  mouth once daily 90 tablet 0   Semaglutide, 2 MG/DOSE, (OZEMPIC, 2 MG/DOSE,) 8 MG/3ML SOPN Inject 2 mg into the skin once a week. 9 mL 2   No facility-administered medications prior to visit.     EXAM:  BP (!) 150/88 (BP Location: Left Arm, Patient Position: Sitting, Cuff Size: Normal)   Pulse 90   Temp 97.9 F (36.6 C) (Oral)   Ht 5\' 10"  (1.778 m)   Wt 275 lb 9.6 oz (125 kg)   SpO2 96%   BMI 39.54 kg/m   Body mass index is 39.54 kg/m. Wt Readings from Last 3 Encounters:  05/27/21 275 lb 9.6 oz (125 kg)  03/18/21 273 lb (123.8 kg)  02/05/21 276 lb (125.2 kg)    Physical Exam: Vital signs reviewed LHT:DSKA is a well-developed well-nourished alert cooperative    who appearsr stated age in no acute distress.  HEENT: normocephalic atraumatic , Eyes: PERRL EOM's full, conjunctiva clear, Nares: paten,t no deformity discharge or tenderness., Ears: no deformity EAC's clear TMs with normal landmarks. Mouth: masked  NECK: supple without masses, thyromegaly or bruits. CHEST/PULM:  Clear to auscultation and percussion breath sounds equal no wheeze , rales or rhonchi. No chest wall deformities or tenderness. Breast: normal by inspection . No dimpling, discharge, masses, tenderness or discharge . CV: PMI is nondisplaced, S1 S2 no gallops, murmurs, rubs. Peripheral pulses are full without delay.No JVD .  ABDOMEN: Bowel sounds normal nontender  No guard or rebound, no hepato splenomegal no CVA tenderness.  Extremtities:  No clubbing cyanosis or edema, no acute joint swelling or redness no focal atrophy NEURO:  Oriented x3, cranial nerves 3-12 appear to be intact, no obvious focal weakness,gait within normal limits no abnormal reflexes or asymmetrical SKIN: No acute rashes normal turgor, color, no bruising or petechiae. PSYCH: Oriented, good eye contact, no obvious depression anxiety, cognition and judgment appear normal. LN: no cervical axillary inguinal adenopathy Diabetic Foot Exam -  Simple   Simple Foot Form Visual Inspection No deformities, no ulcerations, no other skin breakdown bilaterally: Yes See comments: Yes Sensation Testing Intact to touch and monofilament testing bilaterally: Yes Pulse Check Posterior Tibialis and Dorsalis pulse intact bilaterally: Yes Comments Left great toe fading bruise  swelling ( dropped on toe  no pain and healing now)      Lab Results  Component Value Date   WBC  7.9 05/27/2021   HGB 15.8 05/27/2021   HCT 48.7 05/27/2021   PLT 273.0 05/27/2021   GLUCOSE 141 (H) 05/27/2021   CHOL 114 05/27/2021   TRIG 55.0 05/27/2021   HDL 42.60 05/27/2021   LDLDIRECT 114.5 02/26/2007   LDLCALC 61 05/27/2021   ALT 21 05/27/2021   AST 13 05/27/2021   NA 141 05/27/2021   K 4.7 05/27/2021   CL 105 05/27/2021   CREATININE 0.84 05/27/2021   BUN 14 05/27/2021   CO2 26 05/27/2021   TSH 1.93 05/27/2021   PSA 0.47 05/27/2021   HGBA1C 7.7 (H) 05/27/2021   MICROALBUR 1.6 05/27/2021    BP Readings from Last 3 Encounters:  05/27/21 (!) 150/88  03/18/21 118/86  02/05/21 128/88    Lab plan  fasting  reviewed with patient   ASSESSMENT AND PLAN:  Discussed the following assessment and plan:    ICD-10-CM   1. Visit for preventive health examination  S28.31 Basic metabolic panel    CBC with Differential/Platelet    Hepatic function panel    Lipid panel    PSA    TSH    Microalbumin / creatinine urine ratio    Hemoglobin A1c    Hemoglobin A1c    Microalbumin / creatinine urine ratio    TSH    PSA    Lipid panel    Hepatic function panel    CBC with Differential/Platelet    Basic metabolic panel    2. Essential hypertension  D17 Basic metabolic panel    CBC with Differential/Platelet    Hepatic function panel    Lipid panel    PSA    TSH    Microalbumin / creatinine urine ratio    Hemoglobin A1c    Hemoglobin A1c    Microalbumin / creatinine urine ratio    TSH    PSA    Lipid panel    Hepatic function panel    CBC  with Differential/Platelet    Basic metabolic panel    3. Prostate cancer screening  O16.0 Basic metabolic panel    CBC with Differential/Platelet    Hepatic function panel    Lipid panel    PSA    TSH    Microalbumin / creatinine urine ratio    Hemoglobin A1c    Hemoglobin A1c    Microalbumin / creatinine urine ratio    TSH    PSA    Lipid panel    Hepatic function panel    CBC with Differential/Platelet    Basic metabolic panel    4. Type 2 diabetes mellitus with hyperglycemia, without long-term current use of insulin (HCC)  V37.10 Basic metabolic panel    CBC with Differential/Platelet    Hepatic function panel    Lipid panel    PSA    TSH    Microalbumin / creatinine urine ratio    Hemoglobin A1c    Hemoglobin A1c    Microalbumin / creatinine urine ratio    TSH    PSA    Lipid panel    Hepatic function panel    CBC with Differential/Platelet    Basic metabolic panel    5. Nocturia  G26.9 Basic metabolic panel    CBC with Differential/Platelet    Hepatic function panel    Lipid panel    PSA    TSH    Microalbumin / creatinine urine ratio    Hemoglobin A1c    Hemoglobin A1c    Microalbumin /  creatinine urine ratio    TSH    PSA    Lipid panel    Hepatic function panel    CBC with Differential/Platelet    Basic metabolic panel    6. HYPERLIPIDEMIA, MIXED  P61.9 Basic metabolic panel    CBC with Differential/Platelet    Hepatic function panel    Lipid panel    PSA    TSH    Microalbumin / creatinine urine ratio    Hemoglobin A1c    Hemoglobin A1c    Microalbumin / creatinine urine ratio    TSH    PSA    Lipid panel    Hepatic function panel    CBC with Differential/Platelet    Basic metabolic panel     Return in about 1 year (around 05/27/2022) for depending on results, cpx with labs.  Patient Care Team: Barret Esquivel, Standley Brooking, MD as PCP - General Hilty, Nadean Corwin, MD as PCP - Cardiology (Cardiology) Renato Shin, MD as Attending Physician  (Internal Medicine) Calvert Cantor, MD (Ophthalmology) Hayden Pedro, MD as Consulting Physician (Ophthalmology) Debara Pickett Nadean Corwin, MD as Consulting Physician (Cardiology) Warren Danes, PA-C as Physician Assistant (Dermatology) Patient Instructions  Good to see you today . Continue lifestyle intervention healthy eating and exercise .  Will share results with Dr Loanne Drilling and team .   Get your colon cancer screening .   IF snoring so loud and stopping breathing  consider check for sleep apnea but sounds like losing weight will help .   Standley Brooking. Aldous Housel M.D.

## 2021-05-27 ENCOUNTER — Other Ambulatory Visit: Payer: Self-pay

## 2021-05-27 ENCOUNTER — Ambulatory Visit (INDEPENDENT_AMBULATORY_CARE_PROVIDER_SITE_OTHER): Payer: 59 | Admitting: Internal Medicine

## 2021-05-27 ENCOUNTER — Encounter: Payer: Self-pay | Admitting: Internal Medicine

## 2021-05-27 VITALS — BP 150/88 | HR 90 | Temp 97.9°F | Ht 70.0 in | Wt 275.6 lb

## 2021-05-27 DIAGNOSIS — Z0000 Encounter for general adult medical examination without abnormal findings: Secondary | ICD-10-CM | POA: Diagnosis not present

## 2021-05-27 DIAGNOSIS — I10 Essential (primary) hypertension: Secondary | ICD-10-CM | POA: Diagnosis not present

## 2021-05-27 DIAGNOSIS — Z125 Encounter for screening for malignant neoplasm of prostate: Secondary | ICD-10-CM

## 2021-05-27 DIAGNOSIS — E782 Mixed hyperlipidemia: Secondary | ICD-10-CM

## 2021-05-27 DIAGNOSIS — E1165 Type 2 diabetes mellitus with hyperglycemia: Secondary | ICD-10-CM | POA: Diagnosis not present

## 2021-05-27 DIAGNOSIS — R351 Nocturia: Secondary | ICD-10-CM

## 2021-05-27 LAB — BASIC METABOLIC PANEL
BUN: 14 mg/dL (ref 6–23)
CO2: 26 mEq/L (ref 19–32)
Calcium: 9.5 mg/dL (ref 8.4–10.5)
Chloride: 105 mEq/L (ref 96–112)
Creatinine, Ser: 0.84 mg/dL (ref 0.40–1.50)
GFR: 100.51 mL/min (ref >60.00)
Glucose, Bld: 141 mg/dL — ABNORMAL HIGH (ref 70–99)
Potassium: 4.7 mEq/L (ref 3.5–5.1)
Sodium: 141 mEq/L (ref 135–145)

## 2021-05-27 LAB — CBC WITH DIFFERENTIAL/PLATELET
Basophils Absolute: 0.1 10*3/uL (ref 0.0–0.1)
Basophils Relative: 1 % (ref 0.0–3.0)
Eosinophils Absolute: 0.2 10*3/uL (ref 0.0–0.7)
Eosinophils Relative: 3.1 % (ref 0.0–5.0)
HCT: 48.7 % (ref 39.0–52.0)
Hemoglobin: 15.8 g/dL (ref 13.0–17.0)
Lymphocytes Relative: 27.6 % (ref 12.0–46.0)
Lymphs Abs: 2.2 10*3/uL (ref 0.7–4.0)
MCHC: 32.4 g/dL (ref 30.0–36.0)
MCV: 89.6 fl (ref 78.0–100.0)
Monocytes Absolute: 0.7 10*3/uL (ref 0.1–1.0)
Monocytes Relative: 8.9 % (ref 3.0–12.0)
Neutro Abs: 4.7 10*3/uL (ref 1.4–7.7)
Neutrophils Relative %: 59.4 % (ref 43.0–77.0)
Platelets: 273 10*3/uL (ref 150.0–400.0)
RBC: 5.43 Mil/uL (ref 4.22–5.81)
RDW: 14.9 % (ref 11.5–15.5)
WBC: 7.9 10*3/uL (ref 4.0–10.5)

## 2021-05-27 LAB — HEPATIC FUNCTION PANEL
ALT: 21 U/L (ref 0–53)
AST: 13 U/L (ref 0–37)
Albumin: 4.5 g/dL (ref 3.5–5.2)
Alkaline Phosphatase: 69 U/L (ref 39–117)
Bilirubin, Direct: 0.1 mg/dL (ref 0.0–0.3)
Total Bilirubin: 0.5 mg/dL (ref 0.2–1.2)
Total Protein: 6.8 g/dL (ref 6.0–8.3)

## 2021-05-27 LAB — LIPID PANEL
Cholesterol: 114 mg/dL (ref 0–200)
HDL: 42.6 mg/dL (ref >39.00)
LDL Cholesterol: 61 mg/dL (ref 0–99)
NonHDL: 71.69
Total CHOL/HDL Ratio: 3
Triglycerides: 55 mg/dL (ref 0.0–149.0)
VLDL: 11 mg/dL (ref 0.0–40.0)

## 2021-05-27 LAB — MICROALBUMIN / CREATININE URINE RATIO
Creatinine,U: 109.7 mg/dL
Microalb Creat Ratio: 1.5 mg/g (ref 0.0–30.0)
Microalb, Ur: 1.6 mg/dL (ref 0.0–1.9)

## 2021-05-27 LAB — PSA: PSA: 0.47 ng/mL (ref 0.10–4.00)

## 2021-05-27 LAB — HEMOGLOBIN A1C: Hgb A1c MFr Bld: 7.7 % — ABNORMAL HIGH (ref 4.6–6.5)

## 2021-05-27 LAB — TSH: TSH: 1.93 u[IU]/mL (ref 0.35–5.50)

## 2021-05-27 NOTE — Patient Instructions (Addendum)
Good to see you today . Continue lifestyle intervention healthy eating and exercise .  Will share results with Dr Loanne Drilling and team .   Get your colon cancer screening .   IF snoring so loud and stopping breathing  consider check for sleep apnea but sounds like losing weight will help .

## 2021-05-30 ENCOUNTER — Ambulatory Visit: Payer: 59 | Attending: Internal Medicine

## 2021-05-30 ENCOUNTER — Other Ambulatory Visit (HOSPITAL_BASED_OUTPATIENT_CLINIC_OR_DEPARTMENT_OTHER): Payer: Self-pay

## 2021-05-30 DIAGNOSIS — Z23 Encounter for immunization: Secondary | ICD-10-CM

## 2021-05-30 MED ORDER — PFIZER COVID-19 VAC BIVALENT 30 MCG/0.3ML IM SUSP
INTRAMUSCULAR | 0 refills | Status: DC
  Filled 2021-05-30: qty 0.3, 1d supply, fill #0

## 2021-05-30 NOTE — Progress Notes (Signed)
   Covid-19 Vaccination Clinic  Name:  KUPER RENNELS    MRN: [id number] DOB: August 06, 1970  05/30/2021  Mr. Donaldson was observed post Covid-19 immunization for 15 minutes without incident. He was provided with Vaccine Information Sheet and instruction to access the V-Safe system.   Mr. Aspinall was instructed to call 911 with any severe reactions post vaccine: Difficulty breathing  Swelling of face and throat  A fast heartbeat  A bad rash all over body  Dizziness and weakness   Immunizations Administered     Name Date Dose VIS Date Route   Pfizer Covid-19 Vaccine Bivalent Booster 05/30/2021  9:49 AM 0.3 mL 03/27/2021 Intramuscular   Manufacturer: [place]   Lot: [id number]   [place]: [phone]

## 2021-06-02 NOTE — Progress Notes (Signed)
Results in  range or at goal except a1c 7.7  forwarding to Dr Loanne Drilling also

## 2021-06-05 ENCOUNTER — Ambulatory Visit (INDEPENDENT_AMBULATORY_CARE_PROVIDER_SITE_OTHER): Payer: 59 | Admitting: Endocrinology

## 2021-06-05 ENCOUNTER — Other Ambulatory Visit: Payer: Self-pay

## 2021-06-05 VITALS — BP 130/78 | HR 83 | Ht 70.0 in | Wt 268.8 lb

## 2021-06-05 DIAGNOSIS — E119 Type 2 diabetes mellitus without complications: Secondary | ICD-10-CM

## 2021-06-05 LAB — POCT GLYCOSYLATED HEMOGLOBIN (HGB A1C): Hemoglobin A1C: 7.2 % — AB (ref 4.0–5.6)

## 2021-06-05 MED ORDER — BROMOCRIPTINE MESYLATE 5 MG PO CAPS: 5.0000 mg | ORAL_CAPSULE | Freq: Every day | ORAL | 3 refills | Status: DC

## 2021-06-05 MED ORDER — OZEMPIC (2 MG/DOSE) 8 MG/3ML ~~LOC~~ SOPN: 2.0000 mg | PEN_INJECTOR | SUBCUTANEOUS | 2 refills | Status: DC

## 2021-06-05 NOTE — Progress Notes (Signed)
Subjective:    Patient ID: Alfred Johnson, male    DOB: [birth year], 50 y.o.   MRN: [id number]  HPI Pt returns for f/u of diabetes mellitus:  DM type: 2 Dx'ed: 9030 Complications: none Therapy: Ozempic and 4 oral meds DKA: never Severe hypoglycemia: never Pancreatitis: never Other: he has never been on insulin; he declines weight loss surgery; he did not tolerate invokana (itching).   Interval history: He says cbg's are in the 100's.  It is in general highest at HS.  He takes meds as rx'ed.  pt states he feels well in general.  Past Medical History:  Diagnosis Date   Acute medial meniscus tear of right knee 01/05/2018   ANEMIA DUE TO CHRONIC BLOOD LOSS 09/10/2009   Diabetes mellitus    Elevated BP    GERD (gastroesophageal reflux disease)    HYPERLIPIDEMIA, MIXED 03/23/2007   Hypertension    KIDNEY STONE 02/05/2009   Left lower lobe pneumonia 02/08/2014   VENTRICULAR FUNCTION, DECREASED 06/21/2007    No past surgical history on file.  Social History   Socioeconomic History   Marital status: Married    Spouse name: Not on file   Number of children: Not on file   Years of education: Not on file   Highest education level: Not on file  Occupational History    Employer: SELF-EMPLOYED    Comment: Does not work outside the home  Tobacco Use   Smoking status: Former   Smokeless tobacco: Former    Quit date: 07/28/1992  Vaping Use   Vaping Use: Never used  Substance and Sexual Activity   Alcohol use: No   Drug use: No   Sexual activity: Not on file  Other Topics Concern   Not on file  Social History Narrative   HH of 3   No pets   Firearms locked away   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    Social Determinants of Health   Financial Resource Strain: Not on file  Food Insecurity: Not on file  Transportation Needs: Not on file  Physical Activity: Not on file  Stress: Not on file   Social Connections: Not on file  Intimate Partner Violence: Not on file    Current Outpatient Medications on File Prior to Visit  Medication Sig Dispense Refill   aspirin 81 MG tablet Take 81 mg by mouth daily.     atorvastatin (LIPITOR) 40 MG tablet TAKE 1/2 A TABLET BY MOUTH DAILY. 45 tablet 3   cetirizine (ZYRTEC) 10 MG tablet Take 10 mg by mouth daily.     COVID-19 mRNA bivalent vaccine, Pfizer, (PFIZER COVID-19 VAC BIVALENT) injection Inject into the muscle. 0.3 mL 0   empagliflozin (JARDIANCE) 25 MG TABS tablet TAKE 25 MG BY MOUTH DAILY. 90 tablet 3   glucose blood (ONE TOUCH ULTRA TEST) test strip 1 each by Other route 3 (three) times daily. And lancets 3/day 100 each 11   meloxicam (MOBIC) 7.5 MG tablet TAKE 2 TABLETS (15 MG TOTAL) BY MOUTH DAILY AS NEEDED FOR PAIN. 30 tablet 2   metFORMIN (GLUCOPHAGE-XR) 500 MG 24 hr tablet Take 4 tablets (2,000 mg total) by mouth daily with breakfast. 360 tablet 3   omeprazole (PRILOSEC) 20 MG capsule Take 10 mg by mouth daily.      pioglitazone (ACTOS) 45 MG tablet Take 1 tablet by mouth  once daily 90 tablet 0   No current facility-administered medications on file prior to visit.    No Known Allergies  Family History  Problem Relation Age of Onset   Graves' disease Mother    Heart attack Father 56       COD   Heart failure Father    Kidney disease Father    Diabetes Brother    HIV Brother    Heart attack Maternal Grandfather    Heart attack Paternal Grandfather    Heart disease Brother 9       CABG    BP 130/78 (BP Location: Right Arm, Patient Position: Sitting, Cuff Size: Large)   Pulse 83   Ht 5\' 10"  (1.778 m)   Wt 268 lb 12.8 oz (121.9 kg)   SpO2 94%   BMI 38.57 kg/m    Review of Systems     Objective:   Physical Exam   Lab Results  Component Value Date   HGBA1C 7.2 (A) 06/05/2021       Assessment & Plan:  Type 2 DM: uncontrolled.    Patient Instructions  I have sent a prescription to your pharmacy, to  increase the bromocriptine.   Please continue the same other diabetes medications.   check your blood sugar 3 times a day.  vary the time of day when you check, between before the 3 meals, and at bedtime.  also check if you have symptoms of your blood sugar being too high or too low.  please keep a record of the readings and bring it to your next appointment here (or you can bring the meter itself).  You can write it on any piece of paper.  please call us sooner if your blood sugar goes below 70, or if you have a lot of readings over 200.   Please come back for a follow-up appointment in 3-4 months.

## 2021-06-05 NOTE — Patient Instructions (Signed)
I have sent a prescription to your pharmacy, to increase the bromocriptine.   Please continue the same other diabetes medications.   check your blood sugar 3 times a day.  vary the time of day when you check, between before the 3 meals, and at bedtime.  also check if you have symptoms of your blood sugar being too high or too low.  please keep a record of the readings and bring it to your next appointment here (or you can bring the meter itself).  You can write it on any piece of paper.  please call us sooner if your blood sugar goes below 70, or if you have a lot of readings over 200.   Please come back for a follow-up appointment in 3-4 months.

## 2021-08-05 ENCOUNTER — Other Ambulatory Visit (HOSPITAL_BASED_OUTPATIENT_CLINIC_OR_DEPARTMENT_OTHER): Payer: Self-pay

## 2021-08-05 ENCOUNTER — Other Ambulatory Visit: Payer: Self-pay | Admitting: Endocrinology

## 2021-08-05 MED FILL — Empagliflozin Tab 25 MG: ORAL | 90 days supply | Qty: 90 | Fill #0 | Status: AC

## 2021-08-08 ENCOUNTER — Other Ambulatory Visit (HOSPITAL_BASED_OUTPATIENT_CLINIC_OR_DEPARTMENT_OTHER): Payer: Self-pay

## 2021-09-09 ENCOUNTER — Other Ambulatory Visit: Payer: Self-pay

## 2021-09-09 ENCOUNTER — Other Ambulatory Visit (HOSPITAL_BASED_OUTPATIENT_CLINIC_OR_DEPARTMENT_OTHER): Payer: Self-pay

## 2021-09-09 ENCOUNTER — Ambulatory Visit (INDEPENDENT_AMBULATORY_CARE_PROVIDER_SITE_OTHER): Payer: 59 | Admitting: Gastroenterology

## 2021-09-09 ENCOUNTER — Other Ambulatory Visit (HOSPITAL_COMMUNITY): Payer: Self-pay

## 2021-09-09 ENCOUNTER — Telehealth: Payer: Self-pay

## 2021-09-09 ENCOUNTER — Encounter: Payer: Self-pay | Admitting: Gastroenterology

## 2021-09-09 VITALS — BP 123/86 | HR 105 | Temp 97.9°F | Ht 70.0 in | Wt 272.2 lb

## 2021-09-09 DIAGNOSIS — Z1211 Encounter for screening for malignant neoplasm of colon: Secondary | ICD-10-CM | POA: Diagnosis not present

## 2021-09-09 DIAGNOSIS — R142 Eructation: Secondary | ICD-10-CM

## 2021-09-09 DIAGNOSIS — K219 Gastro-esophageal reflux disease without esophagitis: Secondary | ICD-10-CM | POA: Diagnosis not present

## 2021-09-09 MED ORDER — NA SULFATE-K SULFATE-MG SULF 17.5-3.13-1.6 GM/177ML PO SOLN
1.0000 | Freq: Once | ORAL | 0 refills | Status: AC
Start: 2021-09-09 — End: 2021-09-12
  Filled 2021-09-09: qty 354, 1d supply, fill #0

## 2021-09-09 MED ORDER — OMEPRAZOLE 40 MG PO CPDR: 40.0000 mg | DELAYED_RELEASE_CAPSULE | Freq: Every day | ORAL | 0 refills | Status: DC

## 2021-09-09 NOTE — Progress Notes (Signed)
Cephas Darby, MD [address]  [place], [place] [address]  Main: [phone]  Fax: [phone]    Gastroenterology Consultation  Referring Provider:     Burnis Medin, MD Primary Care Physician:  Burnis Medin, MD Primary Gastroenterologist:  Dr. Cephas Darby Reason for Consultation:     Rancid belching        HPI:   Alfred Johnson is a 51 y.o. male referred by Dr. Regis Bill, Standley Brooking, MD  for consultation & management of several months history of rancid belches that have been occurring frequently.  He also reports intermittent, sporadic episodes of nausea and vomiting during a meal with no particular relation to the type of food.  He has history of chronic GERD since his 35s, was taking over-the-counter antacids as needed.  He started taking omeprazole 10 mg once a day, later switched to 20 mg once a day about 8 months ago.  He denies typical heartburn, regurgitation, epigastric pain.  He denies abdominal bloating.  He has history of diabetes, hemoglobin A1c around 7.  Patient also reports variable stool consistency anywhere from formed to loose mushy and 3-4 daily.  He denies any abdominal pain or rectal bleeding.  Patient has cut back on carbonated beverages.  He consumes red meat 3 times a week.  His weight has been stable.  He started swimming.  He is a Cabin crew. He denies any GI surgeries NSAIDs: None  Antiplts/Anticoagulants/Anti thrombotics: None  GI Procedures: None No known family history of GI malignancy  Past Medical History:  Diagnosis Date   Acute medial meniscus tear of right knee 01/05/2018   ANEMIA DUE TO CHRONIC BLOOD LOSS 09/10/2009   Diabetes mellitus    Elevated BP    GERD (gastroesophageal reflux disease)    HYPERLIPIDEMIA, MIXED 03/23/2007   Hypertension    KIDNEY STONE 02/05/2009   Left lower lobe pneumonia 02/08/2014   VENTRICULAR FUNCTION, DECREASED 06/21/2007    History reviewed. No pertinent surgical history.  Current  Outpatient Medications:    aspirin 81 MG tablet, Take 81 mg by mouth daily., Disp: , Rfl:    atorvastatin (LIPITOR) 40 MG tablet, TAKE 1/2 A TABLET BY MOUTH DAILY., Disp: 45 tablet, Rfl: 3   bromocriptine (PARLODEL) 5 MG capsule, Take 1 capsule (5 mg total) by mouth daily., Disp: 90 capsule, Rfl: 3   cetirizine (ZYRTEC) 10 MG tablet, Take 10 mg by mouth daily., Disp: , Rfl:    COVID-19 mRNA bivalent vaccine, Pfizer, (PFIZER COVID-19 VAC BIVALENT) injection, Inject into the muscle., Disp: 0.3 mL, Rfl: 0   empagliflozin (JARDIANCE) 25 MG TABS tablet, TAKE 25 MG BY MOUTH DAILY., Disp: 90 tablet, Rfl: 3   glucose blood (ONE TOUCH ULTRA TEST) test strip, 1 each by Other route 3 (three) times daily. And lancets 3/day, Disp: 100 each, Rfl: 11   metFORMIN (GLUCOPHAGE-XR) 500 MG 24 hr tablet, Take 4 tablets (2,000 mg total) by mouth daily with breakfast., Disp: 360 tablet, Rfl: 3   Na Sulfate-K Sulfate-Mg Sulf 17.5-3.13-1.6 GM/177ML SOLN, Take 1 kit by mouth once for 1 dose., Disp: 354 mL, Rfl: 0   omeprazole (PRILOSEC) 20 MG capsule, Take 10 mg by mouth daily. , Disp: , Rfl:    pioglitazone (ACTOS) 45 MG tablet, Take 1 tablet by mouth once daily, Disp: 90 tablet, Rfl: 3   Semaglutide, 2 MG/DOSE, (OZEMPIC, 2 MG/DOSE,) 8 MG/3ML SOPN, Inject 2 mg into the skin once a week., Disp: 9  mL, Rfl: 2   Semaglutide, 2 MG/DOSE, (OZEMPIC, 2 MG/DOSE,) 8 MG/3ML SOPN, Inject 2 mg into the skin once a week., Disp: 9 mL, Rfl: 2   meloxicam (MOBIC) 7.5 MG tablet, TAKE 2 TABLETS (15 MG TOTAL) BY MOUTH DAILY AS NEEDED FOR PAIN., Disp: 30 tablet, Rfl: 2   Semaglutide, 2 MG/DOSE, (OZEMPIC, 2 MG/DOSE,) 8 MG/3ML SOPN, Inject 2 mg into the skin once a week., Disp: 9 mL, Rfl: 2  Family History  Problem Relation Age of Onset   Graves' disease Mother    Heart attack Father 73       COD   Heart failure Father    Kidney disease Father    Diabetes Brother    HIV Brother    Heart attack Maternal Grandfather    Heart attack  Paternal Grandfather    Heart disease Brother 65       CABG     Social History   Tobacco Use   Smoking status: Former   Smokeless tobacco: Former    Quit date: 07/28/1992  Vaping Use   Vaping Use: Never used  Substance Use Topics   Alcohol use: No   Drug use: No    Allergies as of 09/09/2021   (No Known Allergies)    Review of Systems:    All systems reviewed and negative except where noted in HPI.   Physical Exam:  BP 123/86 (BP Location: Right Arm, Patient Position: Sitting, Cuff Size: Large)    Pulse (!) 105    Temp 97.9 F (36.6 C) (Oral)    Ht 5' 10"  (1.778 m)    Wt 272 lb 3.2 oz (123.5 kg)    BMI 39.06 kg/m  No LMP for male patient.  General:   Alert,  Well-developed, well-nourished, pleasant and cooperative in NAD Head:  Normocephalic and atraumatic. Eyes:  Sclera clear, no icterus.   Conjunctiva pink. Ears:  Normal auditory acuity. Nose:  No deformity, discharge, or lesions. Mouth:  No deformity or lesions,oropharynx pink & moist. Neck:  Supple; no masses or thyromegaly. Lungs:  Respirations even and unlabored.  Clear throughout to auscultation.   No wheezes, crackles, or rhonchi. No acute distress. Heart:  Regular rate and rhythm; no murmurs, clicks, rubs, or gallops. Abdomen:  Normal bowel sounds. Soft, non-tender and non-distended without masses, hepatosplenomegaly or hernias noted.  No guarding or rebound tenderness.   Rectal: Not performed Msk:  Symmetrical without gross deformities. Good, equal movement & strength bilaterally. Pulses:  Normal pulses noted. Extremities:  No clubbing or edema.  No cyanosis. Neurologic:  Alert and oriented x3;  grossly normal neurologically. Skin:  Intact without significant lesions or rashes. No jaundice. Psych:  Alert and cooperative. Normal mood and affect.  Imaging Studies: No abdominal imaging  Assessment and Plan:   Alfred Johnson is a 51 y.o. pleasant Caucasian male with metabolic syndrome, chronic GERD is seen  in consultation for frequent rancid belching and intermittent episodes of nausea and vomiting  Frequent acid belching, nausea and vomiting Suspect patient may have diabetic gastroparesis Recommend EGD for further evaluation Increase omeprazole to 40 mg once daily before meals for 1 month Discussed about cut back on red meat from 3 times a week to once every 2 weeks Recommend gastric emptying study if EGD is unremarkable  Colon cancer screening Recommend colonoscopy Patient complaining of loose stools, recommend random colon biopsies   Follow up after above work-up   Cephas Darby, MD

## 2021-09-09 NOTE — Telephone Encounter (Signed)
Patient Advocate Encounter   Received notification from [facility] that prior authorization for Ozempic 8mg /83ml pen injectors is required by his/her insurance [employer].   PA submitted on 09/09/21  Key#: [id number]  Status is pending    [facility] will continue to follow:  Patient Advocate Fax: [phone]

## 2021-09-10 ENCOUNTER — Other Ambulatory Visit (HOSPITAL_COMMUNITY): Payer: Self-pay

## 2021-09-10 NOTE — Telephone Encounter (Signed)
Patient Advocate Encounter  Prior Authorization for Ozempic 8mg /28ml pen injectors has been approved.    PA# [id number]  Effective dates: 09/09/21 through 09/08/24  Refill too soon  Patient Advocate Fax: [phone]

## 2021-09-11 ENCOUNTER — Other Ambulatory Visit (HOSPITAL_BASED_OUTPATIENT_CLINIC_OR_DEPARTMENT_OTHER): Payer: Self-pay

## 2021-09-11 MED ORDER — OMEPRAZOLE 40 MG PO CPDR
DELAYED_RELEASE_CAPSULE | ORAL | 0 refills | Status: DC
  Filled 2021-09-11: qty 30, 30d supply, fill #0

## 2021-09-23 ENCOUNTER — Other Ambulatory Visit (HOSPITAL_BASED_OUTPATIENT_CLINIC_OR_DEPARTMENT_OTHER): Payer: Self-pay

## 2021-09-23 ENCOUNTER — Other Ambulatory Visit: Payer: Self-pay | Admitting: Endocrinology

## 2021-09-23 MED FILL — Atorvastatin Calcium Tab 40 MG (Base Equivalent): ORAL | 90 days supply | Qty: 45 | Fill #1 | Status: AC

## 2021-09-26 ENCOUNTER — Other Ambulatory Visit: Payer: Self-pay

## 2021-09-26 ENCOUNTER — Other Ambulatory Visit (HOSPITAL_BASED_OUTPATIENT_CLINIC_OR_DEPARTMENT_OTHER): Payer: Self-pay

## 2021-09-26 ENCOUNTER — Encounter (HOSPITAL_BASED_OUTPATIENT_CLINIC_OR_DEPARTMENT_OTHER): Payer: Self-pay

## 2021-09-26 DIAGNOSIS — E119 Type 2 diabetes mellitus without complications: Secondary | ICD-10-CM

## 2021-09-26 MED ORDER — BROMOCRIPTINE MESYLATE 5 MG PO CAPS
5.0000 mg | ORAL_CAPSULE | Freq: Every day | ORAL | 3 refills | Status: DC
  Filled 2021-09-27: qty 90, 90d supply, fill #0

## 2021-09-27 ENCOUNTER — Other Ambulatory Visit (HOSPITAL_BASED_OUTPATIENT_CLINIC_OR_DEPARTMENT_OTHER): Payer: Self-pay

## 2021-09-30 ENCOUNTER — Other Ambulatory Visit (HOSPITAL_BASED_OUTPATIENT_CLINIC_OR_DEPARTMENT_OTHER): Payer: Self-pay

## 2021-10-01 ENCOUNTER — Other Ambulatory Visit (HOSPITAL_BASED_OUTPATIENT_CLINIC_OR_DEPARTMENT_OTHER): Payer: Self-pay

## 2021-10-02 ENCOUNTER — Encounter: Payer: Self-pay | Admitting: Gastroenterology

## 2021-10-02 ENCOUNTER — Ambulatory Visit: Payer: 59 | Admitting: Anesthesiology

## 2021-10-02 ENCOUNTER — Other Ambulatory Visit: Payer: Self-pay | Admitting: Gastroenterology

## 2021-10-02 ENCOUNTER — Encounter
Admission: RE | Disposition: A | Discharge status: Home / Self Care | Payer: Self-pay | Source: Home / Self Care | Attending: Gastroenterology

## 2021-10-02 ENCOUNTER — Ambulatory Visit
Admission: RE | Admit: 2021-10-02 | Discharge: 2021-10-02 | Disposition: A | Discharge status: Home / Self Care | Payer: 59 | Attending: Gastroenterology | Admitting: Gastroenterology

## 2021-10-02 ENCOUNTER — Other Ambulatory Visit (HOSPITAL_BASED_OUTPATIENT_CLINIC_OR_DEPARTMENT_OTHER): Payer: Self-pay

## 2021-10-02 DIAGNOSIS — K5730 Diverticulosis of large intestine without perforation or abscess without bleeding: Secondary | ICD-10-CM | POA: Diagnosis not present

## 2021-10-02 DIAGNOSIS — K449 Diaphragmatic hernia without obstruction or gangrene: Secondary | ICD-10-CM | POA: Insufficient documentation

## 2021-10-02 DIAGNOSIS — R142 Eructation: Secondary | ICD-10-CM

## 2021-10-02 DIAGNOSIS — K219 Gastro-esophageal reflux disease without esophagitis: Secondary | ICD-10-CM | POA: Diagnosis not present

## 2021-10-02 DIAGNOSIS — Z1211 Encounter for screening for malignant neoplasm of colon: Secondary | ICD-10-CM | POA: Diagnosis present

## 2021-10-02 DIAGNOSIS — K635 Polyp of colon: Secondary | ICD-10-CM

## 2021-10-02 DIAGNOSIS — E782 Mixed hyperlipidemia: Secondary | ICD-10-CM | POA: Diagnosis not present

## 2021-10-02 DIAGNOSIS — E119 Type 2 diabetes mellitus without complications: Secondary | ICD-10-CM | POA: Diagnosis not present

## 2021-10-02 DIAGNOSIS — D124 Benign neoplasm of descending colon: Secondary | ICD-10-CM | POA: Insufficient documentation

## 2021-10-02 DIAGNOSIS — Z87891 Personal history of nicotine dependence: Secondary | ICD-10-CM | POA: Diagnosis not present

## 2021-10-02 DIAGNOSIS — G4730 Sleep apnea, unspecified: Secondary | ICD-10-CM | POA: Diagnosis not present

## 2021-10-02 DIAGNOSIS — K644 Residual hemorrhoidal skin tags: Secondary | ICD-10-CM | POA: Insufficient documentation

## 2021-10-02 HISTORY — PX: COLONOSCOPY WITH PROPOFOL: SHX5780

## 2021-10-02 HISTORY — PX: ESOPHAGOGASTRODUODENOSCOPY: SHX5428

## 2021-10-02 SURGERY — COLONOSCOPY WITH PROPOFOL
Anesthesia: General

## 2021-10-02 MED ORDER — LIDOCAINE HCL (CARDIAC) PF 100 MG/5ML IV SOSY
PREFILLED_SYRINGE | INTRAVENOUS | Status: DC | PRN
  Administered 2021-10-02: 100 mg via INTRAVENOUS

## 2021-10-02 MED ORDER — PROPOFOL 10 MG/ML IV BOLUS
INTRAVENOUS | Status: AC
  Filled 2021-10-02: qty 20

## 2021-10-02 MED ORDER — OMEPRAZOLE 40 MG PO CPDR
40.0000 mg | DELAYED_RELEASE_CAPSULE | Freq: Every day | ORAL | 0 refills | Status: DC
  Filled 2021-10-02 – 2021-10-07 (×2): qty 60, 60d supply, fill #0
  Filled 2021-12-18: qty 30, 30d supply, fill #1

## 2021-10-02 MED ORDER — PROPOFOL 10 MG/ML IV BOLUS
INTRAVENOUS | Status: DC | PRN
  Administered 2021-10-02 (×2): 100 mg via INTRAVENOUS

## 2021-10-02 MED ORDER — SODIUM CHLORIDE 0.9 % IV SOLN
INTRAVENOUS | Status: DC
  Administered 2021-10-02: 20 mL/h via INTRAVENOUS

## 2021-10-02 MED ORDER — PROPOFOL 500 MG/50ML IV EMUL
INTRAVENOUS | Status: DC | PRN
Start: 2021-10-02 — End: 2021-10-02
  Administered 2021-10-02: 175 ug/kg/min via INTRAVENOUS

## 2021-10-02 MED ORDER — PHENYLEPHRINE HCL-NACL 20-0.9 MG/250ML-% IV SOLN
INTRAVENOUS | Status: AC
  Filled 2021-10-02: qty 250

## 2021-10-02 MED ORDER — PROPOFOL 500 MG/50ML IV EMUL
INTRAVENOUS | Status: AC
  Filled 2021-10-02: qty 50

## 2021-10-02 NOTE — Op Note (Signed)
[facility] ?Gastroenterology ?Patient Name: Alfred Johnson ?Procedure Date: 10/02/2021 7:06 AM ?MRN: [id number] ?Account #: [id number] ?Date of Birth: 18-Jun-1971 ?Admit Type: Outpatient ?Age: 51 ?Room: [facility] ENDO ROOM 4 ?Gender: Male ?Note Status: Finalized ?Instrument Name: Upper Endoscope [id number] ?Procedure:             Upper GI endoscopy ?Indications:           Screening for Barrett's esophagus in patient at risk  ?                       for this condition, Esophageal reflux symptoms that  ?                       persist despite appropriate therapy ?Providers:             Lin Landsman MD, MD ?Referring MD:          Shanon Ace, MD (Referring MD) ?Medicines:             General Anesthesia ?Complications:         No immediate complications. Estimated blood loss: None. ?Procedure:             Pre-Anesthesia Assessment: ?                       - Prior to the procedure, a History and Physical was  ?                       performed, and patient medications and allergies were  ?                       reviewed. The patient is competent. The risks and  ?                       benefits of the procedure and the sedation options and  ?                       risks were discussed with the patient. All questions  ?                       were answered and informed consent was obtained.  ?                       Patient identification and proposed procedure were  ?                       verified by the physician, the nurse, the  ?                       anesthesiologist, the anesthetist and the technician  ?                       in the pre-procedure area in the procedure room in the  ?                       endoscopy suite. Mental Status Examination: alert and  ?                       oriented. Airway Examination: normal oropharyngeal  ?  airway and neck mobility. Respiratory Examination:  ?                       clear to auscultation. CV Examination: normal.  ?                        Prophylactic Antibiotics: The patient does not require  ?                       prophylactic antibiotics. Prior Anticoagulants: The  ?                       patient has taken no previous anticoagulant or  ?                       antiplatelet agents. ASA Grade Assessment: III - A  ?                       patient with severe systemic disease. After reviewing  ?                       the risks and benefits, the patient was deemed in  ?                       satisfactory condition to undergo the procedure. The  ?                       anesthesia plan was to use general anesthesia.  ?                       Immediately prior to administration of medications,  ?                       the patient was re-assessed for adequacy to receive  ?                       sedatives. The heart rate, respiratory rate, oxygen  ?                       saturations, blood pressure, adequacy of pulmonary  ?                       ventilation, and response to care were monitored  ?                       throughout the procedure. The physical status of the  ?                       patient was re-assessed after the procedure. ?                       After obtaining informed consent, the endoscope was  ?                       passed under direct vision. Throughout the procedure,  ?                       the patient's blood pressure, pulse, and oxygen  ?  saturations were monitored continuously. The Endoscope  ?                       was introduced through the mouth, and advanced to the  ?                       second part of duodenum. The upper GI endoscopy was  ?                       accomplished without difficulty. The patient tolerated  ?                       the procedure well. ?Findings: ?     The duodenal bulb and second portion of the duodenum were normal. ?     A small hiatal hernia was present. ?     The entire examined stomach was normal. ?     The cardia and gastric fundus were normal on retroflexion. ?     The  gastroesophageal junction and examined esophagus were normal. ?Impression:            - Normal duodenal bulb and second portion of the  ?                       duodenum. ?                       - Small hiatal hernia. ?                       - Normal stomach. ?                       - Normal gastroesophageal junction and esophagus. ?                       - No specimens collected. ?Recommendation:        - Follow an antireflux regimen indefinitely. ?                       - Use Prilosec (omeprazole) 40 mg PO daily for 3  ?                       months. ?                       - Proceed with colonoscopy as scheduled ?                       See colonoscopy report ?Procedure Code(s):     --- Professional --- ?                       [phone], Esophagogastroduodenoscopy, flexible,  ?                       transoral; diagnostic, including collection of  ?                       specimen(s) by brushing or washing, when performed  ?                       (separate procedure) ?Diagnosis  Code(s):     --- Professional --- ?                       B04.888, Encounter for screening for upper  ?                       gastrointestinal disorder ?                       K44.9, Diaphragmatic hernia without obstruction or  ?                       gangrene ?                       K21.9, Gastro-esophageal reflux disease without  ?                       esophagitis ?CPT copyright 2019 American Medical Association. All rights reserved. ?The codes documented in this report are preliminary and upon coder review may  ?be revised to meet current compliance requirements. ?Dr. Ulyess Mort ?Anavi Branscum Raeanne Gathers MD, MD ?10/02/2021 8:23:47 AM ?This report has been signed electronically. ?Number of Addenda: 0 ?Note Initiated On: 10/02/2021 7:06 AM ?Estimated Blood Loss:  Estimated blood loss: none. ?     [facility] ?

## 2021-10-02 NOTE — Anesthesia Preprocedure Evaluation (Signed)
Anesthesia Evaluation  ?Patient identified by MRN, date of birth, ID band ?Patient awake ? ? ? ?Reviewed: ?Allergy & Precautions, NPO status , Patient's Chart, lab work & pertinent test results ? ?History of Anesthesia Complications ?Negative for: history of anesthetic complications ? ?Airway ?Mallampati: III ? ?TM Distance: <3 FB ?Neck ROM: full ? ? ? Dental ? ?(+) Chipped ?  ?Pulmonary ?neg shortness of breath, sleep apnea , former smoker,  ?  ?Pulmonary exam normal ? ? ? ? ? ? ? Cardiovascular ?Exercise Tolerance: Good ?hypertension, (-) angina(-) Past MI Normal cardiovascular exam ? ? ?  ?Neuro/Psych ?negative neurological ROS ? negative psych ROS  ? GI/Hepatic ?Neg liver ROS, GERD  Controlled,  ?Endo/Other  ?diabetes, Type 2 ? Renal/GU ?negative Renal ROS  ?negative genitourinary ?  ?Musculoskeletal ? ? Abdominal ?  ?Peds ? Hematology ?negative hematology ROS ?(+)   ?Anesthesia Other Findings ?Past Medical History: ?01/05/2018: Acute medial meniscus tear of right knee ?09/10/2009: ANEMIA DUE TO CHRONIC BLOOD LOSS ?No date: Diabetes mellitus ?No date: Elevated BP ?No date: GERD (gastroesophageal reflux disease) ?03/23/2007: HYPERLIPIDEMIA, MIXED ?No date: Hypertension ?02/05/2009: KIDNEY STONE ?02/08/2014: Left lower lobe pneumonia ?06/21/2007: VENTRICULAR FUNCTION, DECREASED ? ?History reviewed. No pertinent surgical history. ? ?BMI   ? Body Mass Index: 39.87 kg/m?  ?  ? ? Reproductive/Obstetrics ?negative OB ROS ? ?  ? ? ? ? ? ? ? ? ? ? ? ? ? ?  ?  ? ? ? ? ? ? ? ? ?Anesthesia Physical ?Anesthesia Plan ? ?ASA: 3 ? ?Anesthesia Plan: General  ? ?Post-op Pain Management:   ? ?Induction: Intravenous ? ?PONV Risk Score and Plan: Propofol infusion and TIVA ? ?Airway Management Planned: Natural Airway and Nasal Cannula ? ?Additional Equipment:  ? ?Intra-op Plan:  ? ?Post-operative Plan:  ? ?Informed Consent: I have reviewed the patients History and Physical, chart, labs and discussed the  procedure including the risks, benefits and alternatives for the proposed anesthesia with the patient or authorized representative who has indicated his/her understanding and acceptance.  ? ? ? ?Dental Advisory Given ? ?Plan Discussed with: Anesthesiologist, CRNA and Surgeon ? ?Anesthesia Plan Comments: (Patient consented for risks of anesthesia including but not limited to:  ?- adverse reactions to medications ?- risk of airway placement if required ?- damage to eyes, teeth, lips or other oral mucosa ?- nerve damage due to positioning  ?- sore throat or hoarseness ?- Damage to heart, brain, nerves, lungs, other parts of body or loss of life ? ?Patient voiced understanding.)  ? ? ? ? ? ? ?Anesthesia Quick Evaluation ? ?

## 2021-10-02 NOTE — Transfer of Care (Signed)
Immediate Anesthesia Transfer of Care Note ? ?Patient: Alfred Johnson ? ?Procedure(s) Performed: COLONOSCOPY WITH PROPOFOL ?ESOPHAGOGASTRODUODENOSCOPY (EGD) ? ?Patient Location: Endoscopy Unit ? ?Anesthesia Type:General ? ?Level of Consciousness: drowsy ? ?Airway & Oxygen Therapy: Patient Spontanous Breathing and Patient connected to face mask oxygen ? ?Post-op Assessment: Report given to RN and Post -op Vital signs reviewed and stable ? ?Post vital signs: Reviewed and stable ? ?Last Vitals:  ?Vitals Value Taken Time  ?BP 114/74 10/02/21 0846  ?Temp    ?Pulse 92 10/02/21 0846  ?Resp 19 10/02/21 0846  ?SpO2 97 % 10/02/21 0846  ? ? ?Last Pain:  ?Vitals:  ? 10/02/21 0840  ?TempSrc: Temporal  ?PainSc:   ?   ? ?  ? ?Complications: No notable events documented. ?

## 2021-10-02 NOTE — Anesthesia Postprocedure Evaluation (Signed)
Anesthesia Post Note ? ?Patient: Alfred Johnson ? ?Procedure(s) Performed: COLONOSCOPY WITH PROPOFOL ?ESOPHAGOGASTRODUODENOSCOPY (EGD) ? ?Patient location during evaluation: Endoscopy ?Anesthesia Type: General ?Level of consciousness: awake and alert ?Pain management: pain level controlled ?Vital Signs Assessment: post-procedure vital signs reviewed and stable ?Respiratory status: spontaneous breathing, nonlabored ventilation, respiratory function stable and patient connected to nasal cannula oxygen ?Cardiovascular status: blood pressure returned to baseline and stable ?Postop Assessment: no apparent nausea or vomiting ?Anesthetic complications: no ? ? ?No notable events documented. ? ? ?Last Vitals:  ?Vitals:  ? 10/02/21 0910 10/02/21 0916  ?BP:  116/82  ?Pulse: 92 87  ?Resp: 20 17  ?Temp:    ?SpO2: 93% 94%  ?  ?Last Pain:  ?Vitals:  ? 10/02/21 0840  ?TempSrc: Temporal  ?PainSc:   ? ? ?  ?  ?  ?  ?  ?  ? ?[name] ? ? ? ? ?

## 2021-10-02 NOTE — Op Note (Signed)
[facility] ?Gastroenterology ?Patient Name: Alfred Johnson ?Procedure Date: 10/02/2021 7:05 AM ?MRN: [id number] ?Account #: [id number] ?Date of Birth: 12-24-1970 ?Admit Type: Outpatient ?Age: 51 ?Room: [facility] ENDO ROOM 4 ?Gender: Male ?Note Status: Finalized ?Instrument Name: Colonscope [id number] ?Procedure:             Colonoscopy ?Indications:           Screening for colorectal malignant neoplasm, This is  ?                       the patient's first colonoscopy ?Providers:             Lin Landsman MD, MD ?Referring MD:          Shanon Ace, MD (Referring MD) ?Medicines:             General Anesthesia ?Complications:         No immediate complications. Estimated blood loss: None. ?Procedure:             Pre-Anesthesia Assessment: ?                       - Prior to the procedure, a History and Physical was  ?                       performed, and patient medications and allergies were  ?                       reviewed. The patient is competent. The risks and  ?                       benefits of the procedure and the sedation options and  ?                       risks were discussed with the patient. All questions  ?                       were answered and informed consent was obtained.  ?                       Patient identification and proposed procedure were  ?                       verified by the physician, the nurse, the  ?                       anesthesiologist, the anesthetist and the technician  ?                       in the pre-procedure area in the procedure room in the  ?                       endoscopy suite. Mental Status Examination: alert and  ?                       oriented. Airway Examination: normal oropharyngeal  ?                       airway and neck mobility. Respiratory Examination:  ?  clear to auscultation. CV Examination: normal.  ?                       Prophylactic Antibiotics: The patient does not require  ?                       prophylactic  antibiotics. Prior Anticoagulants: The  ?                       patient has taken no previous anticoagulant or  ?                       antiplatelet agents. ASA Grade Assessment: III - A  ?                       patient with severe systemic disease. After reviewing  ?                       the risks and benefits, the patient was deemed in  ?                       satisfactory condition to undergo the procedure. The  ?                       anesthesia plan was to use general anesthesia.  ?                       Immediately prior to administration of medications,  ?                       the patient was re-assessed for adequacy to receive  ?                       sedatives. The heart rate, respiratory rate, oxygen  ?                       saturations, blood pressure, adequacy of pulmonary  ?                       ventilation, and response to care were monitored  ?                       throughout the procedure. The physical status of the  ?                       patient was re-assessed after the procedure. ?                       After obtaining informed consent, the colonoscope was  ?                       passed under direct vision. Throughout the procedure,  ?                       the patient's blood pressure, pulse, and oxygen  ?                       saturations were monitored continuously. The  ?  Colonoscope was introduced through the anus and  ?                       advanced to the the terminal ileum, with  ?                       identification of the appendiceal orifice and IC  ?                       valve. The colonoscopy was performed without  ?                       difficulty. The patient tolerated the procedure well.  ?                       The quality of the bowel preparation was evaluated  ?                       using the BBPS [facility] Bowel Preparation Scale) with  ?                       scores of: Right Colon = 3, Transverse Colon = 3 and  ?                       Left Colon  = 3 (entire mucosa seen well with no  ?                       residual staining, small fragments of stool or opaque  ?                       liquid). The total BBPS score equals 9. ?Findings: ?     The perianal and digital rectal examinations were normal. Pertinent  ?     negatives include normal sphincter tone and no palpable rectal lesions. ?     The terminal ileum appeared normal. ?     Two sessile polyps were found in the descending colon. The polyps were 3  ?     to 4 mm in size. These polyps were removed with a cold snare. Resection  ?     and retrieval were complete. ?     Multiple diverticula were found in the recto-sigmoid colon, sigmoid  ?     colon and descending colon. There was no evidence of diverticular  ?     bleeding. ?     Non-bleeding external hemorrhoids were found during retroflexion. The  ?     hemorrhoids were small. ?Impression:            - The examined portion of the ileum was normal. ?                       - Two 3 to 4 mm polyps in the descending colon,  ?                       removed with a cold snare. Resected and retrieved. ?                       - Moderate diverticulosis in the recto-sigmoid colon,  ?  in the sigmoid colon and in the descending colon.  ?                       There was no evidence of diverticular bleeding. ?                       - Non-bleeding external hemorrhoids. ?Recommendation:        - Discharge patient to home (with spouse). ?                       - Resume previous diet today. ?                       - Continue present medications. ?                       - Await pathology results. ?                       - Repeat colonoscopy in 7-10 years for surveillance. ?Procedure Code(s):     --- Professional --- ?                       [phone], Colonoscopy, flexible; with removal of  ?                       tumor(s), polyp(s), or other lesion(s) by snare  ?                       technique ?Diagnosis Code(s):     --- Professional --- ?                        Z12.11, Encounter for screening for malignant neoplasm  ?                       of colon ?                       K64.4, Residual hemorrhoidal skin tags ?                       K63.5, Polyp of colon ?                       K57.30, Diverticulosis of large intestine without  ?                       perforation or abscess without bleeding ?CPT copyright 2019 American Medical Association. All rights reserved. ?The codes documented in this report are preliminary and upon coder review may  ?be revised to meet current compliance requirements. ?Dr. Ulyess Mort ?Annalisse Minkoff Raeanne Gathers MD, MD ?10/02/2021 8:43:03 AM ?This report has been signed electronically. ?Number of Addenda: 0 ?Note Initiated On: 10/02/2021 7:05 AM ?Scope Withdrawal Time: 0 hours 12 minutes 0 seconds  ?Total Procedure Duration: 0 hours 14 minutes 45 seconds  ?Estimated Blood Loss:  Estimated blood loss: none. ?     [facility] ?

## 2021-10-02 NOTE — H&P (Signed)
?Cephas Darby, MD ?[address]  ?[address]  ?[place], [place] [address]  ?Main: [phone]  ?Fax: [phone] ?Pager: [phone] ? ?Primary Care Physician:  Burnis Medin, MD ?Primary Gastroenterologist:  Dr. Cephas Darby ? ?Pre-Procedure History & Physical: ?HPI:  Alfred Johnson is a 51 y.o. male is here for an endoscopy and colonoscopy. ?  ?Past Medical History:  ?Diagnosis Date  ? Acute medial meniscus tear of right knee 01/05/2018  ? ANEMIA DUE TO CHRONIC BLOOD LOSS 09/10/2009  ? Diabetes mellitus   ? Elevated BP   ? GERD (gastroesophageal reflux disease)   ? HYPERLIPIDEMIA, MIXED 03/23/2007  ? Hypertension   ? KIDNEY STONE 02/05/2009  ? Left lower lobe pneumonia 02/08/2014  ? VENTRICULAR FUNCTION, DECREASED 06/21/2007  ? ? ?History reviewed. No pertinent surgical history. ? ?Prior to Admission medications   ?Medication Sig Start Date End Date Taking? Authorizing Provider  ?aspirin 81 MG tablet Take 81 mg by mouth daily.   Yes [provider]  ?atorvastatin (LIPITOR) 40 MG tablet TAKE 1/2 A TABLET BY MOUTH DAILY. 12/03/20  Yes Hilty, Nadean Corwin, MD  ?bromocriptine (PARLODEL) 5 MG capsule Take 1 capsule (5 mg total) by mouth daily. 09/26/21  Yes Renato Shin, MD  ?cetirizine (ZYRTEC) 10 MG tablet Take 10 mg by mouth daily.   Yes [provider]  ?COVID-19 mRNA bivalent vaccine, Pfizer, (PFIZER COVID-19 VAC BIVALENT) injection Inject into the muscle. 05/30/21  Yes Carlyle Basques, MD  ?empagliflozin (JARDIANCE) 25 MG TABS tablet TAKE 25 MG BY MOUTH DAILY. 09/07/20  Yes Renato Shin, MD  ?glucose blood (ONE TOUCH ULTRA TEST) test strip 1 each by Other route 3 (three) times daily. And lancets 3/day 08/26/19  Yes Renato Shin, MD  ?metFORMIN (GLUCOPHAGE-XR) 500 MG 24 hr tablet Take 4 tablets (2,000 mg total) by mouth daily with breakfast. 09/11/20  Yes Renato Shin, MD  ?omeprazole (PRILOSEC) 40 MG capsule Take 1 capsule (40 mg total) by mouth daily before breakfast. 09/09/21 10/09/21 Yes  Lincoln Kleiner, Tally Due, MD  ?omeprazole (PRILOSEC) 40 MG capsule Take 1 capsule by mouth daily before breakfast 09/09/21  Yes   ?pioglitazone (ACTOS) 45 MG tablet Take 1 tablet by mouth once daily 08/05/21  Yes Renato Shin, MD  ?Semaglutide, 2 MG/DOSE, (OZEMPIC, 2 MG/DOSE,) 8 MG/3ML SOPN Inject 2 mg into the skin once a week. 06/05/21  Yes Renato Shin, MD  ?Semaglutide, 2 MG/DOSE, (OZEMPIC, 2 MG/DOSE,) 8 MG/3ML SOPN Inject 2 mg into the skin once a week. 06/05/21  Yes Renato Shin, MD  ? ? ?Allergies as of 09/09/2021  ? (No Known Allergies)  ? ? ?Family History  ?Problem Relation Age of Onset  ? Graves' disease Mother   ? Heart attack Father 29  ?     COD  ? Heart failure Father   ? Kidney disease Father   ? Diabetes Brother   ? HIV Brother   ? Heart attack Maternal Grandfather   ? Heart attack Paternal Grandfather   ? Heart disease Brother 74  ?     CABG  ? ? ?Social History  ? ?Socioeconomic History  ? Marital status: Married  ?  Spouse name: Not on file  ? Number of children: Not on file  ? Years of education: Not on file  ? Highest education level: Not on file  ?Occupational History  ?  Employer: SELF-EMPLOYED  ?  Comment: Does not work outside the home  ?Tobacco Use  ? Smoking status: Former  ?  Smokeless tobacco: Former  ?  Quit date: 07/28/1992  ?Vaping Use  ? Vaping Use: Never used  ?Substance and Sexual Activity  ? Alcohol use: No  ? Drug use: No  ? Sexual activity: Not on file  ?Other Topics Concern  ? Not on file  ?Social History Narrative  ? HH of 3  ? No pets  ? Firearms locked away  ? 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 Exercises regularly and does weight watcher at times  ? Painting  On side.   ? ?Social Determinants of Health  ? ?Financial Resource Strain: Not on file  ?Food Insecurity: Not on file  ?Transportation Needs: Not on file  ?Physical Activity: Not on file  ?Stress: Not on file  ?Social Connections: Not on file  ?Intimate Partner Violence: Not on file  ? ? ?Review of Systems: ?See HPI,  otherwise negative ROS ? ?Physical Exam: ?BP 133/88   Pulse 96   Temp (!) 97.4 ?F (36.3 ?C) (Temporal)   Resp 20   Ht '5\' 9"'$  (1.753 m)   Wt 122.5 kg   SpO2 97%   BMI 39.87 kg/m?  ?General:   Alert,  pleasant and cooperative in NAD ?Head:  Normocephalic and atraumatic. ?Neck:  Supple; no masses or thyromegaly. ?Lungs:  Clear throughout to auscultation.    ?Heart:  Regular rate and rhythm. ?Abdomen:  Soft, nontender and nondistended. Normal bowel sounds, without guarding, and without rebound.   ?Neurologic:  Alert and  oriented x4;  grossly normal neurologically. ? ?Impression/Plan: ?Alfred Johnson is here for an endoscopy and colonoscopy to be performed for chronic gerd and colon cancer screening ? ?Risks, benefits, limitations, and alternatives regarding  endoscopy and colonoscopy have been reviewed with the patient.  Questions have been answered.  All parties agreeable. ? ? ?Sherri Sear, MD  10/02/2021, 8:04 AM ?

## 2021-10-03 ENCOUNTER — Encounter: Payer: Self-pay | Admitting: Gastroenterology

## 2021-10-03 ENCOUNTER — Ambulatory Visit: Payer: 59 | Admitting: Endocrinology

## 2021-10-03 LAB — SURGICAL PATHOLOGY

## 2021-10-07 ENCOUNTER — Other Ambulatory Visit (HOSPITAL_BASED_OUTPATIENT_CLINIC_OR_DEPARTMENT_OTHER): Payer: Self-pay

## 2021-10-07 ENCOUNTER — Other Ambulatory Visit: Payer: Self-pay

## 2021-10-07 ENCOUNTER — Encounter: Payer: Self-pay | Admitting: Endocrinology

## 2021-10-07 ENCOUNTER — Ambulatory Visit (INDEPENDENT_AMBULATORY_CARE_PROVIDER_SITE_OTHER): Payer: 59 | Admitting: Endocrinology

## 2021-10-07 VITALS — BP 114/72 | HR 82 | Ht 69.0 in | Wt 275.0 lb

## 2021-10-07 DIAGNOSIS — E119 Type 2 diabetes mellitus without complications: Secondary | ICD-10-CM

## 2021-10-07 LAB — POCT GLYCOSYLATED HEMOGLOBIN (HGB A1C): Hemoglobin A1C: 7.1 % — AB (ref 4.0–5.6)

## 2021-10-07 MED ORDER — COLESEVELAM HCL 625 MG PO TABS
1250.0000 mg | ORAL_TABLET | Freq: Every day | ORAL | 3 refills | Status: DC
  Filled 2021-10-07: qty 180, 90d supply, fill #0
  Filled 2021-12-18: qty 180, 90d supply, fill #1
  Filled 2022-04-01: qty 180, 90d supply, fill #2
  Filled 2022-06-30: qty 180, 90d supply, fill #3

## 2021-10-07 MED ORDER — BROMOCRIPTINE MESYLATE 5 MG PO CAPS
5.0000 mg | ORAL_CAPSULE | Freq: Every day | ORAL | 3 refills | Status: DC
  Filled 2021-10-07 – 2021-12-18 (×2): qty 90, 90d supply, fill #0

## 2021-10-07 NOTE — Progress Notes (Signed)
? ?Subjective:  ? ? Patient ID: Alfred Johnson, male    DOB: 1971-02-01, 51 y.o.   MRN: [id number] ? ?HPI ?Pt returns for f/u of diabetes mellitus:  ?DM type: 2 ?Dx'ed: 2008 ?Complications: none ?Therapy: Ozempic and 4 oral meds ?DKA: never ?Severe hypoglycemia: never. ?Pancreatitis: never.  ?Other: he has never been on insulin; he declines weight loss surgery; he did not tolerate invokana (itching).   ?Interval history: He says cbg's are in the 100's.  It is in general highest at HS.  He takes meds as rx'ed.  pt states he feels well in general.   ?Past Medical History:  ?Diagnosis Date  ? Acute medial meniscus tear of right knee 01/05/2018  ? ANEMIA DUE TO CHRONIC BLOOD LOSS 09/10/2009  ? Diabetes mellitus   ? Elevated BP   ? GERD (gastroesophageal reflux disease)   ? HYPERLIPIDEMIA, MIXED 03/23/2007  ? Hypertension   ? KIDNEY STONE 02/05/2009  ? Left lower lobe pneumonia 02/08/2014  ? VENTRICULAR FUNCTION, DECREASED 06/21/2007  ? ? ?Past Surgical History:  ?Procedure Laterality Date  ? COLONOSCOPY WITH PROPOFOL N/A 10/02/2021  ? Procedure: COLONOSCOPY WITH PROPOFOL;  Surgeon: Lin Landsman, MD;  Location: [facility] ENDOSCOPY;  Service: Gastroenterology;  Laterality: N/A;  ? ESOPHAGOGASTRODUODENOSCOPY N/A 10/02/2021  ? Procedure: ESOPHAGOGASTRODUODENOSCOPY (EGD);  Surgeon: Lin Landsman, MD;  Location: [facility] ENDOSCOPY;  Service: Gastroenterology;  Laterality: N/A;  ? ? ?Social History  ? ?Socioeconomic History  ? Marital status: Married  ?  Spouse name: Not on file  ? Number of children: Not on file  ? Years of education: Not on file  ? Highest education level: Not on file  ?Occupational History  ?  Employer: SELF-EMPLOYED  ?  Comment: Does not work outside the home  ?Tobacco Use  ? Smoking status: Former  ? Smokeless tobacco: Former  ?  Quit date: 07/28/1992  ?Vaping Use  ? Vaping Use: Never used  ?Substance and Sexual Activity  ? Alcohol use: No  ? Drug use: No  ? Sexual activity: Not on file  ?Other Topics Concern   ? Not on file  ?Social History Narrative  ? HH of 3  ? No pets  ? Firearms locked away  ? 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 Exercises regularly and does weight watcher at times  ? Painting  On side.   ? ?Social Determinants of Health  ? ?Financial Resource Strain: Not on file  ?Food Insecurity: Not on file  ?Transportation Needs: Not on file  ?Physical Activity: Not on file  ?Stress: Not on file  ?Social Connections: Not on file  ?Intimate Partner Violence: Not on file  ? ? ?Current Outpatient Medications on File Prior to Visit  ?Medication Sig Dispense Refill  ? aspirin 81 MG tablet Take 81 mg by mouth daily.    ? atorvastatin (LIPITOR) 40 MG tablet TAKE 1/2 A TABLET BY MOUTH DAILY. 45 tablet 3  ? cetirizine (ZYRTEC) 10 MG tablet Take 10 mg by mouth daily.    ? COVID-19 mRNA bivalent vaccine, Pfizer, (PFIZER COVID-19 VAC BIVALENT) injection Inject into the muscle. 0.3 mL 0  ? empagliflozin (JARDIANCE) 25 MG TABS tablet TAKE 25 MG BY MOUTH DAILY. 90 tablet 3  ? glucose blood (ONE TOUCH ULTRA TEST) test strip 1 each by Other route 3 (three) times daily. And lancets 3/day 100 each 11  ? metFORMIN (GLUCOPHAGE-XR) 500 MG 24 hr tablet Take 4 tablets (2,000 mg  total) by mouth daily with breakfast. 360 tablet 3  ? omeprazole (PRILOSEC) 40 MG capsule Take 1 capsule (40 mg total) by mouth daily before breakfast. 30 capsule 0  ? omeprazole (PRILOSEC) 40 MG capsule Take 1 capsule (40 mg total) by mouth daily before breakfast. 90 capsule 0  ? pioglitazone (ACTOS) 45 MG tablet Take 1 tablet by mouth once daily 90 tablet 3  ? Semaglutide, 2 MG/DOSE, (OZEMPIC, 2 MG/DOSE,) 8 MG/3ML SOPN Inject 2 mg into the skin once a week. 9 mL 2  ? ?No current facility-administered medications on file prior to visit.  ? ? ?No Known Allergies ? ?Family History  ?Problem Relation Age of Onset  ? Graves' disease Mother   ? Heart attack Father 37  ?     COD  ? Heart failure Father   ? Kidney disease Father   ? Diabetes Brother   ? HIV  Brother   ? Heart attack Maternal Grandfather   ? Heart attack Paternal Grandfather   ? Heart disease Brother 64  ?     CABG  ? ? ?BP 114/72 (BP Location: Left Arm, Patient Position: Sitting, Cuff Size: Normal)   Pulse 82   Ht '5\' 9"'$  (1.753 m)   Wt 275 lb (124.7 kg)   SpO2 94%   BMI 40.61 kg/m?  ? ? ?Review of Systems ?Denies N/HB.   ?   ?Objective:  ? Physical Exam ?VITAL SIGNS:  See vs page ?GENERAL: no distress ?EXT: no leg edema.   ? ? ?A1c=7.1% ?   ?Assessment & Plan:  ?Type 2 DM: uncontrolled ? ?Patient Instructions  ?I have sent a prescription to your pharmacy, add colesevelam.   ?Please continue the same other diabetes medications.   ?check your blood sugar 3 times a day.  vary the time of day when you check, between before the 3 meals, and at bedtime.  also check if you have symptoms of your blood sugar being too high or too low.  please keep a record of the readings and bring it to your next appointment here (or you can bring the meter itself).  You can write it on any piece of paper.  please call us sooner if your blood sugar goes below 70, or if you have a lot of readings over 200.   ?Please come back for a follow-up appointment in 3-4 months.   ? ? ?

## 2021-10-07 NOTE — Patient Instructions (Signed)
I have sent a prescription to your pharmacy, add colesevelam.   ?Please continue the same other diabetes medications.   ?check your blood sugar 3 times a day.  vary the time of day when you check, between before the 3 meals, and at bedtime.  also check if you have symptoms of your blood sugar being too high or too low.  please keep a record of the readings and bring it to your next appointment here (or you can bring the meter itself).  You can write it on any piece of paper.  please call us sooner if your blood sugar goes below 70, or if you have a lot of readings over 200.   ?Please come back for a follow-up appointment in 3-4 months.   ?

## 2021-11-04 ENCOUNTER — Other Ambulatory Visit (HOSPITAL_BASED_OUTPATIENT_CLINIC_OR_DEPARTMENT_OTHER): Payer: Self-pay

## 2021-11-04 ENCOUNTER — Other Ambulatory Visit: Payer: Self-pay | Admitting: Endocrinology

## 2021-11-04 DIAGNOSIS — E119 Type 2 diabetes mellitus without complications: Secondary | ICD-10-CM

## 2021-11-04 MED ORDER — METFORMIN HCL ER 500 MG PO TB24
2000.0000 mg | ORAL_TABLET | Freq: Every day | ORAL | 1 refills | Status: DC
  Filled 2021-11-04: qty 360, 90d supply, fill #0
  Filled 2022-02-05: qty 360, 90d supply, fill #1

## 2021-11-04 MED ORDER — EMPAGLIFLOZIN 25 MG PO TABS
ORAL_TABLET | ORAL | 1 refills | Status: DC
  Filled 2021-11-04: qty 90, 90d supply, fill #0
  Filled 2022-02-05: qty 90, 90d supply, fill #1

## 2021-11-11 ENCOUNTER — Other Ambulatory Visit (HOSPITAL_BASED_OUTPATIENT_CLINIC_OR_DEPARTMENT_OTHER): Payer: Self-pay

## 2021-11-14 ENCOUNTER — Other Ambulatory Visit (HOSPITAL_BASED_OUTPATIENT_CLINIC_OR_DEPARTMENT_OTHER): Payer: Self-pay

## 2021-12-18 ENCOUNTER — Other Ambulatory Visit (HOSPITAL_BASED_OUTPATIENT_CLINIC_OR_DEPARTMENT_OTHER): Payer: Self-pay

## 2022-01-09 ENCOUNTER — Other Ambulatory Visit: Payer: Self-pay | Admitting: Internal Medicine

## 2022-01-09 ENCOUNTER — Other Ambulatory Visit: Payer: Self-pay | Admitting: Gastroenterology

## 2022-01-09 DIAGNOSIS — R142 Eructation: Secondary | ICD-10-CM

## 2022-01-09 DIAGNOSIS — K219 Gastro-esophageal reflux disease without esophagitis: Secondary | ICD-10-CM

## 2022-01-10 ENCOUNTER — Other Ambulatory Visit (HOSPITAL_BASED_OUTPATIENT_CLINIC_OR_DEPARTMENT_OTHER): Payer: Self-pay

## 2022-01-10 MED ORDER — ATORVASTATIN CALCIUM 40 MG PO TABS
ORAL_TABLET | ORAL | 3 refills | Status: DC
Start: 2022-01-10 — End: 2022-12-30
  Filled 2022-01-10: qty 45, 90d supply, fill #0
  Filled 2022-04-10: qty 45, 90d supply, fill #1
  Filled 2022-07-08 – 2022-07-09 (×2): qty 45, 90d supply, fill #2
  Filled 2022-10-07: qty 15, 30d supply, fill #3
  Filled 2022-11-01: qty 15, 30d supply, fill #4
  Filled 2022-12-02: qty 15, 30d supply, fill #5

## 2022-01-10 MED ORDER — OMEPRAZOLE 40 MG PO CPDR
40.0000 mg | DELAYED_RELEASE_CAPSULE | Freq: Every day | ORAL | 3 refills | Status: DC
  Filled 2022-01-10: qty 30, 30d supply, fill #0
  Filled 2022-02-05: qty 30, 30d supply, fill #1
  Filled 2022-03-14: qty 30, 30d supply, fill #2
  Filled 2022-04-10: qty 30, 30d supply, fill #3

## 2022-02-03 ENCOUNTER — Ambulatory Visit (INDEPENDENT_AMBULATORY_CARE_PROVIDER_SITE_OTHER): Payer: 59 | Admitting: Podiatry

## 2022-02-03 ENCOUNTER — Encounter: Payer: Self-pay | Admitting: Podiatry

## 2022-02-03 DIAGNOSIS — E139 Other specified diabetes mellitus without complications: Secondary | ICD-10-CM | POA: Diagnosis not present

## 2022-02-03 NOTE — Progress Notes (Signed)
Subjective:   Patient ID: Alfred Johnson, male   DOB: 51 y.o.   MRN: [id number]   HPI Patient presents stating he has had approximate 10 to 15-year history of diabetes and has not had a foot check done up to this point.  States his A1c has been around 7.0 recently.  Patient does not smoke likes to be active   Review of Systems  All other systems reviewed and are negative.       Objective:  Physical Exam Vitals and nursing note reviewed.  Constitutional:      Appearance: He is well-developed.  Pulmonary:     Effort: Pulmonary effort is normal.  Musculoskeletal:        General: Normal range of motion.  Skin:    General: Skin is warm.  Neurological:     Mental Status: He is alert.     Neurovascular status found to be intact muscle strength was adequate range of motion adequate.  Patient is noted to have good neurological both sharp dull and vibratory and does have adequate range of motion of the MPJs.  No keratotic lesion mild dry skin on the heel region bilateral     Assessment:  Overall doing well with his diabetes from a foot perspective with good control of his A1c     Plan:  H&P discussed generalized foot examination and daily foot inspections.  I encouraged him to lose approximately 20 to 25 pounds which I think would be of good benefit and patient will try to do this and will continue with using creams on his heel encouraged to call with questions concerns

## 2022-02-05 ENCOUNTER — Other Ambulatory Visit (HOSPITAL_BASED_OUTPATIENT_CLINIC_OR_DEPARTMENT_OTHER): Payer: Self-pay

## 2022-02-05 ENCOUNTER — Other Ambulatory Visit: Payer: Self-pay

## 2022-02-06 ENCOUNTER — Other Ambulatory Visit (HOSPITAL_BASED_OUTPATIENT_CLINIC_OR_DEPARTMENT_OTHER): Payer: Self-pay

## 2022-02-07 ENCOUNTER — Other Ambulatory Visit (HOSPITAL_BASED_OUTPATIENT_CLINIC_OR_DEPARTMENT_OTHER): Payer: Self-pay

## 2022-02-10 ENCOUNTER — Other Ambulatory Visit: Payer: Self-pay | Admitting: Endocrinology

## 2022-02-10 ENCOUNTER — Other Ambulatory Visit (HOSPITAL_BASED_OUTPATIENT_CLINIC_OR_DEPARTMENT_OTHER): Payer: Self-pay

## 2022-02-11 ENCOUNTER — Other Ambulatory Visit (HOSPITAL_BASED_OUTPATIENT_CLINIC_OR_DEPARTMENT_OTHER): Payer: Self-pay

## 2022-02-11 MED ORDER — OZEMPIC (2 MG/DOSE) 8 MG/3ML ~~LOC~~ SOPN
2.0000 mg | PEN_INJECTOR | SUBCUTANEOUS | 2 refills | Status: DC
  Filled 2022-02-11: qty 9, 84d supply, fill #0
  Filled 2022-06-02: qty 3, 28d supply, fill #1
  Filled 2022-06-25: qty 3, 28d supply, fill #2

## 2022-02-27 ENCOUNTER — Ambulatory Visit: Payer: 59 | Admitting: Endocrinology

## 2022-03-14 ENCOUNTER — Other Ambulatory Visit (HOSPITAL_BASED_OUTPATIENT_CLINIC_OR_DEPARTMENT_OTHER): Payer: Self-pay

## 2022-03-17 ENCOUNTER — Ambulatory Visit (INDEPENDENT_AMBULATORY_CARE_PROVIDER_SITE_OTHER): Payer: 59 | Admitting: Internal Medicine

## 2022-03-17 ENCOUNTER — Encounter: Payer: Self-pay | Admitting: Internal Medicine

## 2022-03-17 VITALS — BP 104/66 | HR 75 | Ht 69.0 in | Wt 273.6 lb

## 2022-03-17 DIAGNOSIS — E785 Hyperlipidemia, unspecified: Secondary | ICD-10-CM | POA: Diagnosis not present

## 2022-03-17 DIAGNOSIS — E1165 Type 2 diabetes mellitus with hyperglycemia: Secondary | ICD-10-CM | POA: Diagnosis not present

## 2022-03-17 LAB — POCT GLYCOSYLATED HEMOGLOBIN (HGB A1C): Hemoglobin A1C: 7.2 % — AB (ref 4.0–5.6)

## 2022-03-17 NOTE — Patient Instructions (Addendum)
Please continue: - Metformin ER 2000 mg but move it to dinnertime - Actos 45 mg daily in am - Welchol 625 x 2 tablets daily - Jardiance 25 mg before breakfast  Use: - Ozempic 1-2 mg weekly  Please stop: - Bromocriptine  Please return in 3-4 months with your sugar log.   PATIENT INSTRUCTIONS FOR TYPE 2 DIABETES:  DIET AND EXERCISE Diet and exercise is an important part of diabetic treatment.  We recommended aerobic exercise in the form of brisk walking (working between 40-60% of maximal aerobic capacity, similar to brisk walking) for 150 minutes per week (such as 30 minutes five days per week) along with 3 times per week performing 'resistance' training (using various gauge rubber tubes with handles) 5-10 exercises involving the major muscle groups (upper body, lower body and core) performing 10-15 repetitions (or near fatigue) each exercise. Start at half the above goal but build slowly to reach the above goals. If limited by weight, joint pain, or disability, we recommend daily walking in a swimming pool with water up to waist to reduce pressure from joints while allow for adequate exercise.    BLOOD GLUCOSES Monitoring your blood glucoses is important for continued management of your diabetes. Please check your blood glucoses 2-4 times a day: fasting, before meals and at bedtime (you can rotate these measurements - e.g. one day check before the 3 meals, the next day check before 2 of the meals and before bedtime, etc.).   HYPOGLYCEMIA (low blood sugar) Hypoglycemia is usually a reaction to not eating, exercising, or taking too much insulin/ other diabetes drugs.  Symptoms include tremors, sweating, hunger, confusion, headache, etc. Treat IMMEDIATELY with 15 grams of Carbs: 4 glucose tablets  cup regular juice/soda 2 tablespoons raisins 4 teaspoons sugar 1 tablespoon honey Recheck blood glucose in 15 mins and repeat above if still symptomatic/blood glucose <100.  RECOMMENDATIONS  TO REDUCE YOUR RISK OF DIABETIC COMPLICATIONS: * Take your prescribed MEDICATION(S) * Follow a DIABETIC diet: Complex carbs, fiber rich foods, (monounsaturated and polyunsaturated) fats * AVOID saturated/trans fats, high fat foods, >2,300 mg salt per day. * EXERCISE at least 5 times a week for 30 minutes or preferably daily.  * DO NOT SMOKE OR DRINK more than 1 drink a day. * Check your FEET every day. Do not wear tightfitting shoes. Contact us if you develop an ulcer * See your EYE doctor once a year or more if needed * Get a FLU shot once a year * Get a PNEUMONIA vaccine once before and once after age 14 years  GOALS:  * Your Hemoglobin A1c of <7%  * fasting sugars need to be <130 * after meals sugars need to be <180 (2h after you start eating) * Your Systolic BP should be 001 or lower  * Your Diastolic BP should be 80 or lower  * Your HDL (Good Cholesterol) should be 40 or higher  * Your LDL (Bad Cholesterol) should be 100 or lower. * Your Triglycerides should be 150 or lower  * Your Urine microalbumin (kidney function) should be <30 * Your Body Mass Index should be 25 or lower    Please consider the following ways to cut down carbs and fat and increase fiber and micronutrients in your diet: - substitute whole grain for white bread or pasta - substitute brown rice for white rice - substitute 90-calorie flat bread pieces for slices of bread when possible - substitute sweet potatoes or yams for white potatoes - substitute  humus for margarine - substitute tofu for cheese when possible - substitute almond or rice milk for regular milk (would not drink soy milk daily due to concern for soy estrogen influence on breast cancer risk) - substitute dark chocolate for other sweets when possible - substitute water - can add lemon or orange slices for taste - for diet sodas (artificial sweeteners will trick your body that you can eat sweets without getting calories and will lead you to  overeating and weight gain in the long run) - do not skip breakfast or other meals (this will slow down the metabolism and will result in more weight gain over time)  - can try smoothies made from fruit and almond/rice milk in am instead of regular breakfast - can also try old-fashioned (not instant) oatmeal made with almond/rice milk in am - order the dressing on the side when eating salad at a restaurant (pour less than half of the dressing on the salad) - eat as little meat as possible - can try juicing, but should not forget that juicing will get rid of the fiber, so would alternate with eating raw veg./fruits or drinking smoothies - use as little oil as possible, even when using olive oil - can dress a salad with a mix of balsamic vinegar and lemon juice, for e.g. - use agave nectar, stevia sugar, or regular sugar rather than artificial sweateners - steam or broil/roast veggies  - snack on veggies/fruit/nuts (unsalted, preferably) when possible, rather than processed foods - reduce or eliminate aspartame in diet (it is in diet sodas, chewing gum, etc) Read the labels!  Try to read Dr. Janene Harvey book: "Program for Reversing Diabetes" for other ideas for healthy eating.

## 2022-03-17 NOTE — Progress Notes (Signed)
Patient ID: Alfred Johnson, male   DOB: 28-Oct-1970, 51 y.o.   MRN: [id number]  HPI: Alfred Johnson is a 51 y.o.-year-old male, returning for follow-up for DM2, dx in 2008, non-insulin-dependent, fairly well controlled, without long-term complications. Pt. previously saw Dr. Loanne Drilling, last visit 5 months ago.  Reviewed HbA1c: Lab Results  Component Value Date   HGBA1C 7.1 (A) 10/07/2021   HGBA1C 7.2 (A) 06/05/2021   HGBA1C 7.7 (H) 05/27/2021   HGBA1C 7.5 (A) 02/05/2021   HGBA1C 7.0 (A) 10/02/2020   HGBA1C 7.1 (A) 05/30/2020   HGBA1C 7.2 (A) 02/07/2020   HGBA1C 7.4 (A) 11/01/2019   HGBA1C 8.4 (H) 08/19/2019   HGBA1C 7.3 (A) 05/18/2019   Pt is on a regimen of: - Metformin ER 2000 mg in am - Actos 45 mg daily in am - Welchol 625 x 2 tablets daily - added in 09/2021 - Bromocriptine 5 mg daily - Jardiance 25 mg before breakfast - increased urination - Ozempic 2 mg weekly - hypersensitivity to foods, nausea, occasional vomiting She previously tried Invokana >> itching.  Pt checks his sugars 1x a day and they are: - am: 140-150 - 2h after b'fast: n/c - before lunch: n/c - 2h after lunch: n/c - before dinner: n/c - 2h after dinner: n/c - bedtime: n/c - nighttime: n/c Lowest sugar was 90s; he has hypoglycemia awareness at 90.  Highest sugar was 190.  Glucometer: One Touch ultra  - no CKD, last BUN/creatinine:  Lab Results  Component Value Date   BUN 14 05/27/2021   BUN 22 08/19/2019   CREATININE 0.84 05/27/2021   CREATININE 0.95 08/19/2019  On Jardiance.  -+ HL; last set of lipids: Lab Results  Component Value Date   CHOL 114 05/27/2021   HDL 42.60 05/27/2021   LDLCALC 61 05/27/2021   LDLDIRECT 114.5 02/26/2007   TRIG 55.0 05/27/2021   CHOLHDL 3 05/27/2021  On Lipitor 20 mg daily.  - last eye exam was in 04/2021. No DR reportedly but in 2021: + DR.   - no numbness and tingling in his feet.  Latest foot exam was by Dr. Paulla Dolly 02/03/2022.  He also has a history  of kidney stones, GERD, HTN, obesity. Per review of Dr. Cordelia Pen note, he declined weight loss surgery in the past.  ROS: + see HPI + increased urination, no blurry vision, + nausea, no chest pain.  Past Medical History:  Diagnosis Date   Acute medial meniscus tear of right knee 01/05/2018   ANEMIA DUE TO CHRONIC BLOOD LOSS 09/10/2009   Diabetes mellitus    Elevated BP    GERD (gastroesophageal reflux disease)    HYPERLIPIDEMIA, MIXED 03/23/2007   Hypertension    KIDNEY STONE 02/05/2009   Left lower lobe pneumonia 02/08/2014   VENTRICULAR FUNCTION, DECREASED 06/21/2007   Past Surgical History:  Procedure Laterality Date   COLONOSCOPY WITH PROPOFOL N/A 10/02/2021   Procedure: COLONOSCOPY WITH PROPOFOL;  Surgeon: Lin Landsman, MD;  Location: [place];  Service: Gastroenterology;  Laterality: N/A;   ESOPHAGOGASTRODUODENOSCOPY N/A 10/02/2021   Procedure: ESOPHAGOGASTRODUODENOSCOPY (EGD);  Surgeon: Lin Landsman, MD;  Location: [facility] ENDOSCOPY;  Service: Gastroenterology;  Laterality: N/A;   Social History   Socioeconomic History   Marital status: Married    Spouse name: Not on file   Number of children: Not on file   Years of education: Not on file   Highest education level: Not on file  Occupational History    Employer: SELF-EMPLOYED  Comment: Does not work outside the home  Tobacco Use   Smoking status: Former   Smokeless tobacco: Former    Quit date: 07/28/1992  Vaping Use   Vaping Use: Never used  Substance and Sexual Activity   Alcohol use: No   Drug use: No   Sexual activity: Not on file  Other Topics Concern   Not on file  Social History Narrative   HH of 3   No pets   Firearms locked away   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    Social Determinants of Health   Financial Resource Strain: Not on file  Food Insecurity: Not on file  Transportation Needs: Not on  file  Physical Activity: Not on file  Stress: Not on file  Social Connections: Not on file  Intimate Partner Violence: Not on file   Current Outpatient Medications on File Prior to Visit  Medication Sig Dispense Refill   aspirin 81 MG tablet Take 81 mg by mouth daily.     atorvastatin (LIPITOR) 40 MG tablet TAKE 1/2 A TABLET BY MOUTH DAILY. 45 tablet 3   bromocriptine (PARLODEL) 5 MG capsule Take 1 capsule (5 mg total) by mouth daily. 90 capsule 3   cetirizine (ZYRTEC) 10 MG tablet Take 10 mg by mouth daily.     colesevelam (WELCHOL) 625 MG tablet Take 2 tablets (1,250 mg total) by mouth daily. 180 tablet 3   COVID-19 mRNA bivalent vaccine, Pfizer, (PFIZER COVID-19 VAC BIVALENT) injection Inject into the muscle. 0.3 mL 0   empagliflozin (JARDIANCE) 25 MG TABS tablet TAKE 25 MG BY MOUTH DAILY. 90 tablet 1   glucose blood (ONE TOUCH ULTRA TEST) test strip 1 each by Other route 3 (three) times daily. And lancets 3/day 100 each 11   metFORMIN (GLUCOPHAGE-XR) 500 MG 24 hr tablet Take 4 tablets (2,000 mg total) by mouth daily with breakfast. 360 tablet 1   omeprazole (PRILOSEC) 40 MG capsule Take 1 capsule (40 mg total) by mouth daily before breakfast. 90 capsule 0   omeprazole (PRILOSEC) 40 MG capsule Take 1 capsule (40 mg total) by mouth daily before breakfast. 30 capsule 3   pioglitazone (ACTOS) 45 MG tablet Take 1 tablet by mouth once daily 90 tablet 3   Semaglutide, 2 MG/DOSE, (OZEMPIC, 2 MG/DOSE,) 8 MG/3ML SOPN Inject 2 mg into the skin once a week. 9 mL 2   Semaglutide, 2 MG/DOSE, (OZEMPIC, 2 MG/DOSE,) 8 MG/3ML SOPN Inject 2 mg into the skin once a week. 9 mL 2   No current facility-administered medications on file prior to visit.   No Known Allergies Family History  Problem Relation Age of Onset   Graves' disease Mother    Heart attack Father 31       COD   Heart failure Father    Kidney disease Father    Diabetes Brother    HIV Brother    Heart attack Maternal Grandfather     Heart attack Paternal Grandfather    Heart disease Brother 13       CABG   PE: BP 104/66 (BP Location: Left Arm, Patient Position: Sitting, Cuff Size: Normal)   Pulse 75   Ht '5\' 9"'$  (1.753 m)   Wt 273 lb 9.6 oz (124.1 kg)   SpO2 93%   BMI 40.40 kg/m  Wt Readings from Last 3 Encounters:  03/17/22  273 lb 9.6 oz (124.1 kg)  10/07/21 275 lb (124.7 kg)  10/02/21 270 lb (122.5 kg)   Constitutional: overweight, in NAD Eyes: no exophthalmos ENT: moist mucous membranes, no thyromegaly, no cervical lymphadenopathy Cardiovascular: RRR, No MRG Respiratory: CTA B Musculoskeletal: no deformities Skin: moist, warm, no rashes Neurological: no tremor with outstretched hands  ASSESSMENT: 1. DM2, non-insulin-dependent, fairly well controlled, without long-term complications  2. HL  PLAN:  1. Patient with long-standing, controlled diabetes, on oral antidiabetic regimen, with latest HbA1c 7.1% at last OV with Dr. Loanne Drilling, lower. Today: HbA1c is likely higher, 7.2%. -At today's visit, reviewing his blood sugars at home, they appear to be higher than target in the morning and he is not checking later in the day.  I did advise him to start doing so.  To improve his morning sugars, I advised him to move the metformin all at dinnertime.  This will allow him to start the day with better blood sugars.  I also advised him to stop bromocriptine, which is expensive for him, and I do not feel it is helping much.  Since he describes occasional nausea and even vomiting with Ozempic we discussed about switching between the 1 and the 2 mg dose.  He would not want to reduce the dose to 1 mg all the time.  We discussed about staying with smaller meals, also. -For now, we can continue the rest of the regimen but at next visit I would like to start reducing the WelChol and the Actos doses, if sugars improved. - I suggested to:  Patient Instructions  Please continue: - Metformin ER 2000 mg but move it to dinnertime -  Actos 45 mg daily in am - Welchol 625 x 2 tablets daily - Jardiance 25 mg before breakfast  Use: - Ozempic 1-2 mg weekly  Please stop: - Bromocriptine  Please return in 3-4 months with your sugar log.   - check sugars at different times of the day - check 1-2x a day, rotating checks - discussed about CBG targets for treatment: 80-130 mg/dL before meals and <180 mg/dL after meals; target HbA1c <7%. - given foot care handout  - given instructions for hypoglycemia management "15-15 rule"  - advised for yearly eye exams  - Return to clinic in 3-4 mo with sugar log   2. HL - Reviewed latest lipid panel from 04/2021: Fractions at goal: Lab Results  Component Value Date   CHOL 114 05/27/2021   HDL 42.60 05/27/2021   LDLCALC 61 05/27/2021   LDLDIRECT 114.5 02/26/2007   TRIG 55.0 05/27/2021   CHOLHDL 3 05/27/2021  - Continues lipitor 20 mg daily without side effects.   Philemon Kingdom, MD PhD [facility] Endocrinology

## 2022-03-26 ENCOUNTER — Other Ambulatory Visit (HOSPITAL_BASED_OUTPATIENT_CLINIC_OR_DEPARTMENT_OTHER): Payer: Self-pay

## 2022-04-01 ENCOUNTER — Other Ambulatory Visit (HOSPITAL_BASED_OUTPATIENT_CLINIC_OR_DEPARTMENT_OTHER): Payer: Self-pay

## 2022-04-02 ENCOUNTER — Other Ambulatory Visit (HOSPITAL_BASED_OUTPATIENT_CLINIC_OR_DEPARTMENT_OTHER): Payer: Self-pay

## 2022-04-11 ENCOUNTER — Other Ambulatory Visit (HOSPITAL_BASED_OUTPATIENT_CLINIC_OR_DEPARTMENT_OTHER): Payer: Self-pay

## 2022-05-06 ENCOUNTER — Encounter (HOSPITAL_BASED_OUTPATIENT_CLINIC_OR_DEPARTMENT_OTHER): Payer: Self-pay | Admitting: Internal Medicine

## 2022-05-06 ENCOUNTER — Ambulatory Visit (INDEPENDENT_AMBULATORY_CARE_PROVIDER_SITE_OTHER): Payer: 59 | Admitting: Internal Medicine

## 2022-05-06 ENCOUNTER — Other Ambulatory Visit (HOSPITAL_BASED_OUTPATIENT_CLINIC_OR_DEPARTMENT_OTHER): Payer: Self-pay

## 2022-05-06 VITALS — BP 115/76 | HR 91 | Ht 70.0 in | Wt 278.2 lb

## 2022-05-06 DIAGNOSIS — E119 Type 2 diabetes mellitus without complications: Secondary | ICD-10-CM

## 2022-05-06 DIAGNOSIS — E785 Hyperlipidemia, unspecified: Secondary | ICD-10-CM

## 2022-05-06 DIAGNOSIS — I10 Essential (primary) hypertension: Secondary | ICD-10-CM

## 2022-05-06 NOTE — Patient Instructions (Signed)
Medication Instructions:  Your Physician recommend you continue on your current medication as directed.    *If you need a refill on your cardiac medications before your next appointment, please call your pharmacy*   Lab Work: None ordered today   Testing/Procedures: CT coronary calcium score.   Test locations:  [place]   This is $99 out of pocket.   Coronary CalciumScan A coronary calcium scan is an imaging test used to look for deposits of calcium and other fatty materials (plaques) in the inner lining of the blood vessels of the heart (coronary arteries). These deposits of calcium and plaques can partly clog and narrow the coronary arteries without producing any symptoms or warning signs. This puts a person at risk for a heart attack. This test can detect these deposits before symptoms develop. Tell a health care provider about: Any allergies you have. All medicines you are taking, including vitamins, herbs, eye drops, creams, and over-the-counter medicines. Any problems you or family members have had with anesthetic medicines. Any blood disorders you have. Any surgeries you have had. Any medical conditions you have. Whether you are pregnant or may be pregnant. What are the risks? Generally, this is a safe procedure. However, problems may occur, including: Harm to a pregnant woman and her unborn baby. This test involves the use of radiation. Radiation exposure can be dangerous to a pregnant woman and her unborn baby. If you are pregnant, you generally should not have this procedure done. Slight increase in the risk of cancer. This is because of the radiation involved in the test. What happens before the procedure? No preparation is needed for this procedure. What happens during the procedure? You will undress and remove any jewelry around your neck or chest. You will put on a hospital gown. Sticky electrodes will be placed on your chest. The electrodes will be  connected to an electrocardiogram (ECG) machine to record a tracing of the electrical activity of your heart. A CT scanner will take pictures of your heart. During this time, you will be asked to lie still and hold your breath for 2-3 seconds while a picture of your heart is being taken. The procedure may vary among health care providers and hospitals. What happens after the procedure? You can get dressed. You can return to your normal activities. It is up to you to get the results of your test. Ask your health care provider, or the department that is doing the test, when your results will be ready. Summary A coronary calcium scan is an imaging test used to look for deposits of calcium and other fatty materials (plaques) in the inner lining of the blood vessels of the heart (coronary arteries). Generally, this is a safe procedure. Tell your health care provider if you are pregnant or may be pregnant. No preparation is needed for this procedure. A CT scanner will take pictures of your heart. You can return to your normal activities after the scan is done. This information is not intended to replace advice given to you by your health care provider. Make sure you discuss any questions you have with your health care provider. Document Released: 01/10/2008 Document Revised: 06/02/2016 Document Reviewed: 06/02/2016 Elsevier Interactive Patient Education  2017 [place]: At [facility], you and your health needs are our priority.  As part of our continuing mission to provide you with exceptional heart care, we have created designated Provider Care Teams.  These Care Teams include your primary  Cardiologist (physician) and Advanced Practice Providers (APPs -  Physician Assistants and Nurse Practitioners) who all work together to provide you with the care you need, when you need it.  We recommend signing up for the patient portal called "MyChart".  Sign up information is  provided on this After Visit Summary.  MyChart is used to connect with patients for Virtual Visits (Telemedicine).  Patients are able to view lab/test results, encounter notes, upcoming appointments, etc.  Non-urgent messages can be sent to your provider as well.   To learn more about what you can do with MyChart, go to [email or web address].    Your next appointment:   1 year(s)  The format for your next appointment:   In Person  Provider:   K. Mali Hilty, MD

## 2022-05-06 NOTE — Progress Notes (Signed)
OFFICE NOTE  Chief Complaint:  No complaints  Primary Care Physician: Burnis Medin, MD  HPI:  Alfred Johnson is a pleasant 51 year old male who is coming referred to me by Dr. Regis Bill for evaluation of coronary disease. His past history significant for diabetes followed by Dr. Loanne Drilling in endocrinology. He does not have hypertension and is not on treatment for any dyslipidemia. His last lipid profile showed an LDL cholesterol 95 with total cholesterol less than 160. He does have a strong family history of heart disease with heart attacks in his father and both grandfathers. He also recently had a brother who had 3 vessel, bypass one month ago. Since that time he's been having some intermittent symptoms with vague, atypical sounding chest pain and occasional lightheadedness. He did have a pneumonia last summer. Fortunately, he is very active. He's been doing significant exercise several days a week including both aerobic and resistance training. He's managed to have no symptoms such as chest pain or worsening shortness of breath during these exercises.  Alfred Johnson returns today for follow-up. He underwent a CT coronary artery calcium score. This resulted back is 0. Unfortunately he was noted to have some small pulmonary nodules. He will need a repeat plain chest CT in one year. He also obtained a cholesterol profile which demonstrated an LDL-P of 1566, LDL-C of 117, HDL 45, triglycerides 64 and total cholesterol 175. Although he has no chronic calcium, the diabetic he should have risk factor modification. We talked about cholesterol medications and he did research with his insurance company as to what medications would be the most cost effective. He is interested in taking Crestor which I think is a good medication.  02/11/2016  Mr. [name] was seen today in follow-up. Over the past year he's done fairly well. His blood sugars however have been elevated and recently he was started on  Invokana in addition to his other diabetes medicines. Blood pressure appears well controlled today. He denies any chest pain or worsening shortness of breath. Of note his PCP recheck his cholesterol November 2016 which showed a total cholesterol of 86, triglycerides 47, HDL 40 and LDL of 36. This represents significant cholesterol control.  02/17/2017  Alfred Johnson returns today for follow-up. Spent a year since I last saw him. As a recap he had an initial coronary artery calcium score which was 0 however subsequently he had a follow-up of a small pulmonary nodule which was found to be stable, but the radiologist indicated there was a small speck of calcium in the LAD. Either this is relevant however since he is diagnosed as diabetic. He is at high risk and therefore will need a strict cholesterol control with a goal LDL-C less than 70. He was initially placed on atorvastatin 40 mg daily however did have side effects including myalgias on that dose. I asked him to reduce the dose to 20 mg daily a says is tolerating this just fine. He reports taking the medicine regularly. Unfortunately has had some weight gain and says that his diet is "horrible". He says that he goes in phases as far as exercise and diet. I told him that we would really want to be aggressive, particularly looking at his lifetime risk, which he found to be funny. He said that no males in his family have lived to be greater than 45 years old. Despite that, he does want to live a longer, healthier life.  03/19/2018  Alfred Johnson is seen today  in follow-up.  Overall he is doing well without complaints.  Denies any chest pain or worsening shortness of breath.  He is due for repeat lipid profile.  He said his hemoglobin A1c is now down to 6.8.  Recently was started on low-dose bromocriptine which may show some additional benefit apparently diabetics.  Unfortunately not been able to lose a lot of weight.  Is been struggling with a meniscal tear in his  knee which is been repaired and will likely need to be repaired again.  12/09/2019  Alfred Johnson seen today in follow-up.  Overall he continues to do well.  He denies any chest pain or worsening shortness of breath.  Recently he has lost about 20 pounds.  He started to become more physically active.  Hemoglobin A1c in January was 8.4 this come down to 7.1.  His LDL was 68.  He needs a refill of his atorvastatin.  Blood pressures well controlled today 121/72.  EKG shows normal sinus rhythm.  05/06/2022  Alfred Johnson continues to do well.  He denies any chest pain or shortness of breath.  Weight has been fairly stable although he does exercise and has been swimming here at the [facility].  He is A1c is 7.2%.  Last LDL cholesterol less than 70.  As mentioned before he had 0 coronary calcium in 2016 but subsequently was noted to have some calcification in the LAD by a chest CT, noncontrast 1 year later.  He has been treated aggressively because of his diabetes however wonders as to whether or not he has developed more evidence of any coronary artery disease.  PMHx:  Past Medical History:  Diagnosis Date   Acute medial meniscus tear of right knee 01/05/2018   ANEMIA DUE TO CHRONIC BLOOD LOSS 09/10/2009   Diabetes mellitus    Elevated BP    GERD (gastroesophageal reflux disease)    HYPERLIPIDEMIA, MIXED 03/23/2007   Hypertension    KIDNEY STONE 02/05/2009   Left lower lobe pneumonia 02/08/2014   VENTRICULAR FUNCTION, DECREASED 06/21/2007    Past Surgical History:  Procedure Laterality Date   COLONOSCOPY WITH PROPOFOL N/A 10/02/2021   Procedure: COLONOSCOPY WITH PROPOFOL;  Surgeon: Lin Landsman, MD;  Location: [place];  Service: Gastroenterology;  Laterality: N/A;   ESOPHAGOGASTRODUODENOSCOPY N/A 10/02/2021   Procedure: ESOPHAGOGASTRODUODENOSCOPY (EGD);  Surgeon: Lin Landsman, MD;  Location: [facility] ENDOSCOPY;  Service: Gastroenterology;  Laterality: N/A;    FAMHx:   Family History  Problem Relation Age of Onset   Graves' disease Mother    Heart attack Father 46       COD   Heart failure Father    Kidney disease Father    Diabetes Brother    HIV Brother    Heart attack Maternal Grandfather    Heart attack Paternal Grandfather    Heart disease Brother 81       CABG    SOCHx:   reports that he has quit smoking. He quit smokeless tobacco use about 29 years ago. He reports that he does not drink alcohol and does not use drugs.  ALLERGIES:  No Known Allergies  ROS: Pertinent items noted in HPI and remainder of comprehensive ROS otherwise negative.  HOME MEDS: Current Outpatient Medications  Medication Sig Dispense Refill   aspirin 81 MG tablet Take 81 mg by mouth daily.     atorvastatin (LIPITOR) 40 MG tablet TAKE 1/2 A TABLET BY MOUTH DAILY. 45 tablet 3   cetirizine (ZYRTEC) 10  MG tablet Take 10 mg by mouth daily.     colesevelam (WELCHOL) 625 MG tablet Take 2 tablets (1,250 mg total) by mouth daily. 180 tablet 3   COVID-19 mRNA bivalent vaccine, Pfizer, (PFIZER COVID-19 VAC BIVALENT) injection Inject into the muscle. 0.3 mL 0   empagliflozin (JARDIANCE) 25 MG TABS tablet TAKE 25 MG BY MOUTH DAILY. 90 tablet 1   glucose blood (ONE TOUCH ULTRA TEST) test strip 1 each by Other route 3 (three) times daily. And lancets 3/day 100 each 11   metFORMIN (GLUCOPHAGE-XR) 500 MG 24 hr tablet Take 4 tablets (2,000 mg total) by mouth daily with breakfast. 360 tablet 1   omeprazole (PRILOSEC) 40 MG capsule Take 1 capsule (40 mg total) by mouth daily before breakfast. 30 capsule 3   pioglitazone (ACTOS) 45 MG tablet Take 1 tablet by mouth once daily 90 tablet 3   Semaglutide, 2 MG/DOSE, (OZEMPIC, 2 MG/DOSE,) 8 MG/3ML SOPN Inject 2 mg into the skin once a week. 9 mL 2   No current facility-administered medications for this visit.    LABS/IMAGING: No results found for this or any previous visit (from the past 48 hour(s)). No results found.  WEIGHTS: Wt  Readings from Last 3 Encounters:  05/06/22 278 lb 3.2 oz (126.2 kg)  03/17/22 273 lb 9.6 oz (124.1 kg)  10/07/21 275 lb (124.7 kg)    VITALS: BP 115/76   Pulse 91   Ht '5\' 10"'$  (1.778 m)   Wt 278 lb 3.2 oz (126.2 kg)   PF 95 L/min   BMI 39.92 kg/m   EXAM: General appearance: alert, no distress and moderately obese Neck: no carotid bruit, no JVD and thyroid not enlarged, symmetric, no tenderness/mass/nodules Lungs: clear to auscultation bilaterally Heart: regular rate and rhythm, S1, S2 normal, no murmur, click, rub or gallop Abdomen: soft, non-tender; bowel sounds normal; no masses,  no organomegaly Extremities: extremities normal, atraumatic, no cyanosis or edema Pulses: 2+ and symmetric Skin: Skin color, texture, turgor normal. No rashes or lesions Neurologic: Grossly normal Psych: Pleasant  EKG: Normal sinus rhythm 86-personally reviewed  ASSESSMENT: Atypical chest pain - Zero CAC (10/2014), but 2017 CT showed LAD calcium Strong family history of coronary disease Type 2 diabetes Obesity Dyslipidemia, goal LDL less than 70. Solitary pulmonary nodule - benign  PLAN: 1.   Mr. Schmader seems to be doing well without any new chest pain or worsening shortness of breath.  His last calcium score in 2016 was 0 but a subsequent chest CT suggested there might be some coronary calcium.  This was 6 to 7 years ago and I think would be reasonable to consider repeating at this point.  He is in agreement with that.  Otherwise will not make any changes to his medical therapy.  He should have a repeat lipid with his primary care provider visit coming up in 3 weeks.  Follow-up with me annually or sooner as necessary.  Pixie Casino, MD, [facility], [place] Director of the [facility] Lipid [facility]  Cardiovascular [facility] Diplomate of the American Board of Clinical Lipidology Attending Cardiologist  Direct Dial: [phone]  Fax:  [phone]  Website:  [email or web address]  Nadean Corwin Gunda Maqueda 05/06/2022, 1:23 PM

## 2022-05-16 ENCOUNTER — Other Ambulatory Visit (HOSPITAL_BASED_OUTPATIENT_CLINIC_OR_DEPARTMENT_OTHER): Payer: Self-pay

## 2022-05-16 MED ORDER — COMIRNATY 30 MCG/0.3ML IM SUSY
PREFILLED_SYRINGE | INTRAMUSCULAR | 0 refills | Status: DC
  Filled 2022-05-16: qty 0.3, 1d supply, fill #0

## 2022-05-21 LAB — HM DIABETES EYE EXAM

## 2022-06-02 ENCOUNTER — Other Ambulatory Visit (HOSPITAL_BASED_OUTPATIENT_CLINIC_OR_DEPARTMENT_OTHER): Payer: Self-pay

## 2022-06-04 ENCOUNTER — Ambulatory Visit (INDEPENDENT_AMBULATORY_CARE_PROVIDER_SITE_OTHER): Payer: 59 | Admitting: Internal Medicine

## 2022-06-04 ENCOUNTER — Encounter: Payer: Self-pay | Admitting: Internal Medicine

## 2022-06-04 VITALS — BP 126/88 | HR 85 | Temp 97.6°F | Ht 68.0 in | Wt 277.8 lb

## 2022-06-04 DIAGNOSIS — Z0000 Encounter for general adult medical examination without abnormal findings: Secondary | ICD-10-CM | POA: Diagnosis not present

## 2022-06-04 DIAGNOSIS — E782 Mixed hyperlipidemia: Secondary | ICD-10-CM

## 2022-06-04 DIAGNOSIS — Z79899 Other long term (current) drug therapy: Secondary | ICD-10-CM

## 2022-06-04 DIAGNOSIS — I10 Essential (primary) hypertension: Secondary | ICD-10-CM | POA: Diagnosis not present

## 2022-06-04 DIAGNOSIS — Z125 Encounter for screening for malignant neoplasm of prostate: Secondary | ICD-10-CM | POA: Diagnosis not present

## 2022-06-04 DIAGNOSIS — E1165 Type 2 diabetes mellitus with hyperglycemia: Secondary | ICD-10-CM

## 2022-06-04 LAB — BASIC METABOLIC PANEL
BUN: 17 mg/dL (ref 6–23)
CO2: 29 mEq/L (ref 19–32)
Calcium: 9.6 mg/dL (ref 8.4–10.5)
Chloride: 102 mEq/L (ref 96–112)
Creatinine, Ser: 0.76 mg/dL (ref 0.40–1.50)
GFR: 102.85 mL/min (ref >60.00)
Glucose, Bld: 123 mg/dL — ABNORMAL HIGH (ref 70–99)
Potassium: 4.3 mEq/L (ref 3.5–5.1)
Sodium: 139 mEq/L (ref 135–145)

## 2022-06-04 LAB — CBC WITH DIFFERENTIAL/PLATELET
Basophils Absolute: 0.1 10*3/uL (ref 0.0–0.1)
Basophils Relative: 0.7 % (ref 0.0–3.0)
Eosinophils Absolute: 0.2 10*3/uL (ref 0.0–0.7)
Eosinophils Relative: 2.8 % (ref 0.0–5.0)
HCT: 48.3 % (ref 39.0–52.0)
Hemoglobin: 15.8 g/dL (ref 13.0–17.0)
Lymphocytes Relative: 26.7 % (ref 12.0–46.0)
Lymphs Abs: 2.3 10*3/uL (ref 0.7–4.0)
MCHC: 32.8 g/dL (ref 30.0–36.0)
MCV: 88.2 fl (ref 78.0–100.0)
Monocytes Absolute: 0.8 10*3/uL (ref 0.1–1.0)
Monocytes Relative: 8.9 % (ref 3.0–12.0)
Neutro Abs: 5.3 10*3/uL (ref 1.4–7.7)
Neutrophils Relative %: 60.9 % (ref 43.0–77.0)
Platelets: 246 10*3/uL (ref 150.0–400.0)
RBC: 5.47 Mil/uL (ref 4.22–5.81)
RDW: 14.6 % (ref 11.5–15.5)
WBC: 8.6 10*3/uL (ref 4.0–10.5)

## 2022-06-04 LAB — HEPATIC FUNCTION PANEL
ALT: 19 U/L (ref 0–53)
AST: 15 U/L (ref 0–37)
Albumin: 4.5 g/dL (ref 3.5–5.2)
Alkaline Phosphatase: 72 U/L (ref 39–117)
Bilirubin, Direct: 0.1 mg/dL (ref 0.0–0.3)
Total Bilirubin: 0.4 mg/dL (ref 0.2–1.2)
Total Protein: 6.9 g/dL (ref 6.0–8.3)

## 2022-06-04 LAB — MICROALBUMIN / CREATININE URINE RATIO
Creatinine,U: 69.6 mg/dL
Microalb Creat Ratio: 1.1 mg/g (ref 0.0–30.0)
Microalb, Ur: 0.8 mg/dL (ref 0.0–1.9)

## 2022-06-04 LAB — LIPID PANEL
Cholesterol: 116 mg/dL (ref 0–200)
HDL: 42.5 mg/dL (ref >39.00)
LDL Cholesterol: 62 mg/dL (ref 0–99)
NonHDL: 73.52
Total CHOL/HDL Ratio: 3
Triglycerides: 58 mg/dL (ref 0.0–149.0)
VLDL: 11.6 mg/dL (ref 0.0–40.0)

## 2022-06-04 LAB — TSH: TSH: 1.29 u[IU]/mL (ref 0.35–5.50)

## 2022-06-04 LAB — PSA: PSA: 0.46 ng/mL (ref 0.10–4.00)

## 2022-06-04 NOTE — Progress Notes (Signed)
Chief Complaint  Patient presents with   Annual Exam    HPI: Patient  Alfred Johnson  51 y.o. comes in today for [place] visit   Under care endo for DM :relatively  controlled on ozempic  but helps bg but not weght at this time.  Working on [employer] . Cards :  to have a new ct cad scn  ht lipids  No new concerns    Health Maintenance  Topic Date Due   HIV Screening  Never done   OPHTHALMOLOGY EXAM  05/15/2021   Diabetic kidney evaluation - GFR measurement  05/27/2022   Diabetic kidney evaluation - Urine ACR  05/27/2022   COVID-19 Vaccine (3 - Pfizer risk series) 06/20/2022 (Originally 06/27/2021)   HEMOGLOBIN A1C  09/17/2022   FOOT EXAM  02/04/2023   TETANUS/TDAP  06/07/2025   COLONOSCOPY (Pts 45-53yr Insurance coverage will need to be confirmed)  10/03/2026   INFLUENZA VACCINE  Completed   Hepatitis C Screening  Completed   Zoster Vaccines- Shingrix  Completed   HPV VACCINES  Aged Out   Health Maintenance Review LIFESTYLE:  Exercise:  active as possible gyne 3 x per week. Tobacco/ETS:n Alcohol: ocass Sugar beverages:n Sleep:8 Drug use: no HH of  3 son age 53  ROS:  REST of 12 system review negative except as per HPI no major change cv pulm vision neuro   Past Medical History:  Diagnosis Date   Acute medial meniscus tear of right knee 01/05/2018   ANEMIA DUE TO CHRONIC BLOOD LOSS 09/10/2009   Diabetes mellitus    Elevated BP    GERD (gastroesophageal reflux disease)    HYPERLIPIDEMIA, MIXED 03/23/2007   Hypertension    KIDNEY STONE 02/05/2009   Left lower lobe pneumonia 02/08/2014   VENTRICULAR FUNCTION, DECREASED 06/21/2007    Past Surgical History:  Procedure Laterality Date   COLONOSCOPY WITH PROPOFOL N/A 10/02/2021   Procedure: COLONOSCOPY WITH PROPOFOL;  Surgeon: VLin Landsman MD;  [place]  Service: Gastroenterology;  Laterality: N/A;   ESOPHAGOGASTRODUODENOSCOPY N/A 10/02/2021   Procedure:  ESOPHAGOGASTRODUODENOSCOPY (EGD);  Surgeon: VLin Landsman MD;  [facility];  Service: Gastroenterology;  Laterality: N/A;    Family History  Problem Relation Age of Onset   Graves' disease Mother    Heart attack Father 573      COD   Heart failure Father    Kidney disease Father    Diabetes Brother    HIV Brother    Heart attack Maternal Grandfather    Heart attack Paternal Grandfather    Heart disease Brother 488      CABG    Social History   Socioeconomic History   Marital status: Married    Spouse name: Not on file   Number of children: Not on file   Years of education: Not on file   Highest education level: Not on file  Occupational History    Employer: SELF-EMPLOYED    Comment: Does not work outside the home  Tobacco Use   Smoking status: Former   Smokeless tobacco: Former    Quit date: 07/28/1992  Vaping Use   Vaping Use: Never used  Substance and Sexual Activity   Alcohol use: No   Drug use: No   Sexual activity: Not on file  Other Topics Concern   Not on file  Social History Narrative   HH of 3   No pets   Firearms locked away  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    Social Determinants of Health   Financial Resource Strain: Not on file  Food Insecurity: Not on file  Transportation Needs: Not on file  Physical Activity: Not on file  Stress: Not on file  Social Connections: Not on file    Outpatient Medications Prior to Visit  Medication Sig Dispense Refill   aspirin 81 MG tablet Take 81 mg by mouth daily.     atorvastatin (LIPITOR) 40 MG tablet TAKE 1/2 A TABLET BY MOUTH DAILY. 45 tablet 3   cetirizine (ZYRTEC) 10 MG tablet Take 10 mg by mouth daily.     colesevelam (WELCHOL) 625 MG tablet Take 2 tablets (1,250 mg total) by mouth daily. 180 tablet 3   COVID-19 mRNA bivalent vaccine, Pfizer, (PFIZER COVID-19 VAC BIVALENT) injection Inject into  the muscle. 0.3 mL 0   COVID-19 mRNA vaccine 2023-2024 (COMIRNATY) syringe Inject into the muscle. 0.3 mL 0   empagliflozin (JARDIANCE) 25 MG TABS tablet TAKE 25 MG BY MOUTH DAILY. 90 tablet 1   glucose blood (ONE TOUCH ULTRA TEST) test strip 1 each by Other route 3 (three) times daily. And lancets 3/day 100 each 11   metFORMIN (GLUCOPHAGE-XR) 500 MG 24 hr tablet Take 4 tablets (2,000 mg total) by mouth daily with breakfast. 360 tablet 1   pioglitazone (ACTOS) 45 MG tablet Take 1 tablet by mouth once daily 90 tablet 3   Semaglutide, 2 MG/DOSE, (OZEMPIC, 2 MG/DOSE,) 8 MG/3ML SOPN Inject 2 mg into the skin once a week. 9 mL 2   Semaglutide, 2 MG/DOSE, (OZEMPIC, 2 MG/DOSE,) 8 MG/3ML SOPN Inject 2 mg into the skin once a week. 9 mL 2   omeprazole (PRILOSEC) 40 MG capsule Take 1 capsule (40 mg total) by mouth daily before breakfast. 30 capsule 3   No facility-administered medications prior to visit.     EXAM:  BP 126/88 (BP Location: Right Arm, Patient Position: Sitting, Cuff Size: Large)   Pulse 85   Temp 97.6 F (36.4 C) (Oral)   Ht '5\' 8"'$  (1.727 m)   Wt 277 lb 12.8 oz (126 kg)   SpO2 92%   BMI 42.24 kg/m   Body mass index is 42.24 kg/m. Wt Readings from Last 3 Encounters:  06/04/22 277 lb 12.8 oz (126 kg)  05/06/22 278 lb 3.2 oz (126.2 kg)  03/17/22 273 lb 9.6 oz (124.1 kg)    Physical Exam: Vital signs reviewed DVV:OHYW is a well-developed well-nourished alert cooperative    who appearsr stated age in no acute distress.  HEENT: normocephalic atraumatic , Eyes: PERRL EOM's full, conjunctiva clear, Nares: paten,t no deformity discharge or tenderness., Ears: no deformity EAC's clear TMs with normal landmarks. Mouth: clear OP, no lesions, edema.  Moist mucous membranes. Dentition in adequate repair. NECK: supple without masses, thyromegaly or bruits. CHEST/PULM:  Clear to auscultation and percussion breath sounds equal no wheeze , rales or rhonchi. No chest wall deformities or  tenderness. CV: PMI is nondisplaced, S1 S2 no gallops, murmurs, rubs. Peripheral pulses are full without delay.No JVD .  ABDOMEN: Bowel sounds normal nontender  No guard or rebound, no hepato splenomegal no CVA tenderness.  . Extremtities:  No clubbing cyanosis or edema, no acute joint swelling or redness no focal atrophy NEURO:  Oriented x3, cranial nerves 3-12 appear to be intact, no obvious focal weakness,gait  within normal limits no abnormal reflexes or asymmetrical SKIN: No acute rashes normal turgor, color, no bruising or petechiae. PSYCH: Oriented, good eye contact, no obvious depression anxiety, cognition and judgment appear normal. LN: no cervical axillary adenopathy Feet wthout callous or ulcer  pulses present. Lab Results  Component Value Date   WBC 7.9 05/27/2021   HGB 15.8 05/27/2021   HCT 48.7 05/27/2021   PLT 273.0 05/27/2021   GLUCOSE 141 (H) 05/27/2021   CHOL 114 05/27/2021   TRIG 55.0 05/27/2021   HDL 42.60 05/27/2021   LDLDIRECT 114.5 02/26/2007   LDLCALC 61 05/27/2021   ALT 21 05/27/2021   AST 13 05/27/2021   NA 141 05/27/2021   K 4.7 05/27/2021   CL 105 05/27/2021   CREATININE 0.84 05/27/2021   BUN 14 05/27/2021   CO2 26 05/27/2021   TSH 1.93 05/27/2021   PSA 0.47 05/27/2021   HGBA1C 7.2 (A) 03/17/2022   MICROALBUR 1.6 05/27/2021    BP Readings from Last 3 Encounters:  06/04/22 126/88  05/06/22 115/76  03/17/22 104/66    Lab plan reviewed with patient   ASSESSMENT AND PLAN:  Discussed the following assessment and plan:    ICD-10-CM   1. Visit for preventive health examination  K80.03 Basic metabolic panel    CBC with Differential/Platelet    Hepatic function panel    Lipid panel    PSA    TSH    Microalbumin / creatinine urine ratio    Microalbumin / creatinine urine ratio    TSH    PSA    Lipid panel    Hepatic function panel    CBC with Differential/Platelet    Basic metabolic panel    2. Prostate cancer screening  K91.7 Basic  metabolic panel    CBC with Differential/Platelet    Hepatic function panel    Lipid panel    PSA    TSH    Microalbumin / creatinine urine ratio    Microalbumin / creatinine urine ratio    TSH    PSA    Lipid panel    Hepatic function panel    CBC with Differential/Platelet    Basic metabolic panel    3. Essential hypertension  H15 Basic metabolic panel    CBC with Differential/Platelet    Hepatic function panel    Lipid panel    PSA    TSH    Microalbumin / creatinine urine ratio    Microalbumin / creatinine urine ratio    TSH    PSA    Lipid panel    Hepatic function panel    CBC with Differential/Platelet    Basic metabolic panel    4. Type 2 diabetes mellitus with hyperglycemia, without long-term current use of insulin (HCC)  A56.97 Basic metabolic panel    CBC with Differential/Platelet    Hepatic function panel    Lipid panel    PSA    TSH    Microalbumin / creatinine urine ratio    Microalbumin / creatinine urine ratio    TSH    PSA    Lipid panel    Hepatic function panel    CBC with Differential/Platelet    Basic metabolic panel    5. Mixed hyperlipidemia  X48.0 Basic metabolic panel    CBC with Differential/Platelet    Hepatic function panel    Lipid panel    PSA    TSH    Microalbumin / creatinine urine ratio  Microalbumin / creatinine urine ratio    TSH    PSA    Lipid panel    Hepatic function panel    CBC with Differential/Platelet    Basic metabolic panel    6. Medication management  Y60.600 Basic metabolic panel    CBC with Differential/Platelet    Hepatic function panel    Lipid panel    PSA    TSH    Microalbumin / creatinine urine ratio    Microalbumin / creatinine urine ratio    TSH    PSA    Lipid panel    Hepatic function panel    CBC with Differential/Platelet    Basic metabolic panel    Interesting that he has not had weight loss expected on semaglutide  ? If dose related ? Marland Kitchen Disc options . To follow  up endo  . Return in about 1 year (around 06/05/2023) for depending on results.  Patient Care Team: Huxley Shurley, Standley Brooking, MD as PCP - General Hilty, Nadean Corwin, MD as PCP - Cardiology (Cardiology) Renato Shin, MD (Inactive) as Attending Physician (Internal Medicine) Calvert Cantor, MD (Ophthalmology) Hayden Pedro, MD as Consulting Physician (Ophthalmology) Debara Pickett Nadean Corwin, MD as Consulting Physician (Cardiology) Starlyn Skeans as Physician Assistant (Dermatology) Philemon Kingdom, MD as Consulting Physician (Internal Medicine) Patient Instructions  Good to see you today . Will share results of lab  to your medical team   Standley Brooking. Laveyah Oriol M.D.

## 2022-06-04 NOTE — Patient Instructions (Signed)
Good to see you today . Will share results of lab  to your medical team

## 2022-06-05 NOTE — Progress Notes (Signed)
Results normal except BG n(ot unexpected) LDL at goal. Forwarding  to  medical team

## 2022-06-08 ENCOUNTER — Other Ambulatory Visit: Payer: Self-pay

## 2022-06-08 ENCOUNTER — Other Ambulatory Visit: Payer: Self-pay | Admitting: Gastroenterology

## 2022-06-08 DIAGNOSIS — K219 Gastro-esophageal reflux disease without esophagitis: Secondary | ICD-10-CM

## 2022-06-08 DIAGNOSIS — R142 Eructation: Secondary | ICD-10-CM

## 2022-06-09 ENCOUNTER — Other Ambulatory Visit (HOSPITAL_BASED_OUTPATIENT_CLINIC_OR_DEPARTMENT_OTHER): Payer: Self-pay

## 2022-06-09 ENCOUNTER — Other Ambulatory Visit: Payer: Self-pay | Admitting: Endocrinology

## 2022-06-09 DIAGNOSIS — E119 Type 2 diabetes mellitus without complications: Secondary | ICD-10-CM

## 2022-06-09 MED ORDER — EMPAGLIFLOZIN 25 MG PO TABS
25.0000 mg | ORAL_TABLET | Freq: Every day | ORAL | 1 refills | Status: DC
  Filled 2022-06-09: qty 90, 90d supply, fill #0
  Filled 2022-08-21: qty 30, 30d supply, fill #1
  Filled 2022-10-02: qty 30, 30d supply, fill #2
  Filled 2022-11-01: qty 30, 30d supply, fill #3

## 2022-06-09 MED ORDER — OMEPRAZOLE 40 MG PO CPDR
40.0000 mg | DELAYED_RELEASE_CAPSULE | Freq: Every day | ORAL | 3 refills | Status: DC
  Filled 2022-06-09: qty 30, 30d supply, fill #0
  Filled 2022-06-30: qty 30, 30d supply, fill #1
  Filled 2022-08-01: qty 30, 30d supply, fill #2
  Filled 2022-09-01: qty 30, 30d supply, fill #3

## 2022-06-09 MED ORDER — METFORMIN HCL ER 500 MG PO TB24
2000.0000 mg | ORAL_TABLET | Freq: Every day | ORAL | 1 refills | Status: DC
  Filled 2022-06-09: qty 360, 90d supply, fill #0
  Filled 2022-09-01: qty 120, 30d supply, fill #1
  Filled 2022-10-02: qty 120, 30d supply, fill #2
  Filled 2022-11-01: qty 120, 30d supply, fill #3

## 2022-06-09 NOTE — Telephone Encounter (Signed)
Last office visit 09/09/2021  Plan: requent acid belching, nausea and vomiting Suspect patient may have diabetic gastroparesis Recommend EGD for further evaluation Increase omeprazole to 40 mg once daily before meals for 1 month Discussed about cut back on red meat from 3 times a week to once every 2 weeks Recommend gastric emptying study if EGD is unremarkable  EGD 10/02/2021   Last refill 01/10/2022 3 refills

## 2022-06-10 ENCOUNTER — Encounter (HOSPITAL_BASED_OUTPATIENT_CLINIC_OR_DEPARTMENT_OTHER): Payer: Self-pay | Admitting: Internal Medicine

## 2022-06-10 ENCOUNTER — Ambulatory Visit (HOSPITAL_BASED_OUTPATIENT_CLINIC_OR_DEPARTMENT_OTHER)
Admission: RE | Admit: 2022-06-10 | Discharge: 2022-06-10 | Disposition: A | Discharge status: Home / Self Care | Payer: 59 | Source: Ambulatory Visit | Attending: Internal Medicine | Admitting: Internal Medicine

## 2022-06-10 DIAGNOSIS — R931 Abnormal findings on diagnostic imaging of heart and coronary circulation: Secondary | ICD-10-CM

## 2022-06-10 DIAGNOSIS — E119 Type 2 diabetes mellitus without complications: Secondary | ICD-10-CM | POA: Insufficient documentation

## 2022-06-10 DIAGNOSIS — E785 Hyperlipidemia, unspecified: Secondary | ICD-10-CM | POA: Insufficient documentation

## 2022-06-12 LAB — LIPOPROTEIN A (LPA): Lipoprotein (a): 81.9 nmol/L — ABNORMAL HIGH (ref <75.0)

## 2022-06-17 ENCOUNTER — Ambulatory Visit (INDEPENDENT_AMBULATORY_CARE_PROVIDER_SITE_OTHER): Payer: 59

## 2022-06-17 ENCOUNTER — Ambulatory Visit (INDEPENDENT_AMBULATORY_CARE_PROVIDER_SITE_OTHER): Payer: 59 | Admitting: Orthopaedic Surgery

## 2022-06-17 ENCOUNTER — Encounter: Payer: Self-pay | Admitting: Orthopaedic Surgery

## 2022-06-17 DIAGNOSIS — M25562 Pain in left knee: Secondary | ICD-10-CM

## 2022-06-17 NOTE — Progress Notes (Signed)
Office Visit Note   Patient: Alfred Johnson           Date of Birth: Apr 11, 1971           MRN: [id number] Visit Date: 06/17/2022              Requested by: Burnis Medin, MD [place],  [place] [address] PCP: Burnis Medin, MD   Assessment & Plan: Visit Diagnoses:  1. Acute pain of left knee     Plan: Impression is acute left knee pain.  Given the injury we will need an MRI to assess for medial meniscus tear and rule out fracture.  Offered cortisone injection but he has had those in the past and his other knee and has not had any relief from them.  We will see him back after the MRI.  Follow-Up Instructions: No follow-ups on file.   Orders:  Orders Placed This Encounter  Procedures   XR KNEE 3 VIEW LEFT   MR Knee Left w/o contrast   No orders of the defined types were placed in this encounter.     Procedures: No procedures performed   Clinical Data: No additional findings.   Subjective: Chief Complaint  Patient presents with   Left Knee - Pain    HPI [name] is a 51 year old gentleman who I have not seen in over 3 years comes in with for evaluation of a left knee injury from about 10 days ago.  He was in his laundry room when he fell and twisted his left knee.  He has been unable to weight-bear since then.  Date of injury was November 11.  Going downhill is extremely painful.  He has been wearing a compression knee sleeve.  Endorses swelling.  Review of Systems  Constitutional: Negative.   All other systems reviewed and are negative.    Objective: Vital Signs: There were no vitals taken for this visit.  Physical Exam Vitals and nursing note reviewed.  Constitutional:      Appearance: He is well-developed.  HENT:     Head: Normocephalic and atraumatic.  Eyes:     Pupils: Pupils are equal, round, and reactive to light.  Pulmonary:     Effort: Pulmonary effort is normal.  Abdominal:     Palpations: Abdomen is soft.   Musculoskeletal:        General: Normal range of motion.     Cervical back: Neck supple.  Skin:    General: Skin is warm.  Neurological:     Mental Status: He is alert and oriented to person, place, and time.  Psychiatric:        Behavior: Behavior normal.        Thought Content: Thought content normal.        Judgment: Judgment normal.     Ortho Exam Examination of the left knee shows medial joint line tenderness and tenderness to the medial tibial plateau.  Collaterals and cruciates are stable.  Trace effusion.  He has pain and guarding to range of motion. Specialty Comments:  No specialty comments available.  Imaging: XR KNEE 3 VIEW LEFT  Result Date: 06/17/2022 Well-preserved joint spaces.  No acute or structural abnormalities.    PMFS History: Patient Active Problem List   Diagnosis Date Noted   Colon cancer screening    Polyp of descending colon    S/P right knee arthroscopy 04/15/2018   Lung nodule 02/11/2016   Diabetes mellitus without complication ([place]) 35/46/5681  BMI 35.0-35.9,adult 06/01/2012   Visit for preventive health examination 06/01/2012   Cough 07/09/2010   WEIGHT GAIN 09/10/2009   KIDNEY STONE 02/05/2009   Diabetes ([place]) 03/23/2007   HYPERLIPIDEMIA, MIXED 03/23/2007   OBESITY NOS 02/25/2007   Essential hypertension 02/25/2007   Hiatal hernia with GERD without esophagitis 02/11/2007   Past Medical History:  Diagnosis Date   Acute medial meniscus tear of right knee 01/05/2018   ANEMIA DUE TO CHRONIC BLOOD LOSS 09/10/2009   Diabetes mellitus    Elevated BP    GERD (gastroesophageal reflux disease)    HYPERLIPIDEMIA, MIXED 03/23/2007   Hypertension    KIDNEY STONE 02/05/2009   Left lower lobe pneumonia 02/08/2014   VENTRICULAR FUNCTION, DECREASED 06/21/2007    Family History  Problem Relation Age of Onset   Graves' disease Mother    Heart attack Father 60       COD   Heart failure Father    Kidney disease Father    Diabetes Brother     HIV Brother    Heart attack Maternal Grandfather    Heart attack Paternal Grandfather    Heart disease Brother 42       CABG    Past Surgical History:  Procedure Laterality Date   COLONOSCOPY WITH PROPOFOL N/A 10/02/2021   Procedure: COLONOSCOPY WITH PROPOFOL;  Surgeon: Lin Landsman, MD;  Location: [facility] ENDOSCOPY;  Service: Gastroenterology;  Laterality: N/A;   ESOPHAGOGASTRODUODENOSCOPY N/A 10/02/2021   Procedure: ESOPHAGOGASTRODUODENOSCOPY (EGD);  Surgeon: Lin Landsman, MD;  Location[facility] ENDOSCOPY;  Service: Gastroenterology;  Laterality: N/A;   Social History   Occupational History    Employer: SELF-EMPLOYED    Comment: Does not work outside the home  Tobacco Use   Smoking status: Former   Smokeless tobacco: Former    Quit date: 07/28/1992  Vaping Use   Vaping Use: Never used  Substance and Sexual Activity   Alcohol use: No   Drug use: No   Sexual activity: Not on file

## 2022-06-22 ENCOUNTER — Ambulatory Visit
Admission: RE | Admit: 2022-06-22 | Discharge: 2022-06-22 | Disposition: A | Discharge status: Home / Self Care | Payer: 59 | Source: Ambulatory Visit | Attending: Orthopaedic Surgery | Admitting: Orthopaedic Surgery

## 2022-06-22 DIAGNOSIS — M25562 Pain in left knee: Secondary | ICD-10-CM

## 2022-06-25 ENCOUNTER — Other Ambulatory Visit (HOSPITAL_BASED_OUTPATIENT_CLINIC_OR_DEPARTMENT_OTHER): Payer: Self-pay

## 2022-06-26 ENCOUNTER — Ambulatory Visit: Payer: 59 | Admitting: Orthopaedic Surgery

## 2022-06-26 ENCOUNTER — Ambulatory Visit (INDEPENDENT_AMBULATORY_CARE_PROVIDER_SITE_OTHER): Payer: 59 | Admitting: Orthopaedic Surgery

## 2022-06-26 DIAGNOSIS — S83242A Other tear of medial meniscus, current injury, left knee, initial encounter: Secondary | ICD-10-CM

## 2022-06-26 NOTE — Progress Notes (Signed)
Office Visit Note   Patient: Alfred Johnson           Date of Birth: 19-Oct-1970           MRN: [id number] Visit Date: 06/26/2022              Requested by: Burnis Medin, MD [place],  [place] [address] PCP: Burnis Medin, MD   Assessment & Plan: Visit Diagnoses:  1. Acute medial meniscus tear of left knee, initial encounter     Plan: MRI of the left knee shows oblique inferior surface medial meniscal tear.  The tear does go out to the surface.  These findings were reviewed and I have recommended partial medial meniscectomy.  Patient would like to have this done ASAP.  Jackelyn Poling will meet with the patient today to confirm a surgery date.  Follow-Up Instructions: No follow-ups on file.   Orders:  No orders of the defined types were placed in this encounter.  No orders of the defined types were placed in this encounter.     Procedures: No procedures performed   Clinical Data: No additional findings.   Subjective: Chief Complaint  Patient presents with   Left Knee - Follow-up    HPI  Mr. Zarrella returns today to discuss left knee MRI scan.  Review of Systems  Constitutional: Negative.   HENT: Negative.    Eyes: Negative.   Respiratory: Negative.    Cardiovascular: Negative.   Gastrointestinal: Negative.   Endocrine: Negative.   Genitourinary: Negative.   Skin: Negative.   Allergic/Immunologic: Negative.   Neurological: Negative.   Hematological: Negative.   Psychiatric/Behavioral: Negative.    All other systems reviewed and are negative.    Objective: Vital Signs: There were no vitals taken for this visit.  Physical Exam Vitals and nursing note reviewed.  Constitutional:      Appearance: He is well-developed.  Pulmonary:     Effort: Pulmonary effort is normal.  Abdominal:     Palpations: Abdomen is soft.  Skin:    General: Skin is warm.  Neurological:     Mental Status: He is alert and oriented to person, place, and  time.  Psychiatric:        Behavior: Behavior normal.        Thought Content: Thought content normal.        Judgment: Judgment normal.     Ortho Exam Examination of the left knee shows medial joint line tenderness.  Trace effusion.  Pain with McMurray maneuver. Specialty Comments:  No specialty comments available.  Imaging: No results found.   PMFS History: Patient Active Problem List   Diagnosis Date Noted   Acute medial meniscus tear of left knee 06/26/2022   Colon cancer screening    Polyp of descending colon    S/P right knee arthroscopy 04/15/2018   Lung nodule 02/11/2016   Diabetes mellitus without complication ([place]) 60/63/0160   BMI 35.0-35.9,adult 06/01/2012   Visit for preventive health examination 06/01/2012   Cough 07/09/2010   WEIGHT GAIN 09/10/2009   KIDNEY STONE 02/05/2009   Diabetes ([place]) 03/23/2007   HYPERLIPIDEMIA, MIXED 03/23/2007   OBESITY NOS 02/25/2007   Essential hypertension 02/25/2007   Hiatal hernia with GERD without esophagitis 02/11/2007   Past Medical History:  Diagnosis Date   Acute medial meniscus tear of right knee 01/05/2018   ANEMIA DUE TO CHRONIC BLOOD LOSS 09/10/2009   Diabetes mellitus    Elevated BP    GERD (gastroesophageal  reflux disease)    HYPERLIPIDEMIA, MIXED 03/23/2007   Hypertension    KIDNEY STONE 02/05/2009   Left lower lobe pneumonia 02/08/2014   VENTRICULAR FUNCTION, DECREASED 06/21/2007    Family History  Problem Relation Age of Onset   Graves' disease Mother    Heart attack Father 38       COD   Heart failure Father    Kidney disease Father    Diabetes Brother    HIV Brother    Heart attack Maternal Grandfather    Heart attack Paternal Grandfather    Heart disease Brother 50       CABG    Past Surgical History:  Procedure Laterality Date   COLONOSCOPY WITH PROPOFOL N/A 10/02/2021   Procedure: COLONOSCOPY WITH PROPOFOL;  Surgeon: Lin Landsman, MD;  Location: [facility] ENDOSCOPY;  Service:  Gastroenterology;  Laterality: N/A;   ESOPHAGOGASTRODUODENOSCOPY N/A 10/02/2021   Procedure: ESOPHAGOGASTRODUODENOSCOPY (EGD);  Surgeon: Lin Landsman, MD;  Location: [facility] ENDOSCOPY;  Service: Gastroenterology;  Laterality: N/A;   Social History   Occupational History    Employer: SELF-EMPLOYED    Comment: Does not work outside the home  Tobacco Use   Smoking status: Former   Smokeless tobacco: Former    Quit date: 07/28/1992  Vaping Use   Vaping Use: Never used  Substance and Sexual Activity   Alcohol use: No   Drug use: No   Sexual activity: Not on file

## 2022-06-30 ENCOUNTER — Other Ambulatory Visit (HOSPITAL_BASED_OUTPATIENT_CLINIC_OR_DEPARTMENT_OTHER): Payer: Self-pay

## 2022-07-02 ENCOUNTER — Encounter (HOSPITAL_BASED_OUTPATIENT_CLINIC_OR_DEPARTMENT_OTHER): Payer: Self-pay | Admitting: Orthopaedic Surgery

## 2022-07-02 ENCOUNTER — Other Ambulatory Visit: Payer: Self-pay

## 2022-07-03 ENCOUNTER — Encounter (HOSPITAL_BASED_OUTPATIENT_CLINIC_OR_DEPARTMENT_OTHER)
Admission: RE | Admit: 2022-07-03 | Discharge: 2022-07-03 | Disposition: A | Discharge status: Home / Self Care | Payer: 59 | Source: Ambulatory Visit | Attending: Orthopaedic Surgery | Admitting: Orthopaedic Surgery

## 2022-07-03 DIAGNOSIS — Z01818 Encounter for other preprocedural examination: Secondary | ICD-10-CM | POA: Insufficient documentation

## 2022-07-03 NOTE — Progress Notes (Signed)

## 2022-07-08 ENCOUNTER — Encounter: Payer: Self-pay | Admitting: Internal Medicine

## 2022-07-08 ENCOUNTER — Other Ambulatory Visit (HOSPITAL_BASED_OUTPATIENT_CLINIC_OR_DEPARTMENT_OTHER): Payer: Self-pay

## 2022-07-08 ENCOUNTER — Ambulatory Visit (INDEPENDENT_AMBULATORY_CARE_PROVIDER_SITE_OTHER): Payer: 59 | Admitting: Internal Medicine

## 2022-07-08 VITALS — BP 132/86 | HR 85 | Ht 69.0 in | Wt 278.0 lb

## 2022-07-08 DIAGNOSIS — E785 Hyperlipidemia, unspecified: Secondary | ICD-10-CM | POA: Diagnosis not present

## 2022-07-08 DIAGNOSIS — E1165 Type 2 diabetes mellitus with hyperglycemia: Secondary | ICD-10-CM | POA: Diagnosis not present

## 2022-07-08 LAB — POCT GLYCOSYLATED HEMOGLOBIN (HGB A1C): Hemoglobin A1C: 7.7 % — AB (ref 4.0–5.6)

## 2022-07-08 MED ORDER — GLUCOSE BLOOD VI STRP
1.0000 | ORAL_STRIP | Freq: Three times a day (TID) | 3 refills | Status: DC
  Filled 2022-07-08: qty 100, 34d supply, fill #0
  Filled 2022-07-20: qty 200, 67d supply, fill #0
  Filled 2022-11-22: qty 50, 17d supply, fill #1

## 2022-07-08 MED ORDER — ONETOUCH DELICA PLUS LANCET33G MISC
3 refills | Status: DC
  Filled 2022-07-08: qty 100, 34d supply, fill #0
  Filled 2022-07-20: qty 200, 67d supply, fill #0
  Filled 2022-11-22: qty 100, 30d supply, fill #1

## 2022-07-08 MED ORDER — FREESTYLE LIBRE 3 SENSOR MISC
1.0000 | 3 refills | Status: DC
  Filled 2022-07-08: qty 2, 28d supply, fill #0

## 2022-07-08 MED ORDER — OZEMPIC (1 MG/DOSE) 4 MG/3ML ~~LOC~~ SOPN
1.0000 mg | PEN_INJECTOR | SUBCUTANEOUS | 3 refills | Status: DC
  Filled 2022-07-08: qty 3, 28d supply, fill #0
  Filled 2022-08-21: qty 3, 28d supply, fill #1

## 2022-07-08 NOTE — Patient Instructions (Addendum)
Please continue: - Metformin ER 2000 mg at dinnertime (set alarm to remind you) - Actos 45 mg daily in am - Welchol 625 x 2 tablets daily - Jardiance 25 mg before breakfast - Ozempic 1 mg weekly (let me know if we need to change to [facility] in 2024)  Please return in 3-4 months.

## 2022-07-08 NOTE — Progress Notes (Signed)
Patient ID: Johnson Alfred, male   DOB: [birth year], 51 y.o.   MRN: [id number]  HPI: Alfred Johnson is a 51 y.o.-year-old male, returning for follow-up for DM2, dx in 2008, non-insulin-dependent, fairly well controlled, without long-term complications. Pt. previously saw Dr. Loanne Drilling, last visit with me 4 months ago. He will change insurance soon: from [place] to [employer].  Interim history He has increased urination, no blurry vision, + occasional nausea (but not with the 1 mg Ozempic dose), no chest pain. He had a torn meniscus >> less active. He has upcoming arthroscopic knee surgery tomorrow.  Reviewed HbA1c: Lab Results  Component Value Date   HGBA1C 7.2 (A) 03/17/2022   HGBA1C 7.1 (A) 10/07/2021   HGBA1C 7.2 (A) 06/05/2021   HGBA1C 7.7 (H) 05/27/2021   HGBA1C 7.5 (A) 02/05/2021   HGBA1C 7.0 (A) 10/02/2020   HGBA1C 7.1 (A) 05/30/2020   HGBA1C 7.2 (A) 02/07/2020   HGBA1C 7.4 (A) 11/01/2019   HGBA1C 8.4 (H) 08/19/2019   Pt is on a regimen of: - Metformin ER 2000 mg in am >> at dinnertime (forgets doses - up to 50%) - Actos 45 mg daily in am - Welchol 625 x 2 tablets daily in am - added in 09/2021 - Jardiance 25 mg before breakfast - increased urination - Ozempic 2 mg weekly - hypersensitivity to foods, nausea, occasional vomiting >> alternating 1 with 2 mg weekly >> 1 mg weekly She previously tried Invokana >> itching. We stopped bromocriptine 02/2022  Pt checks his sugars 1x a day and they are: - am: 140-150 >> 110-115 (when taking Metformin at night) - 2h after b'fast: n/c - before lunch: n/c - h after lunch: 170-180 - before dinner: n/c - 2h after dinner: n/c - bedtime: n/c - nighttime: n/c Lowest sugar was 90s; he has hypoglycemia awareness at 90.  Highest sugar was 190 >> 180.  Glucometer: One Touch ultra  - no CKD, last BUN/creatinine:  Lab Results  Component Value Date   BUN 17 06/04/2022   BUN 14 05/27/2021   CREATININE 0.76 06/04/2022    CREATININE 0.84 05/27/2021  On Jardiance.  -+ HL; last set of lipids: Lab Results  Component Value Date   CHOL 116 06/04/2022   HDL 42.50 06/04/2022   LDLCALC 62 06/04/2022   LDLDIRECT 114.5 02/26/2007   TRIG 58.0 06/04/2022   CHOLHDL 3 06/04/2022  On Lipitor 20 mg daily.  - last eye exam was in 04/2022. No DR reportedly but in 2021: + DR.   - no numbness and tingling in his feet.  Latest foot exam was by Dr. Paulla Dolly 02/03/2022.  He also has a history of kidney stones, GERD, HTN, obesity. Per review of Dr. Cordelia Pen note, he declined weight loss surgery in the past.  He had recent cardiology investigation: LP(a) (06/11/2022) was elevated at81.9, coronary calcium score was also elevated (06/10/2021) at 383.  ROS: + see HPI  Past Medical History:  Diagnosis Date   Acute medial meniscus tear of right knee 01/05/2018   ANEMIA DUE TO CHRONIC BLOOD LOSS 09/10/2009   Diabetes mellitus    Elevated BP    GERD (gastroesophageal reflux disease)    HYPERLIPIDEMIA, MIXED 03/23/2007   Hypertension    KIDNEY STONE 02/05/2009   Left lower lobe pneumonia 02/08/2014   VENTRICULAR FUNCTION, DECREASED 06/21/2007   Past Surgical History:  Procedure Laterality Date   COLONOSCOPY WITH PROPOFOL N/A 10/02/2021   Procedure: COLONOSCOPY WITH PROPOFOL;  Surgeon: Lin Landsman, MD;  Location: [facility] ENDOSCOPY;  Service: Gastroenterology;  Laterality: N/A;   ESOPHAGOGASTRODUODENOSCOPY N/A 10/02/2021   Procedure: ESOPHAGOGASTRODUODENOSCOPY (EGD);  Surgeon: Lin Landsman, MD;  Location: [facility] ENDOSCOPY;  Service: Gastroenterology;  Laterality: N/A;   Social History   Socioeconomic History   Marital status: Married    Spouse name: Not on file   Number of children: Not on file   Years of education: Not on file   Highest education level: Not on file  Occupational History    Employer: SELF-EMPLOYED    Comment: Does not work outside the home  Tobacco Use   Smoking status: Former   Smokeless  tobacco: Former    Quit date: 07/28/1992  Vaping Use   Vaping Use: Never used  Substance and Sexual Activity   Alcohol use: No   Drug use: No   Sexual activity: Not on file  Other Topics Concern   Not on file  Social History Narrative   HH of 3   No pets   Firearms locked away   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    Social Determinants of Health   Financial Resource Strain: Not on file  Food Insecurity: Not on file  Transportation Needs: Not on file  Physical Activity: Not on file  Stress: Not on file  Social Connections: Not on file  Intimate Partner Violence: Not on file   Current Outpatient Medications on File Prior to Visit  Medication Sig Dispense Refill   aspirin 81 MG tablet Take 81 mg by mouth daily.     atorvastatin (LIPITOR) 40 MG tablet TAKE 1/2 A TABLET BY MOUTH DAILY. 45 tablet 3   cetirizine (ZYRTEC) 10 MG tablet Take 10 mg by mouth daily.     colesevelam (WELCHOL) 625 MG tablet Take 2 tablets (1,250 mg total) by mouth daily. 180 tablet 3   COVID-19 mRNA bivalent vaccine, Pfizer, (PFIZER COVID-19 VAC BIVALENT) injection Inject into the muscle. 0.3 mL 0   COVID-19 mRNA vaccine 2023-2024 (COMIRNATY) syringe Inject into the muscle. 0.3 mL 0   empagliflozin (JARDIANCE) 25 MG TABS tablet Take 1 tablet (25 mg total) by mouth daily. 90 tablet 1   glucose blood (ONE TOUCH ULTRA TEST) test strip 1 each by Other route 3 (three) times daily. And lancets 3/day 100 each 11   metFORMIN (GLUCOPHAGE-XR) 500 MG 24 hr tablet Take 4 tablets (2,000 mg total) by mouth daily with breakfast. 360 tablet 1   omeprazole (PRILOSEC) 40 MG capsule Take 1 capsule (40 mg total) by mouth daily before breakfast. 30 capsule 3   pioglitazone (ACTOS) 45 MG tablet Take 1 tablet by mouth once daily 90 tablet 3   Semaglutide, 2 MG/DOSE, (OZEMPIC, 2 MG/DOSE,) 8 MG/3ML SOPN Inject 2 mg into the skin once a week. 9 mL 2    Semaglutide, 2 MG/DOSE, (OZEMPIC, 2 MG/DOSE,) 8 MG/3ML SOPN Inject 2 mg into the skin once a week. 9 mL 2   No current facility-administered medications on file prior to visit.   No Known Allergies Family History  Problem Relation Age of Onset   Graves' disease Mother    Heart attack Father 61       COD   Heart failure Father    Kidney disease Father    Diabetes Brother    HIV Brother    Heart attack Maternal Grandfather    Heart attack  Paternal Grandfather    Heart disease Brother 15       CABG   PE: BP 132/86 (BP Location: Left Arm, Patient Position: Sitting, Cuff Size: Normal)   Pulse 85   Ht '5\' 9"'$  (1.753 m)   Wt 278 lb (126.1 kg)   SpO2 97%   BMI 41.05 kg/m  Wt Readings from Last 3 Encounters:  07/08/22 278 lb (126.1 kg)  06/04/22 277 lb 12.8 oz (126 kg)  05/06/22 278 lb 3.2 oz (126.2 kg)   Constitutional: overweight, in NAD Eyes: no exophthalmos ENT: no thyromegaly, no cervical lymphadenopathy Cardiovascular: RRR, No MRG Respiratory: CTA B Musculoskeletal: no deformities Skin: no rashes Neurological: no tremor with outstretched hands  ASSESSMENT: 1. DM2, non-insulin-dependent, fairly well controlled, without long-term complications  2. HL  PLAN:  1. Patient with longstanding, uncontrolled, type 2 diabetes, on a complex antidiabetic regimen with metformin, TZD, bile acid resin, SGLT2 inhibitor, and also weekly GLP-1 receptor agonist with still suboptimal control.  At last visit, HbA1c was higher, at 7.2%.  At that time, he was only checking sugars in the morning and I advised him to check later in the day, also.  Sugars in the morning are fluctuating usually higher than target.  To improve these, I advised him to move the entire dose of metformin to dinnertime.  I also advised him to stop bromocriptine, which was expensive for him, as I did not feel that this was helping much.  He describes occasional nausea and even vomiting with Ozempic and we discussed about  backing off the dose temporarily.  He did not want to reduce the dose to 1 mg all the time so I advised him to alternate between 1 and 2 mg weekly.  I also advised him to try to eat smaller meals. -At today's visit, he tells me he has been less active recently after he had a meniscal tear.  Knee surgery is pending for tomorrow.  He gained 5 pounds since last visit and his HbA1c is higher (see below).  Per his recall, sugars at home are actually at goal in the morning whenever he takes the metformin as prescribed, entire dose at dinner time.  However, he forgets the doses frequently.  I advised him to set alarms on his phone to remember to take it. -Also, due to nausea, he had to decrease the dose of Ozempic to 1 mg weekly since last visit.  On this dose, no more nausea.  He is interested in trying [place], but only after his insurance changes after the first of the year.  I agree with the plan.  He will get in touch with me afterwards so I can send a prescription to his pharmacy to see if this is covered. - I suggested to:  Patient Instructions  Please continue: - Metformin ER 2000 mg at dinnertime (set alarm to remind you) - Actos 45 mg daily in am - Welchol 625 x 2 tablets daily - Jardiance 25 mg before breakfast - Ozempic 1 mg weekly (let me know if we need to change to [facility] in 2024)  Please return in 3-4 months.  - we checked his HbA1c: 7.7% (higher) - advised to check sugars at different times of the day - 1-2x a day, rotating check times - advised for yearly eye exams >> he is UTD - return to clinic in 3-4 months  2. HL -Reviewed latest lipid panel from 05/2022: Fractions at goal: Lab Results  Component Value Date  CHOL 116 06/04/2022   HDL 42.50 06/04/2022   LDLCALC 62 06/04/2022   LDLDIRECT 114.5 02/26/2007   TRIG 58.0 06/04/2022   CHOLHDL 3 06/04/2022  -He continues on Lipitor 20 mg daily without side effects  Philemon Kingdom, MD PhD [facility]

## 2022-07-09 ENCOUNTER — Other Ambulatory Visit (HOSPITAL_BASED_OUTPATIENT_CLINIC_OR_DEPARTMENT_OTHER): Payer: Self-pay

## 2022-07-09 ENCOUNTER — Ambulatory Visit (HOSPITAL_BASED_OUTPATIENT_CLINIC_OR_DEPARTMENT_OTHER): Payer: 59 | Admitting: Certified Registered"

## 2022-07-09 ENCOUNTER — Encounter (HOSPITAL_BASED_OUTPATIENT_CLINIC_OR_DEPARTMENT_OTHER)
Admission: RE | Disposition: A | Discharge status: Home / Self Care | Payer: Self-pay | Source: Ambulatory Visit | Attending: Orthopaedic Surgery

## 2022-07-09 ENCOUNTER — Ambulatory Visit (HOSPITAL_BASED_OUTPATIENT_CLINIC_OR_DEPARTMENT_OTHER)
Admission: RE | Admit: 2022-07-09 | Discharge: 2022-07-09 | Disposition: A | Discharge status: Home / Self Care | Payer: 59 | Source: Ambulatory Visit | Attending: Orthopaedic Surgery | Admitting: Orthopaedic Surgery

## 2022-07-09 ENCOUNTER — Other Ambulatory Visit: Payer: Self-pay

## 2022-07-09 ENCOUNTER — Encounter (HOSPITAL_BASED_OUTPATIENT_CLINIC_OR_DEPARTMENT_OTHER): Payer: Self-pay | Admitting: Orthopaedic Surgery

## 2022-07-09 ENCOUNTER — Encounter: Payer: Self-pay | Admitting: Orthopaedic Surgery

## 2022-07-09 DIAGNOSIS — S82242D Displaced spiral fracture of shaft of left tibia, subsequent encounter for closed fracture with routine healing: Secondary | ICD-10-CM

## 2022-07-09 DIAGNOSIS — Z7984 Long term (current) use of oral hypoglycemic drugs: Secondary | ICD-10-CM | POA: Insufficient documentation

## 2022-07-09 DIAGNOSIS — K219 Gastro-esophageal reflux disease without esophagitis: Secondary | ICD-10-CM | POA: Insufficient documentation

## 2022-07-09 DIAGNOSIS — Y939 Activity, unspecified: Secondary | ICD-10-CM | POA: Insufficient documentation

## 2022-07-09 DIAGNOSIS — I10 Essential (primary) hypertension: Secondary | ICD-10-CM

## 2022-07-09 DIAGNOSIS — Z23 Encounter for immunization: Secondary | ICD-10-CM | POA: Diagnosis not present

## 2022-07-09 DIAGNOSIS — Z01818 Encounter for other preprocedural examination: Secondary | ICD-10-CM

## 2022-07-09 DIAGNOSIS — K449 Diaphragmatic hernia without obstruction or gangrene: Secondary | ICD-10-CM | POA: Diagnosis not present

## 2022-07-09 DIAGNOSIS — D649 Anemia, unspecified: Secondary | ICD-10-CM | POA: Diagnosis not present

## 2022-07-09 DIAGNOSIS — E119 Type 2 diabetes mellitus without complications: Secondary | ICD-10-CM | POA: Diagnosis not present

## 2022-07-09 DIAGNOSIS — S83242A Other tear of medial meniscus, current injury, left knee, initial encounter: Secondary | ICD-10-CM | POA: Diagnosis present

## 2022-07-09 DIAGNOSIS — Z87891 Personal history of nicotine dependence: Secondary | ICD-10-CM | POA: Diagnosis not present

## 2022-07-09 DIAGNOSIS — X58XXXA Exposure to other specified factors, initial encounter: Secondary | ICD-10-CM | POA: Diagnosis not present

## 2022-07-09 DIAGNOSIS — Z6841 Body Mass Index (BMI) 40.0 and over, adult: Secondary | ICD-10-CM | POA: Diagnosis not present

## 2022-07-09 DIAGNOSIS — M94262 Chondromalacia, left knee: Secondary | ICD-10-CM | POA: Insufficient documentation

## 2022-07-09 HISTORY — PX: KNEE ARTHROSCOPY WITH MEDIAL MENISECTOMY: SHX5651

## 2022-07-09 LAB — BASIC METABOLIC PANEL
Anion gap: 10 (ref 5–15)
BUN: 13 mg/dL (ref 6–20)
CO2: 21 mmol/L — ABNORMAL LOW (ref 22–32)
Calcium: 9.3 mg/dL (ref 8.9–10.3)
Chloride: 107 mmol/L (ref 98–111)
Creatinine, Ser: 0.81 mg/dL (ref 0.61–1.24)
GFR, Estimated: >60 mL/min (ref >60)
Glucose, Bld: 159 mg/dL — ABNORMAL HIGH (ref 70–99)
Potassium: 4 mmol/L (ref 3.5–5.1)
Sodium: 138 mmol/L (ref 135–145)

## 2022-07-09 LAB — GLUCOSE, CAPILLARY
Glucose-Capillary: 159 mg/dL — ABNORMAL HIGH (ref 70–99)
Glucose-Capillary: 140 mg/dL — ABNORMAL HIGH (ref 70–99)

## 2022-07-09 SURGERY — ARTHROSCOPY, KNEE, WITH MEDIAL MENISCECTOMY
Anesthesia: General | Site: Knee | Laterality: Left

## 2022-07-09 MED ORDER — MIDAZOLAM HCL 2 MG/2ML IJ SOLN
INTRAMUSCULAR | Status: AC
  Filled 2022-07-09: qty 2

## 2022-07-09 MED ORDER — PROPOFOL 500 MG/50ML IV EMUL
INTRAVENOUS | Status: DC | PRN
  Administered 2022-07-09: 25 ug/kg/min via INTRAVENOUS

## 2022-07-09 MED ORDER — SODIUM CHLORIDE 0.9 % IR SOLN
Status: DC | PRN
  Administered 2022-07-09: 2000 mL

## 2022-07-09 MED ORDER — BUPIVACAINE HCL (PF) 0.25 % IJ SOLN
INTRAMUSCULAR | Status: DC | PRN
  Administered 2022-07-09: 20 mL

## 2022-07-09 MED ORDER — LIDOCAINE HCL (CARDIAC) PF 100 MG/5ML IV SOSY
PREFILLED_SYRINGE | INTRAVENOUS | Status: DC | PRN
  Administered 2022-07-09: 60 mg via INTRAVENOUS

## 2022-07-09 MED ORDER — HYDROCODONE-ACETAMINOPHEN 5-325 MG PO TABS
1.0000 | ORAL_TABLET | Freq: Four times a day (QID) | ORAL | 0 refills | Status: DC | PRN
  Filled 2022-07-09: qty 20, 5d supply, fill #0

## 2022-07-09 MED ORDER — CEFAZOLIN IN SODIUM CHLORIDE 3-0.9 GM/100ML-% IV SOLN
3.0000 g | INTRAVENOUS | Status: AC
  Administered 2022-07-09: 3 g via INTRAVENOUS

## 2022-07-09 MED ORDER — PROPOFOL 10 MG/ML IV BOLUS
INTRAVENOUS | Status: DC | PRN
  Administered 2022-07-09: 200 mg via INTRAVENOUS

## 2022-07-09 MED ORDER — OXYCODONE HCL 5 MG PO TABS: 5.0000 mg | ORAL_TABLET | Freq: Once | ORAL | Status: DC | PRN

## 2022-07-09 MED ORDER — KETOROLAC TROMETHAMINE 30 MG/ML IJ SOLN
30.0000 mg | Freq: Once | INTRAMUSCULAR | Status: AC
  Administered 2022-07-09: 30 mg via INTRAVENOUS

## 2022-07-09 MED ORDER — PROMETHAZINE HCL 25 MG/ML IJ SOLN: 6.2500 mg | INTRAMUSCULAR | Status: DC | PRN

## 2022-07-09 MED ORDER — ACETAMINOPHEN 10 MG/ML IV SOLN
1000.0000 mg | Freq: Four times a day (QID) | INTRAVENOUS | Status: DC
  Administered 2022-07-09: 1000 mg via INTRAVENOUS

## 2022-07-09 MED ORDER — PHENYLEPHRINE 80 MCG/ML (10ML) SYRINGE FOR IV PUSH (FOR BLOOD PRESSURE SUPPORT)
PREFILLED_SYRINGE | INTRAVENOUS | Status: AC
  Filled 2022-07-09: qty 10

## 2022-07-09 MED ORDER — FENTANYL CITRATE (PF) 100 MCG/2ML IJ SOLN
INTRAMUSCULAR | Status: AC
  Filled 2022-07-09: qty 2

## 2022-07-09 MED ORDER — ONDANSETRON HCL 4 MG/2ML IJ SOLN
INTRAMUSCULAR | Status: AC
  Filled 2022-07-09: qty 12

## 2022-07-09 MED ORDER — DEXAMETHASONE SODIUM PHOSPHATE 10 MG/ML IJ SOLN
INTRAMUSCULAR | Status: DC | PRN
  Administered 2022-07-09: 4 mg via INTRAVENOUS

## 2022-07-09 MED ORDER — LACTATED RINGERS IV SOLN: INTRAVENOUS | Status: DC

## 2022-07-09 MED ORDER — ONDANSETRON HCL 4 MG/2ML IJ SOLN
INTRAMUSCULAR | Status: DC | PRN
  Administered 2022-07-09: 4 mg via INTRAVENOUS

## 2022-07-09 MED ORDER — OXYCODONE HCL 5 MG/5ML PO SOLN: 5.0000 mg | Freq: Once | ORAL | Status: DC | PRN

## 2022-07-09 MED ORDER — SUCCINYLCHOLINE CHLORIDE 200 MG/10ML IV SOSY
PREFILLED_SYRINGE | INTRAVENOUS | Status: AC
  Filled 2022-07-09: qty 10

## 2022-07-09 MED ORDER — LIDOCAINE 2% (20 MG/ML) 5 ML SYRINGE
INTRAMUSCULAR | Status: AC
  Filled 2022-07-09: qty 15

## 2022-07-09 MED ORDER — ACETAMINOPHEN 500 MG PO TABS: 1000.0000 mg | ORAL_TABLET | Freq: Once | ORAL | Status: AC

## 2022-07-09 MED ORDER — IBUPROFEN 800 MG PO TABS
800.0000 mg | ORAL_TABLET | Freq: Three times a day (TID) | ORAL | 2 refills | Status: DC | PRN
  Filled 2022-07-09: qty 30, 10d supply, fill #0

## 2022-07-09 MED ORDER — FENTANYL CITRATE (PF) 100 MCG/2ML IJ SOLN
INTRAMUSCULAR | Status: DC | PRN
  Administered 2022-07-09 (×3): 50 ug via INTRAVENOUS

## 2022-07-09 MED ORDER — CEFAZOLIN IN SODIUM CHLORIDE 3-0.9 GM/100ML-% IV SOLN
INTRAVENOUS | Status: AC
  Filled 2022-07-09: qty 100

## 2022-07-09 MED ORDER — DEXAMETHASONE SODIUM PHOSPHATE 10 MG/ML IJ SOLN
INTRAMUSCULAR | Status: AC
  Filled 2022-07-09: qty 2

## 2022-07-09 MED ORDER — CELECOXIB 200 MG PO CAPS: 200.0000 mg | ORAL_CAPSULE | Freq: Once | ORAL | Status: DC

## 2022-07-09 MED ORDER — MEPERIDINE HCL 25 MG/ML IJ SOLN: 6.2500 mg | INTRAMUSCULAR | Status: DC | PRN

## 2022-07-09 MED ORDER — ONDANSETRON HCL 4 MG PO TABS
4.0000 mg | ORAL_TABLET | Freq: Three times a day (TID) | ORAL | 0 refills | Status: DC | PRN
  Filled 2022-07-09: qty 18, 21d supply, fill #0

## 2022-07-09 MED ORDER — PROPOFOL 500 MG/50ML IV EMUL
INTRAVENOUS | Status: AC
  Filled 2022-07-09: qty 50

## 2022-07-09 MED ORDER — MIDAZOLAM HCL 5 MG/5ML IJ SOLN
INTRAMUSCULAR | Status: DC | PRN
  Administered 2022-07-09: 2 mg via INTRAVENOUS

## 2022-07-09 MED ORDER — BUPIVACAINE HCL (PF) 0.25 % IJ SOLN
INTRAMUSCULAR | Status: AC
  Filled 2022-07-09: qty 30

## 2022-07-09 MED ORDER — HYDROMORPHONE HCL 1 MG/ML IJ SOLN: 0.2500 mg | INTRAMUSCULAR | Status: DC | PRN

## 2022-07-09 SURGICAL SUPPLY — 38 items
BANDAGE ESMARK 6X9 LF (GAUZE/BANDAGES/DRESSINGS) IMPLANT
BLADE EXCALIBUR 4.0X13 (MISCELLANEOUS) IMPLANT
BLADE SHAVER TORPEDO 4X13 (MISCELLANEOUS) ×1 IMPLANT
BNDG CMPR 9X6 STRL LF SNTH (GAUZE/BANDAGES/DRESSINGS)
BNDG ELASTIC 6X5.8 VLCR STR LF (GAUZE/BANDAGES/DRESSINGS) ×2 IMPLANT
BNDG ESMARK 6X9 LF (GAUZE/BANDAGES/DRESSINGS)
BURR OVAL 8 FLU 4.0X13 (MISCELLANEOUS) IMPLANT
COOLER ICEMAN CLASSIC (MISCELLANEOUS) ×1 IMPLANT
CUFF TOURN SGL QUICK 34 (TOURNIQUET CUFF) ×1
CUFF TRNQT CYL 34X4.125X (TOURNIQUET CUFF) ×1 IMPLANT
CUTTER BONE 4.0MM X 13CM (MISCELLANEOUS) IMPLANT
DISSECTOR 3.5MM X 13CM CVD (MISCELLANEOUS) IMPLANT
DRAPE ARTHROSCOPY W/POUCH 90 (DRAPES) ×1 IMPLANT
DRAPE IMP U-DRAPE 54X76 (DRAPES) ×1 IMPLANT
DRAPE U-SHAPE 47X51 STRL (DRAPES) ×1 IMPLANT
DURAPREP 26ML APPLICATOR (WOUND CARE) ×2 IMPLANT
ELECT MENISCUS 165MM 90D (ELECTRODE) IMPLANT
EXCALIBUR 3.8MM X 13CM (MISCELLANEOUS) IMPLANT
GAUZE SPONGE 4X4 12PLY STRL (GAUZE/BANDAGES/DRESSINGS) ×1 IMPLANT
GAUZE XEROFORM 1X8 LF (GAUZE/BANDAGES/DRESSINGS) ×1 IMPLANT
GLOVE ECLIPSE 7.0 STRL STRAW (GLOVE) ×2 IMPLANT
GLOVE INDICATOR 7.0 STRL GRN (GLOVE) ×2 IMPLANT
GLOVE INDICATOR 7.5 STRL GRN (GLOVE) ×1 IMPLANT
GLOVE SURG SYN 7.5  E (GLOVE) ×1
GLOVE SURG SYN 7.5 E (GLOVE) ×1 IMPLANT
GLOVE SURG SYN 7.5 PF PI (GLOVE) ×1 IMPLANT
GOWN STRL REUS W/ TWL LRG LVL3 (GOWN DISPOSABLE) ×1 IMPLANT
GOWN STRL REUS W/TWL LRG LVL3 (GOWN DISPOSABLE) ×1
GOWN STRL SURGICAL XL XLNG (GOWN DISPOSABLE) ×2 IMPLANT
MANIFOLD NEPTUNE II (INSTRUMENTS) ×1 IMPLANT
PACK ARTHROSCOPY DSU (CUSTOM PROCEDURE TRAY) ×1 IMPLANT
PACK BASIN DAY SURGERY FS (CUSTOM PROCEDURE TRAY) ×1 IMPLANT
PAD COLD SHLDR WRAP-ON (PAD) ×1 IMPLANT
PENCIL SMOKE EVACUATOR (MISCELLANEOUS) IMPLANT
SHEET MEDIUM DRAPE 40X70 STRL (DRAPES) ×1 IMPLANT
SUT ETHILON 3 0 PS 1 (SUTURE) ×1 IMPLANT
TOWEL GREEN STERILE FF (TOWEL DISPOSABLE) ×1 IMPLANT
TUBING ARTHROSCOPY IRRIG 16FT (MISCELLANEOUS) ×1 IMPLANT

## 2022-07-09 NOTE — Op Note (Signed)
   Surgery Date: 07/09/2022  PREOPERATIVE DIAGNOSES:  1. Left knee medial meniscus tear  POSTOPERATIVE DIAGNOSES:  same  PROCEDURES PERFORMED:  1. Left knee arthroscopy with limited synovectomy 2. Left knee arthroscopy with arthroscopic partial medial meniscectomy 3. Left knee arthroscopy with arthroscopic chondroplasty medial femoral condyle  SURGEON: N. Eduard Roux, M.D.  ASSIST: Ciro Backer [place], [place]; necessary for the timely completion of procedure and due to complexity of procedure.  ANESTHESIA:  general  FLUIDS: Per anesthesia record.   ESTIMATED BLOOD LOSS: minimal  * No implants in log *  DESCRIPTION OF PROCEDURE: Mr. Alfred Johnson is a 50 y.o.-year-old male with above mentioned conditions. Full discussion held regarding risks benefits alternatives and complications related surgical intervention. Conservative care options reviewed. All questions answered.  The patient was identified in the preoperative holding area and the operative extremity was marked. The patient was brought to the operating room and transferred to operating table in a supine position. Satisfactory general anesthesia was induced by anesthesiology.    Standard anterolateral, anteromedial arthroscopy portals were obtained. The anteromedial portal was obtained with a spinal needle for localization under direct visualization with subsequent diagnostic findings.   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  No unstable chondral areas.  We did find a medium sized radial tear of the mid body of the medial meniscus.  Partial medial meniscectomy was performed with oscillating shaver and meniscus basket back to stable margins.  Meniscal root was unremarkable.  The cruciates were unremarkable.  The lateral compartment showed no significant chondromalacia.  Lateral meniscus was unremarkable.  The patellar chondral surfaces showed grade 1-2 changes.  Femoral trochlea was unremarkable.  Gutters  were checked for loose bodies.  Excess fluid was removed from the knee joint.  Incisions were closed with interrupted nylon sutures.  Sterile dressings were applied.  Patient tolerated procedure well had no immediate complications.  Suprapatellar pouch and gutters: no synovitis or debris. Patella chondral surface: Grade 1 Trochlear chondral surface: Grade 0 Patellofemoral tracking: normal Medial meniscus: radial tear midbody.  Medial femoral condyle weight bearing surface: Grade 2 Medial tibial plateau: Grade 1 Anterior cruciate ligament:stable Posterior cruciate ligament:stable Lateral meniscus: normal.   Lateral femoral condyle weight bearing surface: Grade 0 Lateral tibial plateau: Grade 0  DISPOSITION: The patient was awakened from general anesthetic, extubated, taken to the recovery room in medically stable condition, no apparent complications. The patient may be weightbearing as tolerated to the operative lower extremity.  Range of motion of right knee as tolerated.  Azucena Cecil, MD [facility] 3:46 PM

## 2022-07-09 NOTE — Discharge Instructions (Addendum)

## 2022-07-09 NOTE — Anesthesia Preprocedure Evaluation (Addendum)
Anesthesia Evaluation  Patient identified by MRN, date of birth, ID band Patient awake    Reviewed: Allergy & Precautions, NPO status , Patient's Chart, lab work & pertinent test results  History of Anesthesia Complications Negative for: history of anesthetic complications  Airway Mallampati: III  TM Distance: <3 FB Neck ROM: full    Dental  (+) Chipped, Dental Advisory Given   Pulmonary neg shortness of breath, sleep apnea , former smoker   Pulmonary exam normal        Cardiovascular Exercise Tolerance: Good hypertension, (-) angina (-) Past MI  Rhythm:Regular Rate:Normal     Neuro/Psych negative neurological ROS  negative psych ROS   GI/Hepatic Neg liver ROS, hiatal hernia,GERD  Controlled,,  Endo/Other  diabetes, Type 2  Morbid obesity  Renal/GU negative Renal ROS     Musculoskeletal   Abdominal   Peds  Hematology  (+) Blood dyscrasia, anemia   Anesthesia Other Findings    Reproductive/Obstetrics                             Anesthesia Physical Anesthesia Plan  ASA: 3  Anesthesia Plan: General   Post-op Pain Management: Tylenol PO (pre-op)* and Celebrex PO (pre-op)*   Induction: Intravenous  PONV Risk Score and Plan: 2 and Treatment may vary due to age or medical condition, Midazolam, Ondansetron and Dexamethasone  Airway Management Planned: LMA  Additional Equipment:   Intra-op Plan:   Post-operative Plan: Extubation in OR  Informed Consent: I have reviewed the patients History and Physical, chart, labs and discussed the procedure including the risks, benefits and alternatives for the proposed anesthesia with the patient or authorized representative who has indicated his/her understanding and acceptance.     Dental advisory given  Plan Discussed with: CRNA  Anesthesia Plan Comments:         Anesthesia Quick Evaluation

## 2022-07-09 NOTE — Anesthesia Procedure Notes (Signed)
Procedure Name: LMA Insertion Date/Time: 07/09/2022 3:03 PM  Performed by: Clarisse Rodriges, Ernesta Amble, CRNAPre-anesthesia Checklist: Patient identified, Emergency Drugs available, Suction available and Patient being monitored Patient Re-evaluated:Patient Re-evaluated prior to induction Oxygen Delivery Method: Circle system utilized Preoxygenation: Pre-oxygenation with 100% oxygen Induction Type: IV induction Ventilation: Mask ventilation without difficulty LMA: LMA inserted LMA Size: 4.0 Number of attempts: 1 Airway Equipment and Method: Bite block Placement Confirmation: positive ETCO2 Tube secured with: Tape Dental Injury: Teeth and Oropharynx as per pre-operative assessment

## 2022-07-09 NOTE — H&P (Signed)
PREOPERATIVE H&P  Chief Complaint: left knee medial meniscal tear  HPI: Alfred Johnson is a 51 y.o. male who presents for surgical treatment of left knee medial meniscal tear.  He denies any changes in medical history.  Past Medical History:  Diagnosis Date   Acute medial meniscus tear of right knee 01/05/2018   ANEMIA DUE TO CHRONIC BLOOD LOSS 09/10/2009   Diabetes mellitus    Elevated BP    GERD (gastroesophageal reflux disease)    HYPERLIPIDEMIA, MIXED 03/23/2007   Hypertension    KIDNEY STONE 02/05/2009   Left lower lobe pneumonia 02/08/2014   VENTRICULAR FUNCTION, DECREASED 06/21/2007   Past Surgical History:  Procedure Laterality Date   COLONOSCOPY WITH PROPOFOL N/A 10/02/2021   Procedure: COLONOSCOPY WITH PROPOFOL;  Surgeon: Lin Landsman, MD;  Location: [place];  Service: Gastroenterology;  Laterality: N/A;   ESOPHAGOGASTRODUODENOSCOPY N/A 10/02/2021   Procedure: ESOPHAGOGASTRODUODENOSCOPY (EGD);  Surgeon: Lin Landsman, MD;  Location: [facility] ENDOSCOPY;  Service: Gastroenterology;  Laterality: N/A;   Social History   Socioeconomic History   Marital status: Married    Spouse name: Not on file   Number of children: Not on file   Years of education: Not on file   Highest education level: Not on file  Occupational History    Employer: SELF-EMPLOYED    Comment: Does not work outside the home  Tobacco Use   Smoking status: Former   Smokeless tobacco: Former    Quit date: 07/28/1992  Vaping Use   Vaping Use: Never used  Substance and Sexual Activity   Alcohol use: No   Drug use: No   Sexual activity: Not on file  Other Topics Concern   Not on file  Social History Narrative   HH of 3   No pets   Firearms locked away   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    Social Determinants of Health   Financial Resource Strain: Not on file  Food Insecurity: Not on file   Transportation Needs: Not on file  Physical Activity: Not on file  Stress: Not on file  Social Connections: Not on file   Family History  Problem Relation Age of Onset   Graves' disease Mother    Heart attack Father 79       COD   Heart failure Father    Kidney disease Father    Diabetes Brother    HIV Brother    Heart attack Maternal Grandfather    Heart attack Paternal Grandfather    Heart disease Brother 74       CABG   No Known Allergies Prior to Admission medications   Medication Sig Start Date End Date Taking? Authorizing Provider  aspirin 81 MG tablet Take 81 mg by mouth daily.   Yes [provider]  atorvastatin (LIPITOR) 40 MG tablet TAKE 1/2 A TABLET BY MOUTH DAILY. 01/10/22  Yes Hilty, Nadean Corwin, MD  cetirizine (ZYRTEC) 10 MG tablet Take 10 mg by mouth daily.   Yes [provider]  colesevelam (WELCHOL) 625 MG tablet Take 2 tablets (1,250 mg total) by mouth daily. 10/07/21  Yes Renato Shin, MD  empagliflozin (JARDIANCE) 25 MG TABS tablet Take 1 tablet (25 mg total) by mouth daily. 06/09/22  Yes Philemon Kingdom, MD  metFORMIN (GLUCOPHAGE-XR) 500 MG 24 hr tablet Take 4 tablets (2,000 mg total)  by mouth daily with breakfast. 06/09/22  Yes Philemon Kingdom, MD  omeprazole (PRILOSEC) 40 MG capsule Take 1 capsule (40 mg total) by mouth daily before breakfast. 06/09/22 10/07/22 Yes Vanga, Tally Due, MD  pioglitazone (ACTOS) 45 MG tablet Take 1 tablet by mouth once daily 08/05/21  Yes Renato Shin, MD  Accu-Chek Softclix Lancets lancets Use to check blood sugar 3 times daily 07/08/22     Continuous Blood Gluc Sensor (FREESTYLE LIBRE 3 SENSOR) MISC Use 1 sensor every 14 days to check blood sugar. 07/08/22   Philemon Kingdom, MD  COVID-19 mRNA bivalent vaccine, Pfizer, (PFIZER COVID-19 VAC BIVALENT) injection Inject into the muscle. 05/30/21   Carlyle Basques, MD  COVID-19 mRNA vaccine [phone] (COMIRNATY) syringe Inject into the muscle. 05/16/22   Carlyle Basques, MD  glucose blood test strip Use to test blood sugar 3 times daily. 07/08/22   Philemon Kingdom, MD  Semaglutide, 1 MG/DOSE, (OZEMPIC, 1 MG/DOSE,) 4 MG/3ML SOPN Inject 1 mg into the skin once a week. 07/08/22   Philemon Kingdom, MD     Positive ROS: All other systems have been reviewed and were otherwise negative with the exception of those mentioned in the HPI and as above.  Physical Exam: General: Alert, no acute distress Cardiovascular: No pedal edema Respiratory: No cyanosis, no use of accessory musculature GI: abdomen soft Skin: No lesions in the area of chief complaint Neurologic: Sensation intact distally Psychiatric: Patient is competent for consent with normal mood and affect Lymphatic: no lymphedema  MUSCULOSKELETAL: exam stable  Assessment: left knee medial meniscal tear  Plan: Plan for Procedure(s): LEFT KNEE ARTHROSCOPY, PARTIAL MEDIAL MENISCECTOMY  The risks benefits and alternatives were discussed with the patient including but not limited to the risks of nonoperative treatment, versus surgical intervention including infection, bleeding, nerve injury,  blood clots, cardiopulmonary complications, morbidity, mortality, among others, and they were willing to proceed.   Eduard Roux, MD 07/09/2022 2:35 PM

## 2022-07-09 NOTE — Anesthesia Postprocedure Evaluation (Signed)
Anesthesia Post Note  Patient: Alfred Johnson  Procedure(s) Performed: LEFT KNEE ARTHROSCOPY, DEBRIDEMENT AND PARTIAL MEDIAL MENISCECTOMY (Left: Knee)     Patient location during evaluation: PACU Anesthesia Type: General Level of consciousness: awake Pain management: pain level controlled Vital Signs Assessment: post-procedure vital signs reviewed and stable Respiratory status: spontaneous breathing, nonlabored ventilation and respiratory function stable Cardiovascular status: blood pressure returned to baseline and stable Postop Assessment: no apparent nausea or vomiting Anesthetic complications: no   No notable events documented.  Last Vitals:  Vitals:   07/09/22 1615 07/09/22 1630  BP: (!) 154/82 119/80  Pulse: 92 83  Resp: 17 11  Temp:    SpO2: 95% 92%    Last Pain:  Vitals:   07/09/22 1630  TempSrc:   PainSc: 5                  Nilda Simmer

## 2022-07-09 NOTE — Transfer of Care (Signed)
Immediate Anesthesia Transfer of Care Note  Patient: GAYLEN VENNING  Procedure(s) Performed: LEFT KNEE ARTHROSCOPY, PARTIAL MEDIAL MENISCECTOMY (Left: Knee)  Patient Location: PACU  Anesthesia Type:General  Level of Consciousness: awake, alert , oriented, and patient cooperative  Airway & Oxygen Therapy: Patient Spontanous Breathing and Patient connected to face mask oxygen  Post-op Assessment: Report given to RN and Post -op Vital signs reviewed and stable  Post vital signs: Reviewed and stable  Last Vitals:  Vitals Value Taken Time  BP 140/98 07/09/22 1554  Temp    Pulse 96 07/09/22 1554  Resp 15 07/09/22 1554  SpO2 88 % 07/09/22 1554  Vitals shown include unvalidated device data.  Last Pain:  Vitals:   07/09/22 1300  TempSrc: Oral  PainSc: 0-No pain      Patients Stated Pain Goal: 3 (67/28/97 9150)  Complications: No notable events documented.

## 2022-07-10 ENCOUNTER — Encounter (HOSPITAL_BASED_OUTPATIENT_CLINIC_OR_DEPARTMENT_OTHER): Payer: Self-pay | Admitting: Orthopaedic Surgery

## 2022-07-15 ENCOUNTER — Telehealth: Payer: Self-pay

## 2022-07-15 ENCOUNTER — Other Ambulatory Visit (HOSPITAL_BASED_OUTPATIENT_CLINIC_OR_DEPARTMENT_OTHER): Payer: Self-pay

## 2022-07-15 DIAGNOSIS — E1165 Type 2 diabetes mellitus with hyperglycemia: Secondary | ICD-10-CM

## 2022-07-15 MED ORDER — DEXCOM G7 RECEIVER DEVI
0 refills | Status: DC
  Filled 2022-07-15: qty 1, fill #0

## 2022-07-15 MED ORDER — DEXCOM G6 SENSOR MISC
3 refills | Status: DC
  Filled 2022-07-15 – 2022-07-17 (×2): qty 9, 90d supply, fill #0
  Filled 2022-07-17: qty 3, 30d supply, fill #0

## 2022-07-15 NOTE — Telephone Encounter (Signed)
Libre not covered by [employer] is preferred CGM. Rx sent to pharmacy.

## 2022-07-16 ENCOUNTER — Ambulatory Visit (INDEPENDENT_AMBULATORY_CARE_PROVIDER_SITE_OTHER): Payer: 59 | Admitting: Physician Assistant

## 2022-07-16 DIAGNOSIS — Z9889 Other specified postprocedural states: Secondary | ICD-10-CM

## 2022-07-16 NOTE — Progress Notes (Signed)
Post-Op Visit Note   Patient: Alfred Johnson           Date of Birth: 07-Jul-1971           MRN: [id number] Visit Date: 07/16/2022 PCP: Burnis Medin, MD   Assessment & Plan:  Chief Complaint:  Chief Complaint  Patient presents with   Left Knee - Pain   Visit Diagnoses:  1. S/P left knee arthroscopy     Plan: Patient is a pleasant 51 year old gentleman who comes in today approximately 1 week status post left knee arthroscopic debridement medial meniscus, synovectomy and chondroplasty, date of surgery 07/09/2022.  He has been doing great.  Slight discomfort alleviated with ibuprofen.  Examination of the left knee reveals well-healed surgical portals with nylon sutures in place.  No evidence of infection or cellulitis.  Calf is soft nontender.  He is neurovascular intact distally.  Today, sutures were removed and Steri-Strips applied.  I provided him with a home exercise program.  Intraoperative pictures reviewed.  Follow-up with [place] in 5 weeks for recheck.  Call with concerns or questions.  Follow-Up Instructions: Return in about 5 weeks (around 08/20/2022).   Orders:  No orders of the defined types were placed in this encounter.  No orders of the defined types were placed in this encounter.   Imaging: No new imaging  PMFS History: Patient Active Problem List   Diagnosis Date Noted   Acute medial meniscus tear of left knee 06/26/2022   Colon cancer screening    Polyp of descending colon    S/P right knee arthroscopy 04/15/2018   Lung nodule 02/11/2016   Diabetes mellitus without complication ([place]) 41/32/4401   BMI 35.0-35.9,adult 06/01/2012   Visit for preventive health examination 06/01/2012   Cough 07/09/2010   WEIGHT GAIN 09/10/2009   KIDNEY STONE 02/05/2009   Diabetes ([place]) 03/23/2007   HYPERLIPIDEMIA, MIXED 03/23/2007   OBESITY NOS 02/25/2007   Essential hypertension 02/25/2007   Hiatal hernia with GERD without esophagitis 02/11/2007   Past Medical History:   Diagnosis Date   Acute medial meniscus tear of right knee 01/05/2018   ANEMIA DUE TO CHRONIC BLOOD LOSS 09/10/2009   Diabetes mellitus    Elevated BP    GERD (gastroesophageal reflux disease)    HYPERLIPIDEMIA, MIXED 03/23/2007   Hypertension    KIDNEY STONE 02/05/2009   Left lower lobe pneumonia 02/08/2014   VENTRICULAR FUNCTION, DECREASED 06/21/2007    Family History  Problem Relation Age of Onset   Graves' disease Mother    Heart attack Father 10       COD   Heart failure Father    Kidney disease Father    Diabetes Brother    HIV Brother    Heart attack Maternal Grandfather    Heart attack Paternal Grandfather    Heart disease Brother 81       CABG    Past Surgical History:  Procedure Laterality Date   COLONOSCOPY WITH PROPOFOL N/A 10/02/2021   Procedure: COLONOSCOPY WITH PROPOFOL;  Surgeon: Lin Landsman, MD;  Location: [facility] ENDOSCOPY;  Service: Gastroenterology;  Laterality: N/A;   ESOPHAGOGASTRODUODENOSCOPY N/A 10/02/2021   Procedure: ESOPHAGOGASTRODUODENOSCOPY (EGD);  Surgeon: Lin Landsman, MD;  Location: [facility] ENDOSCOPY;  Service: Gastroenterology;  Laterality: N/A;   KNEE ARTHROSCOPY WITH MEDIAL MENISECTOMY Left 07/09/2022   Procedure: LEFT KNEE ARTHROSCOPY, DEBRIDEMENT AND PARTIAL MEDIAL MENISCECTOMY;  Surgeon: Leandrew Koyanagi, MD;  Location: [place];  Service: Orthopedics;  Laterality: Left;   Social  History   Occupational History    Employer: SELF-EMPLOYED    Comment: Does not work outside the home  Tobacco Use   Smoking status: Former   Smokeless tobacco: Former    Quit date: 07/28/1992  Vaping Use   Vaping Use: Never used  Substance and Sexual Activity   Alcohol use: No   Drug use: No   Sexual activity: Not on file

## 2022-07-17 ENCOUNTER — Other Ambulatory Visit (HOSPITAL_COMMUNITY): Payer: Self-pay

## 2022-07-17 ENCOUNTER — Encounter: Payer: 59 | Admitting: Orthopaedic Surgery

## 2022-07-17 ENCOUNTER — Other Ambulatory Visit (HOSPITAL_BASED_OUTPATIENT_CLINIC_OR_DEPARTMENT_OTHER): Payer: Self-pay

## 2022-07-17 NOTE — Telephone Encounter (Signed)
Pharmacy Patient Advocate Encounter   Received notification from [place] that prior authorization for [place] is needed.   Per test claim patients insurance covers [place].   Please advise if alternative script is appropriate.  Karie Soda, CPhT Pharmacy Patient Advocate Specialist Direct Number: [phone] Fax: [phone]

## 2022-07-18 ENCOUNTER — Other Ambulatory Visit (HOSPITAL_BASED_OUTPATIENT_CLINIC_OR_DEPARTMENT_OTHER): Payer: Self-pay

## 2022-07-18 MED ORDER — DEXCOM G6 SENSOR MISC: 3 refills | Status: DC

## 2022-07-18 MED ORDER — DEXCOM G6 RECEIVER DEVI
0 refills | Status: DC
  Filled 2022-07-18: qty 1, 1d supply, fill #0
  Filled 2022-07-18: qty 1, fill #0

## 2022-07-18 MED ORDER — DEXCOM G6 TRANSMITTER MISC
3 refills | Status: DC
  Filled 2022-07-18: qty 1, 90d supply, fill #0

## 2022-07-18 NOTE — Telephone Encounter (Signed)
G6 rx sent to pharmacy.

## 2022-07-18 NOTE — Addendum Note (Signed)
Addended by: Lauralyn Primes on: 07/18/2022 07:29 AM   Modules accepted: Orders

## 2022-07-21 ENCOUNTER — Other Ambulatory Visit (HOSPITAL_BASED_OUTPATIENT_CLINIC_OR_DEPARTMENT_OTHER): Payer: Self-pay

## 2022-07-22 ENCOUNTER — Other Ambulatory Visit (HOSPITAL_BASED_OUTPATIENT_CLINIC_OR_DEPARTMENT_OTHER): Payer: Self-pay

## 2022-08-01 ENCOUNTER — Other Ambulatory Visit (HOSPITAL_BASED_OUTPATIENT_CLINIC_OR_DEPARTMENT_OTHER): Payer: Self-pay

## 2022-08-04 ENCOUNTER — Other Ambulatory Visit (HOSPITAL_BASED_OUTPATIENT_CLINIC_OR_DEPARTMENT_OTHER): Payer: Self-pay

## 2022-08-04 ENCOUNTER — Encounter: Payer: Self-pay | Admitting: Internal Medicine

## 2022-08-04 ENCOUNTER — Other Ambulatory Visit: Payer: Self-pay | Admitting: Internal Medicine

## 2022-08-04 DIAGNOSIS — E1165 Type 2 diabetes mellitus with hyperglycemia: Secondary | ICD-10-CM

## 2022-08-04 MED ORDER — FREESTYLE LIBRE 3 READER DEVI
1.0000 | Freq: Once | 0 refills | Status: AC
  Filled 2022-08-04: qty 1, 30d supply, fill #0

## 2022-08-04 MED ORDER — TIRZEPATIDE 5 MG/0.5ML ~~LOC~~ SOAJ
5.0000 mg | SUBCUTANEOUS | 3 refills | Status: DC
  Filled 2022-08-04 – 2022-09-02 (×2): qty 2, 28d supply, fill #0
  Filled 2022-09-24: qty 2, 28d supply, fill #1
  Filled 2022-10-23 (×2): qty 2, 28d supply, fill #2

## 2022-08-04 MED ORDER — FREESTYLE LIBRE 3 SENSOR MISC
1.0000 "application " | 3 refills | Status: DC
  Filled 2022-08-04: qty 2, 28d supply, fill #0
  Filled 2022-09-01: qty 1, 14d supply, fill #0
  Filled 2022-09-02: qty 2, 28d supply, fill #1
  Filled 2022-10-07: qty 2, 28d supply, fill #2
  Filled 2022-11-05: qty 2, 28d supply, fill #3
  Filled 2022-11-29 – 2022-12-01 (×3): qty 2, 28d supply, fill #4
  Filled 2022-12-27 (×2): qty 2, 28d supply, fill #5
  Filled 2023-02-07: qty 2, 28d supply, fill #6
  Filled 2023-03-06: qty 2, 28d supply, fill #7
  Filled 2023-04-03: qty 2, 28d supply, fill #8
  Filled 2023-05-01: qty 2, 28d supply, fill #9
  Filled 2023-05-28: qty 2, 28d supply, fill #10
  Filled 2023-06-24 (×2): qty 2, 28d supply, fill #11
  Filled 2023-07-30: qty 1, 14d supply, fill #12

## 2022-08-05 ENCOUNTER — Other Ambulatory Visit (HOSPITAL_BASED_OUTPATIENT_CLINIC_OR_DEPARTMENT_OTHER): Payer: Self-pay

## 2022-08-07 ENCOUNTER — Other Ambulatory Visit (HOSPITAL_BASED_OUTPATIENT_CLINIC_OR_DEPARTMENT_OTHER): Payer: Self-pay

## 2022-08-10 ENCOUNTER — Other Ambulatory Visit (HOSPITAL_BASED_OUTPATIENT_CLINIC_OR_DEPARTMENT_OTHER): Payer: Self-pay

## 2022-08-12 ENCOUNTER — Telehealth: Payer: Self-pay

## 2022-08-12 ENCOUNTER — Other Ambulatory Visit (HOSPITAL_BASED_OUTPATIENT_CLINIC_OR_DEPARTMENT_OTHER): Payer: Self-pay

## 2022-08-12 ENCOUNTER — Other Ambulatory Visit (HOSPITAL_COMMUNITY): Payer: Self-pay

## 2022-08-12 NOTE — Telephone Encounter (Signed)
Patient Advocate Encounter   Received notification from [employer] that prior authorization is required for [facility] '5MG'$ /0.5ML pen-injectors   Submitted: 08-12-2022 Key [id number]   Status is pending

## 2022-08-12 NOTE — Telephone Encounter (Signed)
Patient Advocate Encounter   Received notification from [employer] that prior authorization is required for [employer] 3 Sensor   Submitted: 08-12-2022 Key [id number]    Status is pending

## 2022-08-13 ENCOUNTER — Encounter (HOSPITAL_BASED_OUTPATIENT_CLINIC_OR_DEPARTMENT_OTHER): Payer: Self-pay | Admitting: Pharmacist

## 2022-08-13 ENCOUNTER — Other Ambulatory Visit (HOSPITAL_BASED_OUTPATIENT_CLINIC_OR_DEPARTMENT_OTHER): Payer: Self-pay

## 2022-08-18 ENCOUNTER — Other Ambulatory Visit (HOSPITAL_COMMUNITY): Payer: Self-pay

## 2022-08-18 NOTE — Telephone Encounter (Signed)
Patient Advocate Encounter  Received a fax from [employer] regarding Prior Authorization for [employer] 3 Sensor .   Authorization has been DENIED due to

## 2022-08-18 NOTE — Telephone Encounter (Signed)
Patient Advocate Encounter  Received a fax from [employer] regarding Prior Authorization for [employer]oun[employer]o '5MG'$ /0.5ML pen-injectors   Authorization has been DENIED due to  .

## 2022-08-21 ENCOUNTER — Other Ambulatory Visit (HOSPITAL_BASED_OUTPATIENT_CLINIC_OR_DEPARTMENT_OTHER): Payer: Self-pay

## 2022-08-21 ENCOUNTER — Ambulatory Visit (INDEPENDENT_AMBULATORY_CARE_PROVIDER_SITE_OTHER): Payer: 59 | Admitting: Physician Assistant

## 2022-08-21 DIAGNOSIS — Z9889 Other specified postprocedural states: Secondary | ICD-10-CM

## 2022-08-21 NOTE — Progress Notes (Signed)
Post-Op Visit Note   Patient: Alfred Johnson           Date of Birth: 01/28/1971           MRN: [id number] Visit Date: 08/21/2022 PCP: Burnis Medin, MD   Assessment & Plan:  Chief Complaint:  Chief Complaint  Patient presents with   Left Knee - Routine Post Op   Visit Diagnoses:  1. S/P left knee arthroscopy     Plan: Patient is a pleasant 52 year old gentleman who comes in today 6 weeks status post left knee arthroscopic debridement medial meniscus 07/16/2022.  He has been doing well.  He notes occasional pain and stiffness to the medial knee but has been out doing a lot of work.  He is only taking over-the-counter pain medication as needed which is very infrequent.  Overall doing well.  Examination left knee reveals fully healed surgical portals without complication.  Range of motion 0 to 125 degrees.  Calves are soft nontender.  He is neurovascular intact distally.  At this point, he will continue to advance with activity as tolerated.  Follow-up as needed.  Follow-Up Instructions: Return if symptoms worsen or fail to improve.   Orders:  No orders of the defined types were placed in this encounter.  No orders of the defined types were placed in this encounter.   Imaging: No new imaging  PMFS History: Patient Active Problem List   Diagnosis Date Noted   Acute medial meniscus tear of left knee 06/26/2022   Colon cancer screening    Polyp of descending colon    S/P right knee arthroscopy 04/15/2018   Lung nodule 02/11/2016   Diabetes mellitus without complication ([place]) 78/29/5621   BMI 35.0-35.9,adult 06/01/2012   Visit for preventive health examination 06/01/2012   Cough 07/09/2010   WEIGHT GAIN 09/10/2009   KIDNEY STONE 02/05/2009   Diabetes ([place]) 03/23/2007   HYPERLIPIDEMIA, MIXED 03/23/2007   OBESITY NOS 02/25/2007   Essential hypertension 02/25/2007   Hiatal hernia with GERD without esophagitis 02/11/2007   Past Medical History:  Diagnosis Date    Acute medial meniscus tear of right knee 01/05/2018   ANEMIA DUE TO CHRONIC BLOOD LOSS 09/10/2009   Diabetes mellitus    Elevated BP    GERD (gastroesophageal reflux disease)    HYPERLIPIDEMIA, MIXED 03/23/2007   Hypertension    KIDNEY STONE 02/05/2009   Left lower lobe pneumonia 02/08/2014   VENTRICULAR FUNCTION, DECREASED 06/21/2007    Family History  Problem Relation Age of Onset   Graves' disease Mother    Heart attack Father 61       COD   Heart failure Father    Kidney disease Father    Diabetes Brother    HIV Brother    Heart attack Maternal Grandfather    Heart attack Paternal Grandfather    Heart disease Brother 70       CABG    Past Surgical History:  Procedure Laterality Date   COLONOSCOPY WITH PROPOFOL N/A 10/02/2021   Procedure: COLONOSCOPY WITH PROPOFOL;  Surgeon: Lin Landsman, MD;  Location: [facility] ENDOSCOPY;  Service: Gastroenterology;  Laterality: N/A;   ESOPHAGOGASTRODUODENOSCOPY N/A 10/02/2021   Procedure: ESOPHAGOGASTRODUODENOSCOPY (EGD);  Surgeon: Lin Landsman, MD;  [facility] ENDOSCOPY;  Service: Gastroenterology;  Laterality: N/A;   KNEE ARTHROSCOPY WITH MEDIAL MENISECTOMY Left 07/09/2022   Procedure: LEFT KNEE ARTHROSCOPY, DEBRIDEMENT AND PARTIAL MEDIAL MENISCECTOMY;  Surgeon: Leandrew Koyanagi, MD;  Location: [place];  Service: Orthopedics;  Laterality: Left;   Social History   Occupational History    Employer: SELF-EMPLOYED    Comment: Does not work outside the home  Tobacco Use   Smoking status: Former   Smokeless tobacco: Former    Quit date: 07/28/1992  Vaping Use   Vaping Use: Never used  Substance and Sexual Activity   Alcohol use: No   Drug use: No   Sexual activity: Not on file

## 2022-08-22 ENCOUNTER — Other Ambulatory Visit (HOSPITAL_BASED_OUTPATIENT_CLINIC_OR_DEPARTMENT_OTHER): Payer: Self-pay

## 2022-08-26 NOTE — Telephone Encounter (Signed)
Patient Advocate Encounter  I was able to resubmit this request.    Prior Authorization for [facility] '5MG'$ /0.5ML pen-injectors has been approved through [employer]  .   Effective: 08-26-2022 to 08-27-2023

## 2022-08-27 NOTE — Telephone Encounter (Signed)
Inbound call from insurance advising [id number] was approved for Mounjaro effective 08/26/22 - 08/27/23

## 2022-08-29 ENCOUNTER — Other Ambulatory Visit (HOSPITAL_COMMUNITY): Payer: Self-pay

## 2022-09-01 ENCOUNTER — Other Ambulatory Visit (HOSPITAL_BASED_OUTPATIENT_CLINIC_OR_DEPARTMENT_OTHER): Payer: Self-pay

## 2022-09-02 ENCOUNTER — Other Ambulatory Visit (HOSPITAL_BASED_OUTPATIENT_CLINIC_OR_DEPARTMENT_OTHER): Payer: Self-pay

## 2022-09-12 ENCOUNTER — Other Ambulatory Visit (HOSPITAL_BASED_OUTPATIENT_CLINIC_OR_DEPARTMENT_OTHER): Payer: Self-pay

## 2022-09-13 ENCOUNTER — Other Ambulatory Visit (HOSPITAL_BASED_OUTPATIENT_CLINIC_OR_DEPARTMENT_OTHER): Payer: Self-pay

## 2022-09-15 ENCOUNTER — Other Ambulatory Visit (HOSPITAL_BASED_OUTPATIENT_CLINIC_OR_DEPARTMENT_OTHER): Payer: Self-pay

## 2022-09-16 ENCOUNTER — Other Ambulatory Visit: Payer: Self-pay

## 2022-09-16 ENCOUNTER — Other Ambulatory Visit (HOSPITAL_BASED_OUTPATIENT_CLINIC_OR_DEPARTMENT_OTHER): Payer: Self-pay

## 2022-09-16 DIAGNOSIS — E1165 Type 2 diabetes mellitus with hyperglycemia: Secondary | ICD-10-CM

## 2022-09-16 MED ORDER — PIOGLITAZONE HCL 45 MG PO TABS
45.0000 mg | ORAL_TABLET | Freq: Every day | ORAL | 3 refills | Status: DC
  Filled 2022-09-17: qty 30, 30d supply, fill #0
  Filled 2022-10-13: qty 30, 30d supply, fill #1
  Filled 2022-11-12: qty 30, 30d supply, fill #2
  Filled 2022-12-10: qty 30, 30d supply, fill #3
  Filled 2023-01-07: qty 30, 30d supply, fill #4
  Filled 2023-02-05: qty 30, 30d supply, fill #5
  Filled 2023-03-06: qty 30, 30d supply, fill #6
  Filled 2023-04-04: qty 30, 30d supply, fill #7
  Filled 2023-04-30: qty 30, 30d supply, fill #8
  Filled 2023-06-03: qty 30, 30d supply, fill #9
  Filled 2023-07-02: qty 30, 30d supply, fill #10
  Filled 2023-07-30: qty 30, 30d supply, fill #11

## 2022-09-17 ENCOUNTER — Other Ambulatory Visit (HOSPITAL_BASED_OUTPATIENT_CLINIC_OR_DEPARTMENT_OTHER): Payer: Self-pay

## 2022-09-22 ENCOUNTER — Other Ambulatory Visit: Payer: Self-pay | Admitting: Endocrinology

## 2022-09-22 ENCOUNTER — Other Ambulatory Visit (HOSPITAL_BASED_OUTPATIENT_CLINIC_OR_DEPARTMENT_OTHER): Payer: Self-pay

## 2022-09-22 ENCOUNTER — Other Ambulatory Visit: Payer: Self-pay

## 2022-09-22 MED ORDER — COLESEVELAM HCL 625 MG PO TABS
1250.0000 mg | ORAL_TABLET | Freq: Every day | ORAL | 3 refills | Status: DC
  Filled 2022-09-22: qty 60, 30d supply, fill #0
  Filled 2022-10-23 (×2): qty 60, 30d supply, fill #1
  Filled 2022-11-22: qty 60, 30d supply, fill #2
  Filled 2022-12-20 (×2): qty 60, 30d supply, fill #3
  Filled 2023-01-12: qty 60, 30d supply, fill #4
  Filled 2023-02-05: qty 60, 30d supply, fill #5
  Filled 2023-03-23: qty 60, 30d supply, fill #6
  Filled 2023-04-19: qty 60, 30d supply, fill #7
  Filled 2023-05-19: qty 60, 30d supply, fill #8
  Filled 2023-06-19 – 2023-06-20 (×2): qty 60, 30d supply, fill #9
  Filled 2023-07-18 (×2): qty 60, 30d supply, fill #10

## 2022-09-24 ENCOUNTER — Encounter (HOSPITAL_BASED_OUTPATIENT_CLINIC_OR_DEPARTMENT_OTHER): Payer: Self-pay | Admitting: Pharmacist

## 2022-09-24 ENCOUNTER — Other Ambulatory Visit: Payer: Self-pay | Admitting: Gastroenterology

## 2022-09-24 ENCOUNTER — Other Ambulatory Visit (HOSPITAL_BASED_OUTPATIENT_CLINIC_OR_DEPARTMENT_OTHER): Payer: Self-pay

## 2022-09-24 ENCOUNTER — Other Ambulatory Visit: Payer: Self-pay

## 2022-09-24 DIAGNOSIS — R142 Eructation: Secondary | ICD-10-CM

## 2022-09-24 DIAGNOSIS — K219 Gastro-esophageal reflux disease without esophagitis: Secondary | ICD-10-CM

## 2022-09-26 ENCOUNTER — Other Ambulatory Visit (HOSPITAL_BASED_OUTPATIENT_CLINIC_OR_DEPARTMENT_OTHER): Payer: Self-pay

## 2022-10-02 ENCOUNTER — Other Ambulatory Visit (HOSPITAL_BASED_OUTPATIENT_CLINIC_OR_DEPARTMENT_OTHER): Payer: Self-pay

## 2022-10-07 ENCOUNTER — Other Ambulatory Visit (HOSPITAL_BASED_OUTPATIENT_CLINIC_OR_DEPARTMENT_OTHER): Payer: Self-pay

## 2022-10-08 ENCOUNTER — Other Ambulatory Visit (HOSPITAL_BASED_OUTPATIENT_CLINIC_OR_DEPARTMENT_OTHER): Payer: Self-pay

## 2022-10-23 ENCOUNTER — Other Ambulatory Visit (HOSPITAL_BASED_OUTPATIENT_CLINIC_OR_DEPARTMENT_OTHER): Payer: Self-pay

## 2022-10-23 ENCOUNTER — Other Ambulatory Visit: Payer: Self-pay

## 2022-11-01 ENCOUNTER — Other Ambulatory Visit (HOSPITAL_BASED_OUTPATIENT_CLINIC_OR_DEPARTMENT_OTHER): Payer: Self-pay

## 2022-11-05 ENCOUNTER — Other Ambulatory Visit (HOSPITAL_BASED_OUTPATIENT_CLINIC_OR_DEPARTMENT_OTHER): Payer: Self-pay

## 2022-11-07 ENCOUNTER — Ambulatory Visit (INDEPENDENT_AMBULATORY_CARE_PROVIDER_SITE_OTHER): Payer: 59 | Admitting: Internal Medicine

## 2022-11-07 ENCOUNTER — Encounter: Payer: Self-pay | Admitting: Internal Medicine

## 2022-11-07 VITALS — BP 122/72 | HR 85 | Ht 69.0 in | Wt 278.0 lb

## 2022-11-07 DIAGNOSIS — E1165 Type 2 diabetes mellitus with hyperglycemia: Secondary | ICD-10-CM | POA: Diagnosis not present

## 2022-11-07 DIAGNOSIS — E785 Hyperlipidemia, unspecified: Secondary | ICD-10-CM | POA: Diagnosis not present

## 2022-11-07 LAB — POCT GLYCOSYLATED HEMOGLOBIN (HGB A1C): Hemoglobin A1C: 7.2 % — AB (ref 4.0–5.6)

## 2022-11-07 MED ORDER — DEXCOM G7 SENSOR MISC: 3.0000 | 4 refills | Status: DC

## 2022-11-07 NOTE — Patient Instructions (Addendum)
Please continue: - Metformin ER 2000 mg at dinnertime  - Actos 45 mg daily in am - Welchol 625 x 2 tablets daily - Jardiance 25 mg before breakfast  Try to start: - Mounjaro 5 mg weekly   Please return in 4 months.

## 2022-11-07 NOTE — Progress Notes (Signed)
Patient ID: Alfred Johnson, male   DOB: 04/05/1971, 52 y.o.   MRN: [id number]  HPI: Alfred Johnson is a 52 y.o.-year-old male, returning for follow-up for DM2, dx in 2008, non-insulin-dependent, fairly well controlled, without long-term complications. Pt. previously saw Dr. Everardo All, last visit with me 4 months ago. He will change insurance soon: from [place] to [employer].  Interim history He has increased urination, but no blurry vision, no chest pain. He had a torn meniscus at last visit.  He had left knee surgery 07/09/2022.  He still has the pain and it is not clear whether this is due to mobilizing his knee too soon.  Reviewed HbA1c: Lab Results  Component Value Date   HGBA1C 7.7 (A) 07/08/2022   HGBA1C 7.2 (A) 03/17/2022   HGBA1C 7.1 (A) 10/07/2021   HGBA1C 7.2 (A) 06/05/2021   HGBA1C 7.7 (H) 05/27/2021   HGBA1C 7.5 (A) 02/05/2021   HGBA1C 7.0 (A) 10/02/2020   HGBA1C 7.1 (A) 05/30/2020   HGBA1C 7.2 (A) 02/07/2020   HGBA1C 7.4 (A) 11/01/2019   Pt is on a regimen of: - Metformin ER 2000 mg in am >> at dinnertime (forgets doses - up to 50%) - Actos 45 mg daily in am - Welchol 625 x 2 tablets daily in am  - Jardiance 25 mg before breakfast - increased urination - Ozempic 2 mg weekly - hypersensitivity to foods, nausea, occasional vomiting >> alternating 1 with 2 mg weekly >> 1 mg weekly >> Mounjaro 5 mg weekly She previously tried Invokana >> itching. We stopped bromocriptine 02/2022  Pt checks his sugars >4x a day with his [place]. Prev. On Dexcom until approximately 10 days ago:   Prev.: - am: 140-150 >> 110-115 (when taking Metformin at night) - 2h after b'fast: n/c - before lunch: n/c - h after lunch: 170-180 - before dinner: n/c - 2h after dinner: n/c - bedtime: n/c - nighttime: n/c Lowest sugar was 90s >> 90; he has hypoglycemia awareness at 90.  Highest sugar was 190 >> 180 >> 292.  Glucometer: One Touch ultra  - no CKD, last BUN/creatinine:  Lab  Results  Component Value Date   BUN 13 07/09/2022   BUN 17 06/04/2022   CREATININE 0.81 07/09/2022   CREATININE 0.76 06/04/2022  On Jardiance.  -+ HL; last set of lipids: Lab Results  Component Value Date   CHOL 116 06/04/2022   HDL 42.50 06/04/2022   LDLCALC 62 06/04/2022   LDLDIRECT 114.5 02/26/2007   TRIG 58.0 06/04/2022   CHOLHDL 3 06/04/2022  On Lipitor 20 mg daily.  - last eye exam was in 04/2022. No DR reportedly but in 2021: + DR.   - no numbness and tingling in his feet.  Latest foot exam was by Dr. Charlsie Merles 02/03/2022.  He also has a history of kidney stones, GERD, HTN, obesity. Per review of Dr. George Hugh note, he declined weight loss surgery in the past. Cardiology investigation: LP(a) (06/11/2022) was elevated at 81.9, coronary calcium score was also elevated (06/10/2021) at 383.  ROS: + see HPI  Past Medical History:  Diagnosis Date   Acute medial meniscus tear of right knee 01/05/2018   ANEMIA DUE TO CHRONIC BLOOD LOSS 09/10/2009   Diabetes mellitus    Elevated BP    GERD (gastroesophageal reflux disease)    HYPERLIPIDEMIA, MIXED 03/23/2007   Hypertension    KIDNEY STONE 02/05/2009   Left lower lobe pneumonia 02/08/2014   VENTRICULAR FUNCTION, DECREASED 06/21/2007   Past Surgical  History:  Procedure Laterality Date   COLONOSCOPY WITH PROPOFOL N/A 10/02/2021   Procedure: COLONOSCOPY WITH PROPOFOL;  Surgeon: Toney Reil, MD;  Location: [facility] ENDOSCOPY;  Service: Gastroenterology;  Laterality: N/A;   ESOPHAGOGASTRODUODENOSCOPY N/A 10/02/2021   Procedure: ESOPHAGOGASTRODUODENOSCOPY (EGD);  Surgeon: Toney Reil, MD;  Location: [facility] ENDOSCOPY;  Service: Gastroenterology;  Laterality: N/A;   KNEE ARTHROSCOPY WITH MEDIAL MENISECTOMY Left 07/09/2022   Procedure: LEFT KNEE ARTHROSCOPY, DEBRIDEMENT AND PARTIAL MEDIAL MENISCECTOMY;  Surgeon: Tarry Kos, MD;  Location: [facility];  Service: Orthopedics;  Laterality: Left;   Social History    Socioeconomic History   Marital status: Married    Spouse name: Not on file   Number of children: Not on file   Years of education: Not on file   Highest education level: Not on file  Occupational History    Employer: SELF-EMPLOYED    Comment: Does not work outside the home  Tobacco Use   Smoking status: Former   Smokeless tobacco: Former    Quit date: 07/28/1992  Vaping Use   Vaping Use: Never used  Substance and Sexual Activity   Alcohol use: No   Drug use: No   Sexual activity: Not on file  Other Topics Concern   Not on file  Social History Narrative   HH of 3   No pets   Firearms locked away   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    Social Determinants of Health   Financial Resource Strain: Not on file  Food Insecurity: Not on file  Transportation Needs: Not on file  Physical Activity: Not on file  Stress: Not on file  Social Connections: Not on file  Intimate Partner Violence: Not on file   Current Outpatient Medications on File Prior to Visit  Medication Sig Dispense Refill   tirzepatide (MOUNJARO) 5 MG/0.5ML Pen Inject 5 mg into the skin once a week. 2 mL 3   Lancets (ONETOUCH DELICA PLUS LANCET33G) MISC Use to check blood sugar 3 times daily. 200 each 3   aspirin 81 MG tablet Take 81 mg by mouth daily.     atorvastatin (LIPITOR) 40 MG tablet TAKE 1/2 A TABLET BY MOUTH DAILY. 45 tablet 3   cetirizine (ZYRTEC) 10 MG tablet Take 10 mg by mouth daily.     colesevelam (WELCHOL) 625 MG tablet Take 2 tablets (1,250 mg total) by mouth daily. 180 tablet 3   Continuous Blood Gluc Sensor (FREESTYLE LIBRE 3 SENSOR) MISC Apply 1 sensor every 14 days. 6 each 3   COVID-19 mRNA bivalent vaccine, Pfizer, (PFIZER COVID-19 VAC BIVALENT) injection Inject into the muscle. 0.3 mL 0   COVID-19 mRNA vaccine 2023-2024 (COMIRNATY) syringe Inject into the muscle. 0.3 mL 0   empagliflozin (JARDIANCE) 25  MG TABS tablet Take 1 tablet (25 mg total) by mouth daily. 90 tablet 1   glucose blood test strip Use to test blood sugar 3 times daily. 200 each 3   HYDROcodone-acetaminophen (NORCO) 5-325 MG tablet Take 1 tablet by mouth every 6 (six) hours as needed. 20 tablet 0   ibuprofen (ADVIL) 800 MG tablet Take 1 tablet (800 mg total) by mouth every 8 (eight) hours as needed. 30 tablet 2   metFORMIN (GLUCOPHAGE-XR) 500 MG 24 hr tablet Take 4 tablets (2,000 mg total) by mouth daily with breakfast. 360 tablet 1  omeprazole (PRILOSEC) 40 MG capsule Take 1 capsule (40 mg total) by mouth daily before breakfast. 30 capsule 3   ondansetron (ZOFRAN) 4 MG tablet Take 1-2 tablets (4-8 mg total) by mouth every 8 (eight) hours as needed for nausea or vomiting. 20 tablet 0   pioglitazone (ACTOS) 45 MG tablet Take 1 tablet (45 mg total) by mouth daily. 90 tablet 3   Semaglutide, 1 MG/DOSE, (OZEMPIC, 1 MG/DOSE,) 4 MG/3ML SOPN Inject 1 mg into the skin once a week. 9 mL 3   No current facility-administered medications on file prior to visit.   No Known Allergies Family History  Problem Relation Age of Onset   Graves' disease Mother    Heart attack Father 31       COD   Heart failure Father    Kidney disease Father    Diabetes Brother    HIV Brother    Heart attack Maternal Grandfather    Heart attack Paternal Grandfather    Heart disease Brother 65       CABG   PE: BP 122/72   Pulse 85   Ht  (1.753 m)   Wt 278 lb (126.1 kg)   SpO2 95%   BMI 41.05 kg/m  Wt Readings from Last 3 Encounters:  11/07/22 278 lb (126.1 kg)  07/09/22 277 lb 12.5 oz (126 kg)  07/08/22 278 lb (126.1 kg)   Constitutional: overweight, in NAD Eyes: no exophthalmos ENT: no thyromegaly, no cervical lymphadenopathy Cardiovascular: RRR, No MRG Respiratory: CTA B Musculoskeletal: no deformities Skin: no rashes Neurological: no tremor with outstretched hands  ASSESSMENT: 1. DM2, non-insulin-dependent, fairly well  controlled, without long-term complications  2. HL  PLAN:  1. Patient with longstanding, uncontrolled, type 2 diabetes, on a complex medication regimen with metformin, TZD, bile acid sequestrant, SGLT2 inhibitor and weekly GLP-1 receptor agonist, with suboptimal control.  At last visit, HbA1c was higher, at 7.7%.  At that time, I suggested to change from Ozempic to [facility], for stronger effect on blood sugars - but did not change yet.  Sugars at home were actually at goal in the morning whenever he was taking metformin as prescribed, but he was forgetting doses.  I advised him to set alarms on his phone to remember to take metformin.  He was also taking a lower dose of Ozempic due to nausea (1 mg).  At that time, he was less active due to a meniscal tear, for which he had surgery 4 months ago. CGM interpretation: -At today's visit, we reviewed his CGM downloads: It appears that 85% of values are in target range (goal >70%), while 15% are higher than 180 (goal <25%), and 0% are lower than 70 (goal <4%).  The calculated average blood sugar is 157.  The projected HbA1c for the next 3 months (GMI) is 7.1% in the last 2 weeks. -Reviewing the CGM trends, sugars appear to be fairly well-controlled during the day but they increase overnight.  They are above target when he wakes up, between 180 and 200.  No lows.  He describes that he still had Ozempic at home and did not start Mounjaro.  We discussed that [facility] can improve his blood sugars further, including the ones overnight.  For now, as he is finishing Ozempic, we discussed about starting Mounjaro and we will see if we need to intensify the regimen at next visit, if sugars still remain high overnight.  Overall, after starting on the CGM, he feels that he is doing  much better, and he would like to continue with this, although he is trying to get Ozempic to be covered again.  I sent a prescription to [facility] pharmacy per his request.  If he cannot obtain this, he  will continue with freestyle libre CGM. - I suggested to:  Patient Instructions  Please continue: - Metformin ER 2000 mg at dinnertime  - Actos 45 mg daily in am - Welchol 625 x 2 tablets daily - Jardiance 25 mg before breakfast  Try to start: - Mounjaro 5 mg weekly   Please return in 4 months.  - we checked his HbA1c: 7.2% (lower) - advised to check sugars at different times of the day - 4x a day, rotating check times - advised for yearly eye exams >> he is UTD - return to clinic in 4 months  2. HL -Reviewed latest lipid panel from 05/2022: Fractions at goal: Lab Results  Component Value Date   CHOL 116 06/04/2022   HDL 42.50 06/04/2022   LDLCALC 62 06/04/2022   LDLDIRECT 114.5 02/26/2007   TRIG 58.0 06/04/2022   CHOLHDL 3 06/04/2022  -He continues on Lipitor 20 mg daily without side effects  Carlus Pavlov, MD PhD [facility] Endocrinology

## 2022-11-07 NOTE — Addendum Note (Signed)
Addended by: Shelby Dubin on: 11/07/2022 10:25 AM   Modules accepted: Orders

## 2022-11-12 ENCOUNTER — Other Ambulatory Visit (HOSPITAL_BASED_OUTPATIENT_CLINIC_OR_DEPARTMENT_OTHER): Payer: Self-pay

## 2022-11-18 ENCOUNTER — Emergency Department (HOSPITAL_BASED_OUTPATIENT_CLINIC_OR_DEPARTMENT_OTHER): Payer: 59

## 2022-11-18 ENCOUNTER — Emergency Department (HOSPITAL_BASED_OUTPATIENT_CLINIC_OR_DEPARTMENT_OTHER)
Admission: EM | Admit: 2022-11-18 | Discharge: 2022-11-18 | Disposition: A | Discharge status: Home / Self Care | Payer: 59 | Attending: Emergency Medicine | Admitting: Emergency Medicine

## 2022-11-18 ENCOUNTER — Encounter (HOSPITAL_BASED_OUTPATIENT_CLINIC_OR_DEPARTMENT_OTHER): Payer: Self-pay | Admitting: Emergency Medicine

## 2022-11-18 ENCOUNTER — Other Ambulatory Visit: Payer: Self-pay

## 2022-11-18 DIAGNOSIS — Z794 Long term (current) use of insulin: Secondary | ICD-10-CM | POA: Diagnosis not present

## 2022-11-18 DIAGNOSIS — Z7984 Long term (current) use of oral hypoglycemic drugs: Secondary | ICD-10-CM | POA: Diagnosis not present

## 2022-11-18 DIAGNOSIS — E119 Type 2 diabetes mellitus without complications: Secondary | ICD-10-CM | POA: Diagnosis not present

## 2022-11-18 DIAGNOSIS — N23 Unspecified renal colic: Secondary | ICD-10-CM | POA: Diagnosis not present

## 2022-11-18 DIAGNOSIS — R1031 Right lower quadrant pain: Secondary | ICD-10-CM | POA: Diagnosis present

## 2022-11-18 DIAGNOSIS — I10 Essential (primary) hypertension: Secondary | ICD-10-CM | POA: Diagnosis not present

## 2022-11-18 DIAGNOSIS — Z87891 Personal history of nicotine dependence: Secondary | ICD-10-CM | POA: Diagnosis not present

## 2022-11-18 DIAGNOSIS — Z7982 Long term (current) use of aspirin: Secondary | ICD-10-CM | POA: Insufficient documentation

## 2022-11-18 DIAGNOSIS — Z79899 Other long term (current) drug therapy: Secondary | ICD-10-CM | POA: Diagnosis not present

## 2022-11-18 LAB — URINALYSIS, ROUTINE W REFLEX MICROSCOPIC
Bacteria, UA: NONE SEEN
Bilirubin Urine: NEGATIVE
Glucose, UA: >1000 mg/dL — AB
Hgb urine dipstick: NEGATIVE
Ketones, ur: 15 mg/dL — AB
Leukocytes,Ua: NEGATIVE
Nitrite: NEGATIVE
Protein, ur: NEGATIVE mg/dL
Specific Gravity, Urine: 1.023 (ref 1.005–1.030)
pH: 5 (ref 5.0–8.0)

## 2022-11-18 MED ORDER — ONDANSETRON HCL 4 MG/2ML IJ SOLN
4.0000 mg | Freq: Once | INTRAMUSCULAR | Status: AC
  Administered 2022-11-18: 4 mg via INTRAVENOUS
  Filled 2022-11-18: qty 2

## 2022-11-18 MED ORDER — KETOROLAC TROMETHAMINE 15 MG/ML IJ SOLN
15.0000 mg | Freq: Once | INTRAMUSCULAR | Status: AC
  Administered 2022-11-18: 15 mg via INTRAVENOUS
  Filled 2022-11-18: qty 1

## 2022-11-18 MED ORDER — HYDROMORPHONE HCL 1 MG/ML IJ SOLN
1.0000 mg | Freq: Once | INTRAMUSCULAR | Status: AC
  Administered 2022-11-18: 1 mg via INTRAVENOUS
  Filled 2022-11-18: qty 1

## 2022-11-18 NOTE — ED Triage Notes (Signed)
Lower right side abd pain, woke from sleep, sharp stabbing constant but intensifies in waves. Hx of kidney stone in far past feel same.

## 2022-11-18 NOTE — ED Provider Notes (Signed)
[facility] Provider Note: Lowella Dell, MD, FACEP  CSN: [id number] MRN: [id number] ARRIVAL: 11/18/22 at 0135 ROOM: DB011/DB011   CHIEF COMPLAINT  Abdominal Pain   HISTORY OF PRESENT ILLNESS  11/18/22 1:49 AM Alfred Johnson is a 52 y.o. male who was awakened from sleep this morning with right lower quadrant abdominal pain.  The pain is sharp and stabbing.  It is constantly present but waxes and wanes.  He rates it as a 10 out of 10.  It feels like previous kidney stone.  He has had nausea with it but no vomiting.  He he denies urinary symptoms except he was unable to void much the last time he urinated.   Past Medical History:  Diagnosis Date   Acute medial meniscus tear of right knee 01/05/2018   ANEMIA DUE TO CHRONIC BLOOD LOSS 09/10/2009   Diabetes mellitus    Elevated BP    GERD (gastroesophageal reflux disease)    HYPERLIPIDEMIA, MIXED 03/23/2007   Hypertension    KIDNEY STONE 02/05/2009   Left lower lobe pneumonia 02/08/2014   VENTRICULAR FUNCTION, DECREASED 06/21/2007    Past Surgical History:  Procedure Laterality Date   COLONOSCOPY WITH PROPOFOL N/A 10/02/2021   Procedure: COLONOSCOPY WITH PROPOFOL;  Surgeon: Toney Reil, MD;  Location: [facility] ENDOSCOPY;  Service: Gastroenterology;  Laterality: N/A;   ESOPHAGOGASTRODUODENOSCOPY N/A 10/02/2021   Procedure: ESOPHAGOGASTRODUODENOSCOPY (EGD);  Surgeon: Toney Reil, MD;  Location: [facility] ENDOSCOPY;  Service: Gastroenterology;  Laterality: N/A;   KNEE ARTHROSCOPY WITH MEDIAL MENISECTOMY Left 07/09/2022   Procedure: LEFT KNEE ARTHROSCOPY, DEBRIDEMENT AND PARTIAL MEDIAL MENISCECTOMY;  Surgeon: Tarry Kos, MD;  Location: [facility];  Service: Orthopedics;  Laterality: Left;    Family History  Problem Relation Age of Onset   Graves' disease Mother    Heart attack Father 69       COD   Heart failure Father    Kidney disease Father    Diabetes Brother    HIV Brother    Heart attack  Maternal Grandfather    Heart attack Paternal Grandfather    Heart disease Brother 82       CABG    Social History   Tobacco Use   Smoking status: Former   Smokeless tobacco: Former    Quit date: 07/28/1992  Vaping Use   Vaping Use: Never used  Substance Use Topics   Alcohol use: No   Drug use: No    Prior to Admission medications   Medication Sig Start Date End Date Taking? Authorizing Provider  aspirin 81 MG tablet Take 81 mg by mouth daily.    [provider]  atorvastatin (LIPITOR) 40 MG tablet TAKE 1/2 A TABLET BY MOUTH DAILY. 01/10/22   Hilty, Lisette Abu, MD  cetirizine (ZYRTEC) 10 MG tablet Take 10 mg by mouth daily.    [provider]  colesevelam (WELCHOL) 625 MG tablet Take 2 tablets (1,250 mg total) by mouth daily. 09/22/22   Carlus Pavlov, MD  Continuous Blood Gluc Sensor (DEXCOM G7 SENSOR) MISC 3 each by Does not apply route every 30 (thirty) days. Apply 1 sensor every 10 days 11/07/22   Carlus Pavlov, MD  Continuous Blood Gluc Sensor (FREESTYLE LIBRE 3 SENSOR) MISC Apply 1 sensor every 14 days. 08/04/22   Carlus Pavlov, MD  empagliflozin (JARDIANCE) 25 MG TABS tablet Take 1 tablet (25 mg total) by mouth daily. 06/09/22   Carlus Pavlov, MD  glucose blood test strip Use to test  blood sugar 3 times daily. 07/08/22   Carlus Pavlov, MD  Lancets [facility] DELICA PLUS Iantha) MISC Use to check blood sugar 3 times daily. 07/08/22     metFORMIN (GLUCOPHAGE-XR) 500 MG 24 hr tablet Take 4 tablets (2,000 mg total) by mouth daily with breakfast. 06/09/22   Carlus Pavlov, MD  omeprazole (PRILOSEC) 40 MG capsule Take 1 capsule (40 mg total) by mouth daily before breakfast. 06/09/22 10/07/22  Vanga, Loel Dubonnet, MD  pioglitazone (ACTOS) 45 MG tablet Take 1 tablet (45 mg total) by mouth daily. 09/16/22   Carlus Pavlov, MD    Allergies Patient has no known allergies.   REVIEW OF SYSTEMS  Negative except as noted here or in the History of  Present Illness.   PHYSICAL EXAMINATION  Initial Vital Signs Blood pressure (!) 141/92, pulse 78, temperature 97.7 F (36.5 C), temperature source Oral, resp. rate 20, SpO2 97 %.  Examination General: Well-developed, well-nourished male in no acute distress; appearance consistent with age of record HENT: normocephalic; atraumatic Eyes: Normal appearance Neck: supple Heart: regular rate and rhythm Lungs: clear to auscultation bilaterally Abdomen: soft; nondistended; right lower quadrant tenderness on deep palpation; bowel sounds present Extremities: No deformity; full range of motion; pulses normal Neurologic: Awake, alert and oriented; motor function intact in all extremities and symmetric; no facial droop Skin: Warm and dry Psychiatric: Grimacing   RESULTS  Summary of this visit's results, reviewed and interpreted by myself:   EKG Interpretation  Date/Time:    Ventricular Rate:    PR Interval:    QRS Duration:   QT Interval:    QTC Calculation:   R Axis:     Text Interpretation:         Laboratory Studies: Results for orders placed or performed during the hospital encounter of 11/18/22 (from the past 24 hour(s))  Urinalysis, Routine w reflex microscopic -Urine, Clean Catch     Status: Abnormal   Collection Time: 11/18/22  1:54 AM  Result Value Ref Range   Color, Urine COLORLESS (A) YELLOW   APPearance CLEAR CLEAR   Specific Gravity, Urine 1.023 1.005 - 1.030   pH 5.0 5.0 - 8.0   Glucose, UA >1,000 (A) NEGATIVE mg/dL   Hgb urine dipstick NEGATIVE NEGATIVE   Bilirubin Urine NEGATIVE NEGATIVE   Ketones, ur 15 (A) NEGATIVE mg/dL   Protein, ur NEGATIVE NEGATIVE mg/dL   Nitrite NEGATIVE NEGATIVE   Leukocytes,Ua NEGATIVE NEGATIVE   RBC / HPF 0-5 0 - 5 RBC/hpf   WBC, UA 0-5 0 - 5 WBC/hpf   Bacteria, UA NONE SEEN NONE SEEN   Squamous Epithelial / HPF 0-5 0 - 5 /HPF   Imaging Studies: CT Renal Stone Study  Result Date: 11/18/2022 CLINICAL DATA:  Lower right-sided  abdominal pain. History of kidney stone. EXAM: CT ABDOMEN AND PELVIS WITHOUT CONTRAST TECHNIQUE: Multidetector CT imaging of the abdomen and pelvis was performed following the standard protocol without IV contrast. RADIATION DOSE REDUCTION: This exam was performed according to the departmental dose-optimization program which includes automated exposure control, adjustment of the mA and/or kV according to patient size and/or use of iterative reconstruction technique. COMPARISON:  02/27/2016. FINDINGS: Lower chest: There is a 6 mm nodule in the right lower lobe, axial image 7. Hepatobiliary: No focal liver abnormality is seen. No gallstones, gallbladder wall thickening, or biliary dilatation. Pancreas: Unremarkable. No pancreatic ductal dilatation or surrounding inflammatory changes. Spleen: Normal in size without focal abnormality. Adrenals/Urinary Tract: No adrenal nodule or mass. No renal calculus  or hydronephrosis bilaterally. There are suspected parapelvic cysts on the left. No hydroureteronephrosis. A 2 mm calcification is noted in the urinary bladder on the right. Stomach/Bowel: Stomach is within normal limits. Appendix appears normal. No evidence of bowel wall thickening, distention, or inflammatory changes. No free air or pneumatosis. Scattered diverticula are present along the colon without evidence of diverticulitis. Vascular/Lymphatic: Aortic atherosclerosis. No enlarged abdominal or pelvic lymph nodes. Reproductive: Prostate is unremarkable. Other: No abdominopelvic ascites. Fat containing inguinal hernias present on the left. Musculoskeletal: Degenerative changes are present in the thoracolumbar spine. No acute osseous abnormality. IMPRESSION: 1. 2 mm calculus in the urinary bladder on the right, suggesting recently passed stone. 2. No renal calculus or obstructive uropathy. 3. 6 mm right lower lobe pulmonary nodule, not significantly changed from 2017 and likely benign. 4. Diverticulosis without  diverticulitis. 5. Aortic atherosclerosis. Electronically Signed   By: Thornell Sartorius M.D.   On: 11/18/2022 02:34    ED COURSE and MDM  Nursing notes, initial and subsequent vitals signs, including pulse oximetry, reviewed and interpreted by myself.  Vitals:   11/18/22 0146  BP: (!) 141/92  Pulse: 78  Resp: 20  Temp: 97.7 F (36.5 C)  TempSrc: Oral  SpO2: 97%   Medications  ondansetron (ZOFRAN) injection 4 mg (4 mg Intravenous Given 11/18/22 0200)  HYDROmorphone (DILAUDID) injection 1 mg (1 mg Intravenous Given 11/18/22 0200)  ketorolac (TORADOL) 15 MG/ML injection 15 mg (15 mg Intravenous Given 11/18/22 0242)   Passed stone seen in urinary bladder on CT scan.   2:53 AM Pain significantly improved after IV medications and passage of stone.  No evidence of urinary tract infection on urinalysis.  PROCEDURES  Procedures   ED DIAGNOSES     ICD-10-CM   1. Ureteral colic  N23          Kaeleen Odom, MD 11/18/22 [phone]

## 2022-11-22 ENCOUNTER — Encounter: Payer: Self-pay | Admitting: Internal Medicine

## 2022-11-23 ENCOUNTER — Other Ambulatory Visit (HOSPITAL_BASED_OUTPATIENT_CLINIC_OR_DEPARTMENT_OTHER): Payer: Self-pay

## 2022-11-24 ENCOUNTER — Other Ambulatory Visit (HOSPITAL_BASED_OUTPATIENT_CLINIC_OR_DEPARTMENT_OTHER): Payer: Self-pay

## 2022-11-24 ENCOUNTER — Other Ambulatory Visit: Payer: Self-pay

## 2022-11-25 ENCOUNTER — Other Ambulatory Visit (HOSPITAL_BASED_OUTPATIENT_CLINIC_OR_DEPARTMENT_OTHER): Payer: Self-pay

## 2022-11-25 ENCOUNTER — Other Ambulatory Visit: Payer: Self-pay | Admitting: Internal Medicine

## 2022-11-25 DIAGNOSIS — E119 Type 2 diabetes mellitus without complications: Secondary | ICD-10-CM

## 2022-11-25 MED ORDER — METFORMIN HCL ER 500 MG PO TB24
2000.0000 mg | ORAL_TABLET | Freq: Every day | ORAL | 1 refills | Status: DC
Start: 2022-11-25 — End: 2023-05-26
  Filled 2022-11-25: qty 120, 30d supply, fill #0
  Filled 2022-12-27 (×2): qty 120, 30d supply, fill #1
  Filled 2023-02-05: qty 120, 30d supply, fill #2
  Filled 2023-03-03: qty 120, 30d supply, fill #3
  Filled 2023-04-02: qty 120, 30d supply, fill #4
  Filled 2023-04-30: qty 120, 30d supply, fill #5

## 2022-11-29 ENCOUNTER — Other Ambulatory Visit (HOSPITAL_BASED_OUTPATIENT_CLINIC_OR_DEPARTMENT_OTHER): Payer: Self-pay

## 2022-11-29 ENCOUNTER — Other Ambulatory Visit: Payer: Self-pay | Admitting: Internal Medicine

## 2022-12-01 ENCOUNTER — Other Ambulatory Visit: Payer: Self-pay

## 2022-12-01 ENCOUNTER — Other Ambulatory Visit: Payer: Self-pay | Admitting: Internal Medicine

## 2022-12-01 ENCOUNTER — Encounter: Payer: Self-pay | Admitting: Internal Medicine

## 2022-12-01 ENCOUNTER — Other Ambulatory Visit (HOSPITAL_BASED_OUTPATIENT_CLINIC_OR_DEPARTMENT_OTHER): Payer: Self-pay

## 2022-12-01 MED ORDER — MOUNJARO 5 MG/0.5ML ~~LOC~~ SOAJ
5.0000 mg | SUBCUTANEOUS | 3 refills | Status: DC
  Filled 2022-12-01: qty 2, 28d supply, fill #0
  Filled 2022-12-24: qty 2, 28d supply, fill #1
  Filled 2023-02-05: qty 2, 28d supply, fill #2
  Filled 2023-03-01: qty 2, 28d supply, fill #3

## 2022-12-02 ENCOUNTER — Other Ambulatory Visit: Payer: Self-pay | Admitting: Internal Medicine

## 2022-12-02 ENCOUNTER — Other Ambulatory Visit: Payer: Self-pay

## 2022-12-02 ENCOUNTER — Other Ambulatory Visit (HOSPITAL_BASED_OUTPATIENT_CLINIC_OR_DEPARTMENT_OTHER): Payer: Self-pay

## 2022-12-02 DIAGNOSIS — E119 Type 2 diabetes mellitus without complications: Secondary | ICD-10-CM

## 2022-12-02 MED ORDER — EMPAGLIFLOZIN 25 MG PO TABS
25.0000 mg | ORAL_TABLET | Freq: Every day | ORAL | 3 refills | Status: DC
Start: 2022-12-02 — End: 2023-08-06
  Filled 2022-12-02: qty 30, 30d supply, fill #0
  Filled 2022-12-27 (×2): qty 30, 30d supply, fill #1
  Filled 2023-02-05: qty 30, 30d supply, fill #2
  Filled 2023-03-03: qty 30, 30d supply, fill #3
  Filled 2023-04-02: qty 30, 30d supply, fill #4
  Filled 2023-04-30: qty 30, 30d supply, fill #5
  Filled 2023-05-28: qty 30, 30d supply, fill #6
  Filled 2023-06-27 (×2): qty 30, 30d supply, fill #7
  Filled 2023-07-24: qty 30, 30d supply, fill #8

## 2022-12-10 ENCOUNTER — Ambulatory Visit (INDEPENDENT_AMBULATORY_CARE_PROVIDER_SITE_OTHER): Payer: 59 | Admitting: Orthopaedic Surgery

## 2022-12-10 DIAGNOSIS — M25562 Pain in left knee: Secondary | ICD-10-CM | POA: Diagnosis not present

## 2022-12-10 DIAGNOSIS — Z9889 Other specified postprocedural states: Secondary | ICD-10-CM | POA: Diagnosis not present

## 2022-12-10 MED ORDER — LIDOCAINE HCL 1 % IJ SOLN
2.0000 mL | INTRAMUSCULAR | Status: AC | PRN
Start: 2022-12-10 — End: 2022-12-10
  Administered 2022-12-10: 2 mL

## 2022-12-10 MED ORDER — BUPIVACAINE HCL 0.5 % IJ SOLN
2.0000 mL | INTRAMUSCULAR | Status: AC | PRN
Start: 2022-12-10 — End: 2022-12-10
  Administered 2022-12-10: 2 mL via INTRA_ARTICULAR

## 2022-12-10 MED ORDER — METHYLPREDNISOLONE ACETATE 40 MG/ML IJ SUSP
40.0000 mg | INTRAMUSCULAR | Status: AC | PRN
Start: 2022-12-10 — End: 2022-12-10
  Administered 2022-12-10: 40 mg via INTRA_ARTICULAR

## 2022-12-10 NOTE — Progress Notes (Signed)
Office Visit Note   Patient: Alfred Johnson           Date of Birth: 20-Aug-1970           MRN: [id number] Visit Date: 12/10/2022              Requested by: Alfred Kos, MD [address] [place],  [place] [address] PCP: Alfred Headings, MD   Assessment & Plan: Visit Diagnoses:  1. S/P left knee arthroscopy     Plan: Impression is 52 year old gentleman with continued left knee medial sided knee pain.  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  Based on options we agreed to try steroid injection today.  He should follow-up in about 4 weeks if the symptoms persist.  Follow-Up Instructions: No follow-ups on file.   Orders:  No orders of the defined types were placed in this encounter.  No orders of the defined types were placed in this encounter.     Procedures: Large Joint Inj: L knee on 12/10/2022 8:18 AM Details: 22 G needle Medications: 2 mL bupivacaine 0.5 %; 2 mL lidocaine 1 %; 40 mg methylPREDNISolone acetate 40 MG/ML Outcome: tolerated well, no immediate complications Patient was prepped and draped in the usual sterile fashion.       Clinical Data: No additional findings.   Subjective: Chief Complaint  Patient presents with   Left Knee - Pain    HPI Alfred Johnson returns today for continued medial sided left knee pain.  He underwent left knee arthroscopy with partial medial meniscectomy about 5 months ago.  He states that he still has occasional pain on the medial side with occasional weakness and giving way. Review of Systems   Objective: Vital Signs: There were no vitals taken for this visit.  Physical Exam  Ortho Exam Examination of left knee shows fully healed surgical scars.  Pain along the medial joint line.  No joint effusion. Specialty Comments:  No specialty comments available.  Imaging: No results found.   PMFS History: Patient Active Problem List   Diagnosis Date Noted   Acute  medial meniscus tear of left knee 06/26/2022   Colon cancer screening    Polyp of descending colon    S/P right knee arthroscopy 04/15/2018   Lung nodule 02/11/2016   Diabetes mellitus without complication (HCC) 02/11/2016   BMI 35.0-35.9,adult 06/01/2012   Visit for preventive health examination 06/01/2012   Cough 07/09/2010   WEIGHT GAIN 09/10/2009   KIDNEY STONE 02/05/2009   Diabetes (HCC) 03/23/2007   HYPERLIPIDEMIA, MIXED 03/23/2007   OBESITY NOS 02/25/2007   Essential hypertension 02/25/2007   Hiatal hernia with GERD without esophagitis 02/11/2007   Past Medical History:  Diagnosis Date   Acute medial meniscus tear of right knee 01/05/2018   ANEMIA DUE TO CHRONIC BLOOD LOSS 09/10/2009   Diabetes mellitus    Elevated BP    GERD (gastroesophageal reflux disease)    HYPERLIPIDEMIA, MIXED 03/23/2007   Hypertension    KIDNEY STONE 02/05/2009   Left lower lobe pneumonia 02/08/2014   VENTRICULAR FUNCTION, DECREASED 06/21/2007    Family History  Problem Relation Age of Onset   Graves' disease Mother    Heart attack Father 104       COD   Heart failure Father    Kidney disease Father    Diabetes Brother    HIV Brother    Heart attack Maternal Grandfather    Heart attack Paternal  Grandfather    Heart disease Brother 54       CABG    Past Surgical History:  Procedure Laterality Date   COLONOSCOPY WITH PROPOFOL N/A 10/02/2021   Procedure: COLONOSCOPY WITH PROPOFOL;  Surgeon: Toney Reil, MD;  Location: [facility] ENDOSCOPY;  Service: Gastroenterology;  Laterality: N/A;   ESOPHAGOGASTRODUODENOSCOPY N/A 10/02/2021   Procedure: ESOPHAGOGASTRODUODENOSCOPY (EGD);  Surgeon: Toney Reil, MD;  Location: [facility] ENDOSCOPY;  Service: Gastroenterology;  Laterality: N/A;   KNEE ARTHROSCOPY WITH MEDIAL MENISECTOMY Left 07/09/2022   Procedure: LEFT KNEE ARTHROSCOPY, DEBRIDEMENT AND PARTIAL MEDIAL MENISCECTOMY;  Surgeon: Alfred Kos, MD;  Location: [facility];   Service: Orthopedics;  Laterality: Left;   Social History   Occupational History    Employer: SELF-EMPLOYED    Comment: Does not work outside the home  Tobacco Use   Smoking status: Former   Smokeless tobacco: Former    Quit date: 07/28/1992  Vaping Use   Vaping Use: Never used  Substance and Sexual Activity   Alcohol use: No   Drug use: No   Sexual activity: Not on file

## 2022-12-20 ENCOUNTER — Other Ambulatory Visit (HOSPITAL_BASED_OUTPATIENT_CLINIC_OR_DEPARTMENT_OTHER): Payer: Self-pay

## 2022-12-27 ENCOUNTER — Other Ambulatory Visit (HOSPITAL_BASED_OUTPATIENT_CLINIC_OR_DEPARTMENT_OTHER): Payer: Self-pay

## 2022-12-30 ENCOUNTER — Other Ambulatory Visit: Payer: Self-pay | Admitting: Internal Medicine

## 2022-12-30 ENCOUNTER — Other Ambulatory Visit (HOSPITAL_BASED_OUTPATIENT_CLINIC_OR_DEPARTMENT_OTHER): Payer: Self-pay

## 2022-12-30 MED ORDER — ATORVASTATIN CALCIUM 40 MG PO TABS
20.0000 mg | ORAL_TABLET | Freq: Every day | ORAL | 0 refills | Status: DC
  Filled 2022-12-30: qty 15, 30d supply, fill #0
  Filled 2023-02-05: qty 15, 30d supply, fill #1
  Filled 2023-03-03: qty 15, 30d supply, fill #2
  Filled 2023-04-02: qty 15, 30d supply, fill #3

## 2023-01-12 ENCOUNTER — Other Ambulatory Visit (HOSPITAL_BASED_OUTPATIENT_CLINIC_OR_DEPARTMENT_OTHER): Payer: Self-pay

## 2023-01-19 ENCOUNTER — Other Ambulatory Visit (HOSPITAL_BASED_OUTPATIENT_CLINIC_OR_DEPARTMENT_OTHER): Payer: Self-pay

## 2023-02-05 ENCOUNTER — Other Ambulatory Visit (HOSPITAL_BASED_OUTPATIENT_CLINIC_OR_DEPARTMENT_OTHER): Payer: Self-pay

## 2023-02-08 ENCOUNTER — Other Ambulatory Visit (HOSPITAL_BASED_OUTPATIENT_CLINIC_OR_DEPARTMENT_OTHER): Payer: Self-pay

## 2023-03-02 ENCOUNTER — Other Ambulatory Visit (HOSPITAL_BASED_OUTPATIENT_CLINIC_OR_DEPARTMENT_OTHER): Payer: Self-pay

## 2023-03-06 ENCOUNTER — Other Ambulatory Visit (HOSPITAL_BASED_OUTPATIENT_CLINIC_OR_DEPARTMENT_OTHER): Payer: Self-pay

## 2023-03-07 ENCOUNTER — Other Ambulatory Visit (HOSPITAL_BASED_OUTPATIENT_CLINIC_OR_DEPARTMENT_OTHER): Payer: Self-pay

## 2023-03-09 ENCOUNTER — Other Ambulatory Visit (HOSPITAL_BASED_OUTPATIENT_CLINIC_OR_DEPARTMENT_OTHER): Payer: Self-pay

## 2023-03-16 ENCOUNTER — Other Ambulatory Visit (HOSPITAL_BASED_OUTPATIENT_CLINIC_OR_DEPARTMENT_OTHER): Payer: Self-pay

## 2023-03-23 ENCOUNTER — Ambulatory Visit (INDEPENDENT_AMBULATORY_CARE_PROVIDER_SITE_OTHER): Payer: 59 | Admitting: Internal Medicine

## 2023-03-23 ENCOUNTER — Encounter: Payer: Self-pay | Admitting: Internal Medicine

## 2023-03-23 VITALS — BP 130/90 | HR 86 | Ht 69.0 in | Wt 277.2 lb

## 2023-03-23 DIAGNOSIS — Z7984 Long term (current) use of oral hypoglycemic drugs: Secondary | ICD-10-CM

## 2023-03-23 DIAGNOSIS — Z794 Long term (current) use of insulin: Secondary | ICD-10-CM

## 2023-03-23 DIAGNOSIS — E1165 Type 2 diabetes mellitus with hyperglycemia: Secondary | ICD-10-CM | POA: Diagnosis not present

## 2023-03-23 DIAGNOSIS — Z7985 Long-term (current) use of injectable non-insulin antidiabetic drugs: Secondary | ICD-10-CM

## 2023-03-23 DIAGNOSIS — E785 Hyperlipidemia, unspecified: Secondary | ICD-10-CM | POA: Diagnosis not present

## 2023-03-23 LAB — POCT GLYCOSYLATED HEMOGLOBIN (HGB A1C): Hemoglobin A1C: 7.4 % — AB (ref 4.0–5.6)

## 2023-03-23 MED ORDER — TIRZEPATIDE 10 MG/0.5ML ~~LOC~~ SOAJ
10.0000 mg | SUBCUTANEOUS | 3 refills | Status: DC
  Filled 2023-03-24 – 2023-04-02 (×2): qty 2, 28d supply, fill #0
  Filled 2023-04-26: qty 2, 28d supply, fill #1
  Filled 2023-05-24: qty 2, 28d supply, fill #2
  Filled 2023-06-21: qty 2, 28d supply, fill #3
  Filled 2023-07-16: qty 2, 28d supply, fill #4

## 2023-03-23 NOTE — Progress Notes (Signed)
Patient ID: Alfred Johnson, male   DOB: [birth year], 52 y.o.   MRN: [id number]  HPI: Alfred Johnson is a 52 y.o.-year-old male, returning for follow-up for DM2, dx in 2008, non-insulin-dependent, fairly well controlled, with complications (cardiovascular disease - Aortic atherosclerosis). Pt. previously saw Dr. Everardo All, last visit with me 4 months ago. He will change insurance soon: from [place] to [employer].  Interim history He has increased urination, but no blurry vision, no chest pain. He had a torn meniscus >> left knee surgery 07/09/2022.  He still has some pain -plans to go back and discussed with Dr. Deno Etienne. He had a kidney stone 10/2022.  CT abdomen performed at that time showed aortic atherosclerosis.  Reviewed HbA1c: Lab Results  Component Value Date   HGBA1C 7.2 (A) 11/07/2022   HGBA1C 7.7 (A) 07/08/2022   HGBA1C 7.2 (A) 03/17/2022   HGBA1C 7.1 (A) 10/07/2021   HGBA1C 7.2 (A) 06/05/2021   HGBA1C 7.7 (H) 05/27/2021   HGBA1C 7.5 (A) 02/05/2021   HGBA1C 7.0 (A) 10/02/2020   HGBA1C 7.1 (A) 05/30/2020   HGBA1C 7.2 (A) 02/07/2020   Pt is on a regimen of: - Metformin ER 2000 mg in am >> at dinnertime (missing less doses - maybe 1-2 a mo) - Actos 45 mg daily in am - Welchol 625 x 2 tablets daily in am  - Jardiance 25 mg before breakfast - increased urination - Ozempic 2 mg weekly - hypersensitivity to foods, nausea, occasional vomiting >> alternating 1 with 2 mg weekly >> 1 mg weekly >> Mounjaro 5 mg weekly She previously tried Invokana >> itching. We stopped bromocriptine 02/2022  Pt checks his sugars >4x a day with his CGM:  Prev.:   Lowest sugar was 90s >> 90 >> 80s; he has hypoglycemia awareness at 90.  Highest sugar was 190 >> 180 >> 292 >> 279.  Glucometer: One Touch ultra  - no CKD, last BUN/creatinine:  Lab Results  Component Value Date   BUN 13 07/09/2022   BUN 17 06/04/2022   CREATININE 0.81 07/09/2022   CREATININE 0.76 06/04/2022   Lab Results   Component Value Date   MICRALBCREAT 1.1 06/04/2022   MICRALBCREAT 1.5 05/27/2021   MICRALBCREAT 1.1 08/19/2019   MICRALBCREAT 0.9 04/14/2017   MICRALBCREAT 0.5 05/30/2015   MICRALBCREAT 0.6 05/30/2014   MICRALBCREAT 0.2 05/17/2013   MICRALBCREAT 1.1 06/16/2012   MICRALBCREAT 1.1 05/25/2012   MICRALBCREAT 0.4 05/21/2011  On Jardiance.  -+ HL; last set of lipids: Lab Results  Component Value Date   CHOL 116 06/04/2022   HDL 42.50 06/04/2022   LDLCALC 62 06/04/2022   LDLDIRECT 114.5 02/26/2007   TRIG 58.0 06/04/2022   CHOLHDL 3 06/04/2022  On Lipitor 20 mg daily.  - last eye exam was in 04/2022. No DR reportedly but in 2021: + DR.   - no numbness and tingling in his feet.  Latest foot exam was by Dr. Charlsie Merles 02/03/2022. Not seeing podiatry anymore.  He also has a history of kidney stones, GERD, HTN, obesity. Per review of Dr. George Hugh note, he declined weight loss surgery in the past. Cardiology investigation: LP(a) (06/11/2022) was elevated at 81.9, coronary calcium score was also elevated (06/10/2021) at 383.  ROS: + see HPI  Past Medical History:  Diagnosis Date   Acute medial meniscus tear of right knee 01/05/2018   ANEMIA DUE TO CHRONIC BLOOD LOSS 09/10/2009   Diabetes mellitus    Elevated BP    GERD (gastroesophageal reflux disease)  HYPERLIPIDEMIA, MIXED 03/23/2007   Hypertension    KIDNEY STONE 02/05/2009   Left lower lobe pneumonia 02/08/2014   VENTRICULAR FUNCTION, DECREASED 06/21/2007   Past Surgical History:  Procedure Laterality Date   COLONOSCOPY WITH PROPOFOL N/A 10/02/2021   Procedure: COLONOSCOPY WITH PROPOFOL;  Surgeon: Toney Reil, MD;  Location: [facility] ENDOSCOPY;  Service: Gastroenterology;  Laterality: N/A;   ESOPHAGOGASTRODUODENOSCOPY N/A 10/02/2021   Procedure: ESOPHAGOGASTRODUODENOSCOPY (EGD);  Surgeon: Toney Reil, MD;  Location: [facility] ENDOSCOPY;  Service: Gastroenterology;  Laterality: N/A;   KNEE ARTHROSCOPY WITH MEDIAL  MENISECTOMY Left 07/09/2022   Procedure: LEFT KNEE ARTHROSCOPY, DEBRIDEMENT AND PARTIAL MEDIAL MENISCECTOMY;  Surgeon: Tarry Kos, MD;  Location: [facility];  Service: Orthopedics;  Laterality: Left;   Social History   Socioeconomic History   Marital status: Married    Spouse name: Not on file   Number of children: Not on file   Years of education: Not on file   Highest education level: Not on file  Occupational History    Employer: SELF-EMPLOYED    Comment: Does not work outside the home  Tobacco Use   Smoking status: Former   Smokeless tobacco: Former    Quit date: 07/28/1992  Vaping Use   Vaping status: Never Used  Substance and Sexual Activity   Alcohol use: No   Drug use: No   Sexual activity: Not on file  Other Topics Concern   Not on file  Social History Narrative   HH of 3   No pets   Firearms locked away   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    Social Determinants of Health   Financial Resource Strain: Not on file  Food Insecurity: Not on file  Transportation Needs: Not on file  Physical Activity: Not on file  Stress: Not on file  Social Connections: Not on file  Intimate Partner Violence: Not on file   Current Outpatient Medications on File Prior to Visit  Medication Sig Dispense Refill   aspirin 81 MG tablet Take 81 mg by mouth daily.     atorvastatin (LIPITOR) 40 MG tablet Take 0.5 tablets (20 mg total) by mouth daily. 60 tablet 0   cetirizine (ZYRTEC) 10 MG tablet Take 10 mg by mouth daily.     colesevelam (WELCHOL) 625 MG tablet Take 2 tablets (1,250 mg total) by mouth daily. 180 tablet 3   Continuous Blood Gluc Sensor (DEXCOM G7 SENSOR) MISC 3 each by Does not apply route every 30 (thirty) days. Apply 1 sensor every 10 days 9 each 4   Continuous Glucose Sensor (FREESTYLE LIBRE 3 SENSOR) MISC Apply 1 sensor every 14 days. 6 each 3   empagliflozin  (JARDIANCE) 25 MG TABS tablet Take 1 tablet (25 mg total) by mouth daily. 90 tablet 3   metFORMIN (GLUCOPHAGE-XR) 500 MG 24 hr tablet Take 4 tablets (2,000 mg total) by mouth daily with breakfast. 360 tablet 1   omeprazole (PRILOSEC) 40 MG capsule Take 1 capsule (40 mg total) by mouth daily before breakfast. 30 capsule 3   pioglitazone (ACTOS) 45 MG tablet Take 1 tablet (45 mg total) by mouth daily. 90 tablet 3   tirzepatide (MOUNJARO) 5 MG/0.5ML Pen Inject 5 mg into the skin once a week. 2 mL 3   No current facility-administered medications on file prior to visit.   No Known Allergies Family  History  Problem Relation Age of Onset   Graves' disease Mother    Heart attack Father 60       COD   Heart failure Father    Kidney disease Father    Diabetes Brother    HIV Brother    Heart attack Maternal Grandfather    Heart attack Paternal Grandfather    Heart disease Brother 23       CABG   PE: BP (!) 130/90   Pulse 86   Ht 5\' 9"  (1.753 m)   Wt 277 lb 3.2 oz (125.7 kg)   SpO2 97%   BMI 40.94 kg/m  Wt Readings from Last 3 Encounters:  03/23/23 277 lb 3.2 oz (125.7 kg)  11/07/22 278 lb (126.1 kg)  07/09/22 277 lb 12.5 oz (126 kg)   Constitutional: overweight, in NAD Eyes: no exophthalmos ENT: no thyromegaly, no cervical lymphadenopathy Cardiovascular: RRR, No MRG Respiratory: CTA B Musculoskeletal: no deformities Skin: no rashes Neurological: no tremor with outstretched hands  ASSESSMENT: 1. DM2, non-insulin-dependent, fairly well controlled, with complications -Aortic atherosclerosis-on CT (11/18/2022)  2. HL  PLAN:  1. Patient with longstanding, uncontrolled, type 2 diabetes, on a complex medication regimen with metformin, TZD, bile acid sequestrant, SGLT2 inhibitor, and also GLP-1/GIP receptor: Ozempic changed to Mounjaro at last visit.  At that time, reviewing the CGM trends, sugars appears to be fairly well-controlled during the day but they were increasing overnight.   They were above target at waking up, between 180 and 200s.  No lows.  He was finishing Ozempic and I advised him to switch to [facility] for stronger effect on his blood sugars.  He felt that he was doing much better after he started the CGM.  HbA1c at last visit was lower, at 7.2%. CGM interpretation: -At today's visit, we reviewed his CGM downloads: It appears that 69% of values are in target range (goal >70%), while 31% are higher than 180 (goal <25%), and 0% are lower than 70 (goal <4%).  The calculated average blood sugar is 165.  The projected HbA1c for the next 3 months (GMI) is 7.3%. -Reviewing the CGM trends, sugars appear to be slightly higher than before, fluctuating in the upper half of the target range, but increasing gradually overnight, peaking above 180s in the morning.  We discussed about options for treatment.  He feels that he would benefit from a higher Mounjaro dose and I agree.  We discussed the Greggory Keen will help with both blood sugars after meals, but also fasting sugars.  He tolerates well the lower dose.  He still has 5 mg pens at home and for now we will double up on these, but we did discuss that if he has nausea, to let me know, in which case, we will space the 5 mg doses 4 to 5 days apart.  Will continue the rest of the regimen for now. - I suggested to:  Patient Instructions  Please continue: - Metformin ER 2000 mg at dinnertime  - Actos 45 mg daily in am - Welchol 625 x 2 tablets daily - Jardiance 25 mg before breakfast  Please increase: - Mounjaro 10 mg weekly   Please return in 4 months.  - we checked his HbA1c: 7.4% (higher) - advised to check sugars at different times of the day - 4x a day, rotating check times - advised for yearly eye exams >> he is UTD - return to clinic in 4 months  2. HL -Reviewed latest lipid panel  from 05/2022: Fractions at goal: Lab Results  Component Value Date   CHOL 116 06/04/2022   HDL 42.50 06/04/2022   LDLCALC 62 06/04/2022    LDLDIRECT 114.5 02/26/2007   TRIG 58.0 06/04/2022   CHOLHDL 3 06/04/2022  -He continues Lipitor 20 mg daily without side effects  Carlus Pavlov, MD PhD [facility] Endocrinology

## 2023-03-23 NOTE — Patient Instructions (Addendum)
Please continue: - Metformin ER 2000 mg at dinnertime  - Actos 45 mg daily in am - Welchol 625 x 2 tablets daily - Jardiance 25 mg before breakfast  Please increase: - Mounjaro 10 mg weekly   Please return in 4 months.

## 2023-03-24 ENCOUNTER — Other Ambulatory Visit (HOSPITAL_BASED_OUTPATIENT_CLINIC_OR_DEPARTMENT_OTHER): Payer: Self-pay

## 2023-04-01 ENCOUNTER — Ambulatory Visit (INDEPENDENT_AMBULATORY_CARE_PROVIDER_SITE_OTHER): Payer: 59 | Admitting: Orthopaedic Surgery

## 2023-04-01 ENCOUNTER — Encounter: Payer: Self-pay | Admitting: Orthopaedic Surgery

## 2023-04-01 VITALS — Ht 69.0 in | Wt 277.0 lb

## 2023-04-01 DIAGNOSIS — M1712 Unilateral primary osteoarthritis, left knee: Secondary | ICD-10-CM

## 2023-04-01 NOTE — Progress Notes (Signed)
Office Visit Note   Patient: Alfred Johnson           Date of Birth: 04-25-1971           MRN: [id number] Visit Date: 04/01/2023              Requested by: Madelin Headings, MD [address] [place],  [place] [address] PCP: Madelin Headings, MD   Assessment & Plan: Visit Diagnoses:  1. Unilateral primary osteoarthritis, left knee     Plan: Impression is recurrent left knee pain.  Symptoms seem like they could be coming from the underlying arthritis, however he is having occasional episodes of locking so we would like to go ahead and obtain an MRI to assess for recurrent medial meniscus tear.  He will follow-up with [place] once that has been completed.  Call with concerns or questions.  Follow-Up Instructions: Return for f/u after MRI.   Orders:  No orders of the defined types were placed in this encounter.  No orders of the defined types were placed in this encounter.     Procedures: No procedures performed   Clinical Data: No additional findings.   Subjective: Chief Complaint  Patient presents with   Left Knee - Pain    HPI patient is a pleasant 52 year old gentleman who comes in today with recurrent left knee pain.  No new injury.  History of left knee medial meniscus tear.  He underwent left knee arthroscopic debridement medial meniscus and chondroplasty back in December 2023.  It was noted during operative intervention he had grade 2 changes medial compartment.  He was seen by [place] in May with medial sided left knee pain.  Underwent cortisone injection which significantly helped for about 2 weeks and then with moderate relief for about 2 more months.  His symptoms have returned.  All this pain is the medial aspect.  This is constant with associated stiffness going from a seated to standing position and walking the first 40 feet or so.  He notes occasional locking.  Review of Systems as detailed in HPI.  All others reviewed and are negative.   Objective: Vital  Signs: Ht 5\' 9"  (1.753 m)   Wt 277 lb (125.6 kg)   BMI 40.91 kg/m   Physical Exam well-developed well-nourished gentleman in no acute distress.  Alert and oriented x 3.  Ortho Exam left knee exam: Medial joint line tenderness.  Range of motion 0 to 125 degrees.  No patellofemoral crepitus.  Ligaments are stable.  He is neurovascular intact distally.  Specialty Comments:  No specialty comments available.  Imaging: No new imaging   PMFS History: Patient Active Problem List   Diagnosis Date Noted   Acute medial meniscus tear of left knee 06/26/2022   Colon cancer screening    Polyp of descending colon    S/P right knee arthroscopy 04/15/2018   Lung nodule 02/11/2016   Diabetes mellitus without complication (HCC) 02/11/2016   BMI 35.0-35.9,adult 06/01/2012   Visit for preventive health examination 06/01/2012   Cough 07/09/2010   WEIGHT GAIN 09/10/2009   KIDNEY STONE 02/05/2009   Diabetes (HCC) 03/23/2007   HYPERLIPIDEMIA, MIXED 03/23/2007   OBESITY NOS 02/25/2007   Essential hypertension 02/25/2007   Hiatal hernia with GERD without esophagitis 02/11/2007   Past Medical History:  Diagnosis Date   Acute medial meniscus tear of right knee 01/05/2018   ANEMIA DUE TO CHRONIC BLOOD LOSS 09/10/2009   Diabetes mellitus    Elevated BP  GERD (gastroesophageal reflux disease)    HYPERLIPIDEMIA, MIXED 03/23/2007   Hypertension    KIDNEY STONE 02/05/2009   Left lower lobe pneumonia 02/08/2014   VENTRICULAR FUNCTION, DECREASED 06/21/2007    Family History  Problem Relation Age of Onset   Graves' disease Mother    Heart attack Father 78       COD   Heart failure Father    Kidney disease Father    Diabetes Brother    HIV Brother    Heart attack Maternal Grandfather    Heart attack Paternal Grandfather    Heart disease Brother 92       CABG    Past Surgical History:  Procedure Laterality Date   COLONOSCOPY WITH PROPOFOL N/A 10/02/2021   Procedure: COLONOSCOPY WITH  PROPOFOL;  Surgeon: Toney Reil, MD;  Location: [facility] ENDOSCOPY;  Service: Gastroenterology;  Laterality: N/A;   ESOPHAGOGASTRODUODENOSCOPY N/A 10/02/2021   Procedure: ESOPHAGOGASTRODUODENOSCOPY (EGD);  Surgeon: Toney Reil, MD;  Location: [facility] ENDOSCOPY;  Service: Gastroenterology;  Laterality: N/A;   KNEE ARTHROSCOPY WITH MEDIAL MENISECTOMY Left 07/09/2022   Procedure: LEFT KNEE ARTHROSCOPY, DEBRIDEMENT AND PARTIAL MEDIAL MENISCECTOMY;  Surgeon: Tarry Kos, MD;  Location: [facility];  Service: Orthopedics;  Laterality: Left;   Social History   Occupational History    Employer: SELF-EMPLOYED    Comment: Does not work outside the home  Tobacco Use   Smoking status: Former   Smokeless tobacco: Former    Quit date: 07/28/1992  Vaping Use   Vaping status: Never Used  Substance and Sexual Activity   Alcohol use: No   Drug use: No   Sexual activity: Not on file

## 2023-04-02 ENCOUNTER — Other Ambulatory Visit (HOSPITAL_BASED_OUTPATIENT_CLINIC_OR_DEPARTMENT_OTHER): Payer: Self-pay

## 2023-04-02 ENCOUNTER — Encounter: Payer: Self-pay | Admitting: Orthopaedic Surgery

## 2023-04-03 ENCOUNTER — Other Ambulatory Visit (HOSPITAL_BASED_OUTPATIENT_CLINIC_OR_DEPARTMENT_OTHER): Payer: Self-pay

## 2023-04-27 ENCOUNTER — Other Ambulatory Visit (HOSPITAL_BASED_OUTPATIENT_CLINIC_OR_DEPARTMENT_OTHER): Payer: Self-pay

## 2023-04-28 ENCOUNTER — Other Ambulatory Visit (HOSPITAL_BASED_OUTPATIENT_CLINIC_OR_DEPARTMENT_OTHER): Payer: Self-pay

## 2023-04-30 ENCOUNTER — Other Ambulatory Visit (HOSPITAL_BASED_OUTPATIENT_CLINIC_OR_DEPARTMENT_OTHER): Payer: Self-pay

## 2023-04-30 ENCOUNTER — Other Ambulatory Visit: Payer: Self-pay | Admitting: Internal Medicine

## 2023-04-30 ENCOUNTER — Other Ambulatory Visit: Payer: Self-pay

## 2023-04-30 MED ORDER — ATORVASTATIN CALCIUM 40 MG PO TABS
20.0000 mg | ORAL_TABLET | Freq: Every day | ORAL | 1 refills | Status: DC
  Filled 2023-04-30: qty 15, 30d supply, fill #0
  Filled 2023-05-28: qty 15, 30d supply, fill #1

## 2023-05-01 ENCOUNTER — Other Ambulatory Visit (HOSPITAL_BASED_OUTPATIENT_CLINIC_OR_DEPARTMENT_OTHER): Payer: Self-pay

## 2023-05-01 ENCOUNTER — Ambulatory Visit
Admission: RE | Admit: 2023-05-01 | Discharge: 2023-05-01 | Disposition: A | Discharge status: Home / Self Care | Payer: 59 | Source: Ambulatory Visit | Attending: Orthopaedic Surgery | Admitting: Orthopaedic Surgery

## 2023-05-01 DIAGNOSIS — M1712 Unilateral primary osteoarthritis, left knee: Secondary | ICD-10-CM

## 2023-05-19 ENCOUNTER — Encounter: Payer: Self-pay | Admitting: Orthopaedic Surgery

## 2023-05-19 ENCOUNTER — Ambulatory Visit (INDEPENDENT_AMBULATORY_CARE_PROVIDER_SITE_OTHER): Payer: 59 | Admitting: Orthopaedic Surgery

## 2023-05-19 DIAGNOSIS — Z6841 Body Mass Index (BMI) 40.0 and over, adult: Secondary | ICD-10-CM

## 2023-05-19 DIAGNOSIS — S83242A Other tear of medial meniscus, current injury, left knee, initial encounter: Secondary | ICD-10-CM | POA: Diagnosis not present

## 2023-05-19 NOTE — Progress Notes (Signed)
Office Visit Note   Patient: Alfred Johnson           Date of Birth: [birth year]           MRN: [id number] Visit Date: 05/19/2023              Requested by: Madelin Headings, MD [address] [place],  [place] [address] PCP: Madelin Headings, MD   Assessment & Plan: Visit Diagnoses:  1. Acute medial meniscus tear of left knee, initial encounter   2. Body mass index 40.0-44.9, adult (HCC)     Plan: MRI of the left knee shows a complex tear of the medial meniscus which corresponds to his symptoms.  Treatment options were reviewed and based on his options he is elected to move forward with arthroscopic partial medial meniscectomy.  Risk benefits prognosis reviewed.  Questions encouraged and answered.  The patient meets the AMA guidelines for Morbid (severe) obesity with a BMI > 40.0 and I have recommended weight loss.  Follow-Up Instructions: No follow-ups on file.   Orders:  No orders of the defined types were placed in this encounter.  No orders of the defined types were placed in this encounter.     Procedures: No procedures performed   Clinical Data: No additional findings.   Subjective: Chief Complaint  Patient presents with   Left Knee - Follow-up    MRI review    HPI Alfred Johnson is here to discuss left knee MRI scan. Review of Systems  Constitutional: Negative.   HENT: Negative.    Eyes: Negative.   Respiratory: Negative.    Cardiovascular: Negative.   Gastrointestinal: Negative.   Endocrine: Negative.   Genitourinary: Negative.   Skin: Negative.   Allergic/Immunologic: Negative.   Neurological: Negative.   Hematological: Negative.   Psychiatric/Behavioral: Negative.    All other systems reviewed and are negative.    Objective: Vital Signs: There were no vitals taken for this visit.  Physical Exam Vitals and nursing note reviewed.  Constitutional:      Appearance: He is well-developed.  Pulmonary:     Effort: Pulmonary effort is  normal.  Abdominal:     Palpations: Abdomen is soft.  Skin:    General: Skin is warm.  Neurological:     Mental Status: He is alert and oriented to person, place, and time.  Psychiatric:        Behavior: Behavior normal.        Thought Content: Thought content normal.        Judgment: Judgment normal.     Ortho Exam Exam of the left knee is unchanged. Specialty Comments:  No specialty comments available.  Imaging: No results found.   PMFS History: Patient Active Problem List   Diagnosis Date Noted   Acute medial meniscus tear of left knee 06/26/2022   Colon cancer screening    Polyp of descending colon    S/P right knee arthroscopy 04/15/2018   Lung nodule 02/11/2016   Diabetes mellitus without complication (HCC) 02/11/2016   BMI 35.0-35.9,adult 06/01/2012   Visit for preventive health examination 06/01/2012   Cough 07/09/2010   WEIGHT GAIN 09/10/2009   KIDNEY STONE 02/05/2009   Diabetes (HCC) 03/23/2007   HYPERLIPIDEMIA, MIXED 03/23/2007   OBESITY NOS 02/25/2007   Essential hypertension 02/25/2007   Hiatal hernia with GERD without esophagitis 02/11/2007   Past Medical History:  Diagnosis Date   Acute medial meniscus tear of right knee 01/05/2018   ANEMIA DUE TO CHRONIC  BLOOD LOSS 09/10/2009   Diabetes mellitus    Elevated BP    GERD (gastroesophageal reflux disease)    HYPERLIPIDEMIA, MIXED 03/23/2007   Hypertension    KIDNEY STONE 02/05/2009   Left lower lobe pneumonia 02/08/2014   VENTRICULAR FUNCTION, DECREASED 06/21/2007    Family History  Problem Relation Age of Onset   Graves' disease Mother    Heart attack Father 46       COD   Heart failure Father    Kidney disease Father    Diabetes Brother    HIV Brother    Heart attack Maternal Grandfather    Heart attack Paternal Grandfather    Heart disease Brother 77       CABG    Past Surgical History:  Procedure Laterality Date   COLONOSCOPY WITH PROPOFOL N/A 10/02/2021   Procedure: COLONOSCOPY  WITH PROPOFOL;  Surgeon: Toney Reil, MD;  Location: [facility] ENDOSCOPY;  Service: Gastroenterology;  Laterality: N/A;   ESOPHAGOGASTRODUODENOSCOPY N/A 10/02/2021   Procedure: ESOPHAGOGASTRODUODENOSCOPY (EGD);  Surgeon: Toney Reil, MD;  Location: [facility] ENDOSCOPY;  Service: Gastroenterology;  Laterality: N/A;   KNEE ARTHROSCOPY WITH MEDIAL MENISECTOMY Left 07/09/2022   Procedure: LEFT KNEE ARTHROSCOPY, DEBRIDEMENT AND PARTIAL MEDIAL MENISCECTOMY;  Surgeon: Tarry Kos, MD;  Location: [facility];  Service: Orthopedics;  Laterality: Left;   Social History   Occupational History    Employer: SELF-EMPLOYED    Comment: Does not work outside the home  Tobacco Use   Smoking status: Former   Smokeless tobacco: Former    Quit date: 07/28/1992  Vaping Use   Vaping status: Never Used  Substance and Sexual Activity   Alcohol use: No   Drug use: No   Sexual activity: Not on file

## 2023-05-20 ENCOUNTER — Other Ambulatory Visit (HOSPITAL_BASED_OUTPATIENT_CLINIC_OR_DEPARTMENT_OTHER): Payer: Self-pay

## 2023-05-20 MED ORDER — COMIRNATY 30 MCG/0.3ML IM SUSY
0.3000 mL | PREFILLED_SYRINGE | Freq: Once | INTRAMUSCULAR | 0 refills | Status: AC
  Filled 2023-05-20: qty 0.3, 1d supply, fill #0

## 2023-05-20 MED ORDER — INFLUENZA VIRUS VACC SPLIT PF (FLUZONE) 0.5 ML IM SUSY
0.5000 mL | PREFILLED_SYRINGE | Freq: Once | INTRAMUSCULAR | 0 refills | Status: AC
  Filled 2023-05-20: qty 0.5, 1d supply, fill #0

## 2023-05-21 ENCOUNTER — Other Ambulatory Visit (HOSPITAL_BASED_OUTPATIENT_CLINIC_OR_DEPARTMENT_OTHER): Payer: Self-pay

## 2023-05-21 MED ORDER — MUPIROCIN 2 % EX OINT
1.0000 | TOPICAL_OINTMENT | Freq: Two times a day (BID) | CUTANEOUS | 0 refills | Status: AC
  Filled 2023-05-21: qty 22, 11d supply, fill #0
  Filled 2023-05-27 – 2023-05-30 (×4): qty 22, 11d supply, fill #1

## 2023-05-25 ENCOUNTER — Encounter: Payer: Self-pay | Admitting: Orthopaedic Surgery

## 2023-05-25 LAB — HM DIABETES EYE EXAM

## 2023-05-26 ENCOUNTER — Other Ambulatory Visit: Payer: Self-pay | Admitting: Internal Medicine

## 2023-05-26 DIAGNOSIS — E119 Type 2 diabetes mellitus without complications: Secondary | ICD-10-CM

## 2023-05-27 ENCOUNTER — Other Ambulatory Visit (HOSPITAL_BASED_OUTPATIENT_CLINIC_OR_DEPARTMENT_OTHER): Payer: Self-pay

## 2023-05-27 ENCOUNTER — Ambulatory Visit (INDEPENDENT_AMBULATORY_CARE_PROVIDER_SITE_OTHER): Payer: 59 | Admitting: Internal Medicine

## 2023-05-27 ENCOUNTER — Encounter (HOSPITAL_BASED_OUTPATIENT_CLINIC_OR_DEPARTMENT_OTHER): Payer: Self-pay | Admitting: Internal Medicine

## 2023-05-27 VITALS — BP 118/88 | HR 106 | Ht 69.0 in | Wt 280.4 lb

## 2023-05-27 DIAGNOSIS — E785 Hyperlipidemia, unspecified: Secondary | ICD-10-CM

## 2023-05-27 DIAGNOSIS — R931 Abnormal findings on diagnostic imaging of heart and coronary circulation: Secondary | ICD-10-CM

## 2023-05-27 DIAGNOSIS — I10 Essential (primary) hypertension: Secondary | ICD-10-CM

## 2023-05-27 DIAGNOSIS — E7841 Elevated Lipoprotein(a): Secondary | ICD-10-CM | POA: Diagnosis not present

## 2023-05-27 NOTE — Patient Instructions (Signed)
Medication Instructions:  NO CHANGES  *If you need a refill on your cardiac medications before your next appointment, please call your pharmacy*   Lab Work: FASTING NMR lipoprofile and HS-CRP   If you have labs (blood work) drawn today and your tests are completely normal, you will receive your results only by: MyChart Message (if you have MyChart) OR A paper copy in the mail If you have any lab test that is abnormal or we need to change your treatment, we will call you to review the results.   Follow-Up: At [facility], you and your health needs are our priority.  As part of our continuing mission to provide you with exceptional heart care, we have created designated Provider Care Teams.  These Care Teams include your primary Cardiologist (physician) and Advanced Practice Providers (APPs -  Physician Assistants and Nurse Practitioners) who all work together to provide you with the care you need, when you need it.  We recommend signing up for the patient portal called "MyChart".  Sign up information is provided on this After Visit Summary.  MyChart is used to connect with patients for Virtual Visits (Telemedicine).  Patients are able to view lab/test results, encounter notes, upcoming appointments, etc.  Non-urgent messages can be sent to your provider as well.   To learn more about what you can do with MyChart, go to [email or web address].    Your next appointment:   4 months with Dr. Rennis Golden

## 2023-05-27 NOTE — Progress Notes (Signed)
OFFICE NOTE  Chief Complaint:  No complaints  Primary Care Physician: Madelin Headings, MD  HPI:  Alfred Johnson is a pleasant 52 year old male who is coming referred to me by Dr. Fabian Alfred Johnson for evaluation of coronary disease. His past history significant for diabetes followed by Dr. Everardo All in endocrinology. He does not have hypertension and is not on treatment for any dyslipidemia. His last lipid profile showed an LDL cholesterol 95 with total cholesterol less than 160. He does have a strong family history of heart disease with heart attacks in his father and both grandfathers. He also recently had a brother who had 3 vessel, bypass one month ago. Since that time he's been having some intermittent symptoms with vague, atypical sounding chest pain and occasional lightheadedness. He did have a pneumonia last summer. Fortunately, he is very active. He's been doing significant exercise several days a week including both aerobic and resistance training. He's managed to have no symptoms such as chest pain or worsening shortness of breath during these exercises.  Mr. Alfred Johnson returns today for follow-up. He underwent a CT coronary artery calcium score. This resulted back is 0. Unfortunately he was noted to have some small pulmonary nodules. He will need a repeat plain chest CT in one year. He also obtained a cholesterol profile which demonstrated an LDL-P of 1566, LDL-C of 117, HDL 45, triglycerides 64 and total cholesterol 175. Although he has no chronic calcium, the diabetic he should have risk factor modification. We talked about cholesterol medications and he did research with his insurance company as to what medications would be the most cost effective. He is interested in taking Crestor which I think is a good medication.  02/11/2016  Mr. Alfred Johnson was seen today in follow-up. Over the past year he's done fairly well. His blood sugars however have been elevated and recently he was started on Invokana  in addition to his other diabetes medicines. Blood pressure appears well controlled today. He denies any chest pain or worsening shortness of breath. Of note his PCP recheck his cholesterol November 2016 which showed a total cholesterol of 86, triglycerides 47, HDL 40 and LDL of 36. This represents significant cholesterol control.  02/17/2017  Mr. Alfred Johnson returns today for follow-up. Spent a year since I last saw him. As a recap he had an initial coronary artery calcium score which was 0 however subsequently he had a follow-up of a small pulmonary nodule which was found to be stable, but the radiologist indicated there was a small speck of calcium in the LAD. Either this is relevant however since he is diagnosed as diabetic. He is at high risk and therefore will need a strict cholesterol control with a goal LDL-C less than 70. He was initially placed on atorvastatin 40 mg daily however did have side effects including myalgias on that dose. I asked him to reduce the dose to 20 mg daily a says is tolerating this just fine. He reports taking the medicine regularly. Unfortunately has had some weight gain and says that his diet is "horrible". He says that he goes in phases as far as exercise and diet. I told him that we would really want to be aggressive, particularly looking at his lifetime risk, which he found to be funny. He said that no males in his family have lived to be greater than 22 years old. Despite that, he does want to live a longer, healthier life.  03/19/2018  Mr. Alfred Johnson is seen today in  follow-up.  Overall he is doing well without complaints.  Denies any chest pain or worsening shortness of breath.  He is due for repeat lipid profile.  He said his hemoglobin A1c is now down to 6.8.  Recently was started on low-dose bromocriptine which may show some additional benefit apparently diabetics.  Unfortunately not been able to lose a lot of weight.  Is been struggling with a meniscal tear in his knee  which is been repaired and will likely need to be repaired again.  12/09/2019  Mr. Alfred Johnson seen today in follow-up.  Overall he continues to do well.  He denies any chest pain or worsening shortness of breath.  Recently he has lost about 20 pounds.  He started to become more physically active.  Hemoglobin A1c in January was 8.4 this come down to 7.1.  His LDL was 68.  He needs a refill of his atorvastatin.  Blood pressures well controlled today 121/72.  EKG shows normal sinus rhythm.  05/06/2022  Mr. Alfred Johnson continues to do well.  He denies any chest pain or shortness of breath.  Weight has been fairly stable although he does exercise and has been swimming here at the [facility].  He is A1c is 7.2%.  Last LDL cholesterol less than 70.  As mentioned before he had 0 coronary calcium in 2016 but subsequently was noted to have some calcification in the LAD by a chest CT, noncontrast 1 year later.  He has been treated aggressively because of his diabetes however wonders as to whether or not he has developed more evidence of any coronary artery disease.  05/27/2023  Mr. Alfred Johnson is here today for follow-up.  Overall he says he is feeling fairly well although continues to struggle with bilateral knee pain and meniscal tears.  EKG today shows a sinus tachycardia.  Blood pressure is well-controlled.  Surprisingly he developed a significant increase in coronary calcification based on his last CT scan.  This was a calcium score last year which showed a calcium score of 383, 96 percentile for age and sex matched controls.  This is despite having had well-controlled dyslipidemia on statin therapy with an LDL less than 70.  I did go ahead and assess for an elevated LP(a) and his was noted to be mildly elevated 81.9 nmol/L.  Is not clear to me however that this is increasing his cardiovascular risk substantially.  There may be other factors such as ongoing inflammation.  Given the fact that he is  diabetic as well, I would likely target his LDL to less than 55 for more aggressive lipid-lowering.  He is due for repeat lipid testing.  PMHx:  Past Medical History:  Diagnosis Date   Acute medial meniscus tear of right knee 01/05/2018   ANEMIA DUE TO CHRONIC BLOOD LOSS 09/10/2009   Diabetes mellitus    Elevated BP    GERD (gastroesophageal reflux disease)    HYPERLIPIDEMIA, MIXED 03/23/2007   Hypertension    KIDNEY STONE 02/05/2009   Left lower lobe pneumonia 02/08/2014   VENTRICULAR FUNCTION, DECREASED 06/21/2007    Past Surgical History:  Procedure Laterality Date   COLONOSCOPY WITH PROPOFOL N/A 10/02/2021   Procedure: COLONOSCOPY WITH PROPOFOL;  Surgeon: Toney Reil, MD;  Location: [facility] ENDOSCOPY;  Service: Gastroenterology;  Laterality: N/A;   ESOPHAGOGASTRODUODENOSCOPY N/A 10/02/2021   Procedure: ESOPHAGOGASTRODUODENOSCOPY (EGD);  Surgeon: Toney Reil, MD;  Location: [facility] ENDOSCOPY;  Service: Gastroenterology;  Laterality: N/A;   KNEE ARTHROSCOPY WITH MEDIAL MENISECTOMY  Left 07/09/2022   Procedure: LEFT KNEE ARTHROSCOPY, DEBRIDEMENT AND PARTIAL MEDIAL MENISCECTOMY;  Surgeon: Tarry Kos, MD;  Location: [facility];  Service: Orthopedics;  Laterality: Left;    FAMHx:  Family History  Problem Relation Age of Onset   Graves' disease Mother    Heart attack Father 89       COD   Heart failure Father    Kidney disease Father    Diabetes Brother    HIV Brother    Heart attack Maternal Grandfather    Heart attack Paternal Grandfather    Heart disease Brother 79       CABG    SOCHx:   reports that he has quit smoking. He quit smokeless tobacco use about 30 years ago. He reports that he does not drink alcohol and does not use drugs.  ALLERGIES:  No Known Allergies  ROS: Pertinent items noted in HPI and remainder of comprehensive ROS otherwise negative.  HOME MEDS: Current Outpatient Medications  Medication Sig Dispense Refill   aspirin  81 MG tablet Take 81 mg by mouth daily.     atorvastatin (LIPITOR) 40 MG tablet Take 0.5 tablets (20 mg total) by mouth daily. 15 tablet 1   cetirizine (ZYRTEC) 10 MG tablet Take 10 mg by mouth daily.     colesevelam (WELCHOL) 625 MG tablet Take 2 tablets (1,250 mg total) by mouth daily. 180 tablet 3   Continuous Glucose Sensor (FREESTYLE LIBRE 3 SENSOR) MISC Apply 1 sensor every 14 days. 6 each 3   empagliflozin (JARDIANCE) 25 MG TABS tablet Take 1 tablet (25 mg total) by mouth daily. 90 tablet 3   metFORMIN (GLUCOPHAGE-XR) 500 MG 24 hr tablet Take 4 tablets (2,000 mg total) by mouth daily with breakfast. 360 tablet 1   mupirocin ointment (BACTROBAN) 2 % Apply to affected area of the right lower leg Twice a day for 21 days 44 g 0   pioglitazone (ACTOS) 45 MG tablet Take 1 tablet (45 mg total) by mouth daily. 90 tablet 3   tirzepatide (MOUNJARO) 10 MG/0.5ML Pen Inject 10 mg into the skin once a week. 6 mL 3   No current facility-administered medications for this visit.    LABS/IMAGING: No results found for this or any previous visit (from the past 48 hour(s)). No results found.  WEIGHTS: Wt Readings from Last 3 Encounters:  05/27/23 280 lb 6.4 oz (127.2 kg)  04/01/23 277 lb (125.6 kg)  03/23/23 277 lb 3.2 oz (125.7 kg)    VITALS: BP 118/88   Pulse (!) 106   Ht 5\' 9"  (1.753 m)   Wt 280 lb 6.4 oz (127.2 kg)   SpO2 96%   BMI 41.41 kg/m   EXAM: General appearance: alert, no distress and moderately obese Neck: no carotid bruit, no JVD and thyroid not enlarged, symmetric, no tenderness/mass/nodules Lungs: clear to auscultation bilaterally Heart: regular rate and rhythm, S1, S2 normal, no murmur, click, rub or gallop Abdomen: soft, non-tender; bowel sounds normal; no masses,  no organomegaly Extremities: extremities normal, atraumatic, no cyanosis or edema Pulses: 2+ and symmetric Skin: Skin color, texture, turgor normal. No rashes or lesions Neurologic: Grossly normal Psych:  Pleasant  EKG: EKG Interpretation Date/Time:  Wednesday May 27 2023 15:36:20 EDT Ventricular Rate:  106 PR Interval:  148 QRS Duration:  92 QT Interval:  354 QTC Calculation: 470 R Axis:   -14  Text Interpretation: Sinus tachycardia Possible Inferior infarct , age undetermined Compared to previous tracing the  rate is faster, inferior Q waves were previously noted Confirmed by Zoila Shutter [phone]) on 05/27/2023 3:47:19 PM    ASSESSMENT: Atypical chest pain - Zero CAC (10/2014), repeat CAC score of 383 (96%) in 2023 Strong family history of coronary disease Elevated Lp(a) at 81.9 nmol/L Type 2 diabetes Obesity Dyslipidemia, goal LDL less than 55 (very high risk). Solitary pulmonary nodule - benign  PLAN: 1.   Mr. Pont had a surprising jump in coronary calcium over the past 10 years from 0-383.  This is despite good control of LDL to less than 70.  On further evaluation his LP(a) is minimally elevated.  He is a diabetic and is considered very high risk with now a target of less than 55 for LDL.  I like to repeat his lipids and if he is still above this target on statin therapy would consider adding PCSK9 inhibitor.  This could also give him some benefit in lowering his LP(a).  Will also assess high-sensitivity CRP to see if that could be a potential target for treatment.  Follow-up with me in 4 months or sooner as necessary.  Chrystie Nose, MD, [facility], FACP  [place]  [facility]  Medical Director of the [facility]  [facility] Diplomate of the American Board of Clinical Lipidology Attending Cardiologist  Direct Dial: [phone]  Fax: [phone]  Website:  [email or web address] 05/27/2023, 3:47 PM

## 2023-05-28 ENCOUNTER — Other Ambulatory Visit (HOSPITAL_BASED_OUTPATIENT_CLINIC_OR_DEPARTMENT_OTHER): Payer: Self-pay

## 2023-05-28 ENCOUNTER — Other Ambulatory Visit: Payer: Self-pay

## 2023-05-28 MED ORDER — METFORMIN HCL ER 500 MG PO TB24
2000.0000 mg | ORAL_TABLET | Freq: Every day | ORAL | 1 refills | Status: DC
Start: 2023-05-28 — End: 2023-09-21
  Filled 2023-05-28: qty 120, 30d supply, fill #0
  Filled 2023-06-27 (×2): qty 120, 30d supply, fill #1
  Filled 2023-07-24: qty 120, 30d supply, fill #2
  Filled 2023-08-25: qty 120, 30d supply, fill #3

## 2023-05-30 ENCOUNTER — Other Ambulatory Visit (HOSPITAL_BASED_OUTPATIENT_CLINIC_OR_DEPARTMENT_OTHER): Payer: Self-pay

## 2023-05-31 LAB — NMR, LIPOPROFILE
Cholesterol, Total: 103 mg/dL (ref 100–199)
HDL Particle Number: 28.6 umol/L — ABNORMAL LOW (ref >=30.5)
HDL-C: 36 mg/dL — ABNORMAL LOW (ref >39)
LDL Particle Number: 807 nmol/L (ref <1000)
LDL Size: 20.2 nm — ABNORMAL LOW (ref >20.5)
LDL-C (NIH Calc): 52 mg/dL (ref 0–99)
LP-IR Score: 52 — ABNORMAL HIGH (ref <=45)
Small LDL Particle Number: 470 nmol/L (ref <=527)
Triglycerides: 67 mg/dL (ref 0–149)

## 2023-05-31 LAB — HIGH SENSITIVITY CRP: CRP, High Sensitivity: 0.54 mg/L (ref 0.00–3.00)

## 2023-06-03 ENCOUNTER — Encounter (HOSPITAL_BASED_OUTPATIENT_CLINIC_OR_DEPARTMENT_OTHER): Payer: Self-pay | Admitting: Internal Medicine

## 2023-06-05 ENCOUNTER — Other Ambulatory Visit: Payer: Self-pay | Admitting: Medical Genetics

## 2023-06-05 DIAGNOSIS — Z006 Encounter for examination for normal comparison and control in clinical research program: Secondary | ICD-10-CM

## 2023-06-08 ENCOUNTER — Encounter: Payer: Self-pay | Admitting: Orthopaedic Surgery

## 2023-06-09 ENCOUNTER — Other Ambulatory Visit (HOSPITAL_COMMUNITY)
Admission: RE | Admit: 2023-06-09 | Discharge: 2023-06-09 | Disposition: A | Discharge status: Home / Self Care | Payer: 59 | Source: Ambulatory Visit | Attending: Medical Genetics | Admitting: Medical Genetics

## 2023-06-09 DIAGNOSIS — Z006 Encounter for examination for normal comparison and control in clinical research program: Secondary | ICD-10-CM | POA: Insufficient documentation

## 2023-06-16 ENCOUNTER — Other Ambulatory Visit: Payer: Self-pay | Admitting: Physician Assistant

## 2023-06-16 ENCOUNTER — Other Ambulatory Visit (HOSPITAL_BASED_OUTPATIENT_CLINIC_OR_DEPARTMENT_OTHER): Payer: Self-pay

## 2023-06-16 MED ORDER — ONDANSETRON HCL 4 MG PO TABS
4.0000 mg | ORAL_TABLET | Freq: Three times a day (TID) | ORAL | 0 refills | Status: DC | PRN
  Filled 2023-06-16: qty 40, 13d supply, fill #0

## 2023-06-16 MED ORDER — HYDROCODONE-ACETAMINOPHEN 5-325 MG PO TABS
1.0000 | ORAL_TABLET | Freq: Three times a day (TID) | ORAL | 0 refills | Status: DC | PRN
  Filled 2023-06-16: qty 20, 7d supply, fill #0

## 2023-06-18 ENCOUNTER — Encounter: Payer: Self-pay | Admitting: Orthopaedic Surgery

## 2023-06-18 DIAGNOSIS — S83232A Complex tear of medial meniscus, current injury, left knee, initial encounter: Secondary | ICD-10-CM | POA: Diagnosis not present

## 2023-06-20 ENCOUNTER — Other Ambulatory Visit (HOSPITAL_BASED_OUTPATIENT_CLINIC_OR_DEPARTMENT_OTHER): Payer: Self-pay

## 2023-06-20 LAB — HELIX MOLECULAR SCREEN: Genetic Analysis Overall Interpretation: NEGATIVE

## 2023-06-20 LAB — GENECONNECT MOLECULAR SCREEN

## 2023-06-22 ENCOUNTER — Other Ambulatory Visit (HOSPITAL_BASED_OUTPATIENT_CLINIC_OR_DEPARTMENT_OTHER): Payer: Self-pay

## 2023-06-24 ENCOUNTER — Other Ambulatory Visit (HOSPITAL_BASED_OUTPATIENT_CLINIC_OR_DEPARTMENT_OTHER): Payer: Self-pay

## 2023-06-24 ENCOUNTER — Encounter: Payer: 59 | Admitting: Orthopaedic Surgery

## 2023-06-25 ENCOUNTER — Other Ambulatory Visit: Payer: Self-pay | Admitting: Internal Medicine

## 2023-06-27 ENCOUNTER — Other Ambulatory Visit (HOSPITAL_BASED_OUTPATIENT_CLINIC_OR_DEPARTMENT_OTHER): Payer: Self-pay

## 2023-06-29 ENCOUNTER — Other Ambulatory Visit (HOSPITAL_COMMUNITY): Payer: Self-pay

## 2023-06-29 ENCOUNTER — Other Ambulatory Visit (HOSPITAL_BASED_OUTPATIENT_CLINIC_OR_DEPARTMENT_OTHER): Payer: Self-pay

## 2023-06-29 MED ORDER — ATORVASTATIN CALCIUM 40 MG PO TABS
20.0000 mg | ORAL_TABLET | Freq: Every day | ORAL | 3 refills | Status: DC
  Filled 2023-06-29: qty 15, 30d supply, fill #0
  Filled 2023-07-26: qty 15, 30d supply, fill #1
  Filled 2023-08-25: qty 15, 30d supply, fill #2
  Filled 2023-09-23: qty 45, 90d supply, fill #3
  Filled 2023-12-19 (×2): qty 45, 90d supply, fill #4
  Filled 2024-03-17: qty 45, 90d supply, fill #5

## 2023-06-30 ENCOUNTER — Ambulatory Visit (INDEPENDENT_AMBULATORY_CARE_PROVIDER_SITE_OTHER): Payer: 59 | Admitting: Orthopaedic Surgery

## 2023-06-30 DIAGNOSIS — S83242A Other tear of medial meniscus, current injury, left knee, initial encounter: Secondary | ICD-10-CM

## 2023-06-30 NOTE — Progress Notes (Signed)
Post-Op Visit Note   Patient: Alfred Johnson           Date of Birth: 03-03-1971           MRN: [id number] Visit Date: 06/30/2023 PCP: Madelin Headings, MD   Assessment & Plan:  Chief Complaint:  Chief Complaint  Patient presents with   Left Knee - Routine Post Op   Visit Diagnoses:  1. Acute medial meniscus tear of left knee, initial encounter     Plan: Travontae is 1 weeks postop status post left knee scope for medial meniscus tear.  Overall doing okay.  Reports start up stiffness.  Exam of the left knee shows healed incisions.  Range of motion is progressing nicely.  No signs of infection.  Sutures removed and Steri-Strips applied.  Home exercises provided.  Increase activity as tolerated.  Recheck in 4 weeks.  Follow-Up Instructions: Return in about 4 weeks (around 07/28/2023) for with lindsey.   Orders:  No orders of the defined types were placed in this encounter.  No orders of the defined types were placed in this encounter.   Imaging: No results found.  PMFS History: Patient Active Problem List   Diagnosis Date Noted   Acute medial meniscus tear of left knee 06/26/2022   Colon cancer screening    Polyp of descending colon    S/P right knee arthroscopy 04/15/2018   Lung nodule 02/11/2016   Diabetes mellitus without complication (HCC) 02/11/2016   BMI 35.0-35.9,adult 06/01/2012   Visit for preventive health examination 06/01/2012   Cough 07/09/2010   WEIGHT GAIN 09/10/2009   KIDNEY STONE 02/05/2009   Diabetes (HCC) 03/23/2007   HYPERLIPIDEMIA, MIXED 03/23/2007   OBESITY NOS 02/25/2007   Essential hypertension 02/25/2007   Hiatal hernia with GERD without esophagitis 02/11/2007   Past Medical History:  Diagnosis Date   Acute medial meniscus tear of right knee 01/05/2018   ANEMIA DUE TO CHRONIC BLOOD LOSS 09/10/2009   Diabetes mellitus    Elevated BP    GERD (gastroesophageal reflux disease)    HYPERLIPIDEMIA, MIXED 03/23/2007   Hypertension     KIDNEY STONE 02/05/2009   Left lower lobe pneumonia 02/08/2014   VENTRICULAR FUNCTION, DECREASED 06/21/2007    Family History  Problem Relation Age of Onset   Graves' disease Mother    Heart attack Father 9       COD   Heart failure Father    Kidney disease Father    Diabetes Brother    HIV Brother    Heart attack Maternal Grandfather    Heart attack Paternal Grandfather    Heart disease Brother 81       CABG    Past Surgical History:  Procedure Laterality Date   COLONOSCOPY WITH PROPOFOL N/A 10/02/2021   Procedure: COLONOSCOPY WITH PROPOFOL;  Surgeon: Toney Reil, MD;  Location: [facility] ENDOSCOPY;  Service: Gastroenterology;  Laterality: N/A;   ESOPHAGOGASTRODUODENOSCOPY N/A 10/02/2021   Procedure: ESOPHAGOGASTRODUODENOSCOPY (EGD);  Surgeon: Toney Reil, MD;  Location: [facility] ENDOSCOPY;  Service: Gastroenterology;  Laterality: N/A;   KNEE ARTHROSCOPY WITH MEDIAL MENISECTOMY Left 07/09/2022   Procedure: LEFT KNEE ARTHROSCOPY, DEBRIDEMENT AND PARTIAL MEDIAL MENISCECTOMY;  Surgeon: Tarry Kos, MD;  Location: [facility];  Service: Orthopedics;  Laterality: Left;   Social History   Occupational History    Employer: SELF-EMPLOYED    Comment: Does not work outside the home  Tobacco Use   Smoking status: Former   Smokeless tobacco: Former  Quit date: 07/28/1992  Vaping Use   Vaping status: Never Used  Substance and Sexual Activity   Alcohol use: No   Drug use: No   Sexual activity: Not on file

## 2023-07-14 NOTE — Progress Notes (Unsigned)
No chief complaint on file.   HPI: Patient  Alfred Johnson  52 y.o. comes in today for Preventive Health Care visit   Underlyin gmetabolic followed and managed but endocrinology  Dm ht  hld Dr Elvera Lennox and Dr Rennis Golden   Health Maintenance  Topic Date Due   HIV Screening  Never done   FOOT EXAM  02/04/2023   OPHTHALMOLOGY EXAM  05/22/2023   Diabetic kidney evaluation - Urine ACR  06/05/2023   Diabetic kidney evaluation - eGFR measurement  07/10/2023   COVID-19 Vaccine (5 - 2024-25 season) 07/15/2023   HEMOGLOBIN A1C  09/23/2023   DTaP/Tdap/Td (3 - Td or Tdap) 06/07/2025   Colonoscopy  10/03/2026   INFLUENZA VACCINE  Completed   Hepatitis C Screening  Completed   Zoster Vaccines- Shingrix  Completed   HPV VACCINES  Aged Out   Health Maintenance Review LIFESTYLE:  Exercise:   Tobacco/ETS: Alcohol:  Sugar beverages: Sleep: Drug use: no HH of  Work:    ROS:  GEN/ HEENT: No fever, significant weight changes sweats headaches vision problems hearing changes, CV/ PULM; No chest pain shortness of breath cough, syncope,edema  change in exercise tolerance. GI /GU: No adominal pain, vomiting, change in bowel habits. No blood in the stool. No significant GU symptoms. SKIN/HEME: ,no acute skin rashes suspicious lesions or bleeding. No lymphadenopathy, nodules, masses.  NEURO/ PSYCH:  No neurologic signs such as weakness numbness. No depression anxiety. IMM/ Allergy: No unusual infections.  Allergy .   REST of 12 system review negative except as per HPI   Past Medical History:  Diagnosis Date   Acute medial meniscus tear of right knee 01/05/2018   ANEMIA DUE TO CHRONIC BLOOD LOSS 09/10/2009   Diabetes mellitus    Elevated BP    GERD (gastroesophageal reflux disease)    HYPERLIPIDEMIA, MIXED 03/23/2007   Hypertension    KIDNEY STONE 02/05/2009   Left lower lobe pneumonia 02/08/2014   VENTRICULAR FUNCTION, DECREASED 06/21/2007    Past Surgical History:  Procedure  Laterality Date   COLONOSCOPY WITH PROPOFOL N/A 10/02/2021   Procedure: COLONOSCOPY WITH PROPOFOL;  Surgeon: Toney Reil, MD;  Location: [facility] ENDOSCOPY;  Service: Gastroenterology;  Laterality: N/A;   ESOPHAGOGASTRODUODENOSCOPY N/A 10/02/2021   Procedure: ESOPHAGOGASTRODUODENOSCOPY (EGD);  Surgeon: Toney Reil, MD;  Location[facility] ENDOSCOPY;  Service: Gastroenterology;  Laterality: N/A;   KNEE ARTHROSCOPY WITH MEDIAL MENISECTOMY Left 07/09/2022   Procedure: LEFT KNEE ARTHROSCOPY, DEBRIDEMENT AND PARTIAL MEDIAL MENISCECTOMY;  Surgeon: Tarry Kos, MD;  Location: [facility];  Service: Orthopedics;  Laterality: Left;    Family History  Problem Relation Age of Onset   Graves' disease Mother    Heart attack Father 7       COD   Heart failure Father    Kidney disease Father    Diabetes Brother    HIV Brother    Heart attack Maternal Grandfather    Heart attack Paternal Grandfather    Heart disease Brother 83       CABG    Social History   Socioeconomic History   Marital status: Married    Spouse name: Not on file   Number of children: Not on file   Years of education: Not on file   Highest education level: Not on file  Occupational History    Employer: SELF-EMPLOYED    Comment: Does not work outside the home  Tobacco Use   Smoking status: Former   Smokeless tobacco: Former  Quit date: 07/28/1992  Vaping Use   Vaping status: Never Used  Substance and Sexual Activity   Alcohol use: No   Drug use: No   Sexual activity: Not on file  Other Topics Concern   Not on file  Social History Narrative   HH of 3   No pets   Firearms locked away   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    Social Drivers of Corporate investment banker Strain: Not on file  Food Insecurity: Not on file  Transportation Needs: Not on file  Physical Activity: Not on file  Stress: Not on file   Social Connections: Not on file    Outpatient Medications Prior to Visit  Medication Sig Dispense Refill   aspirin 81 MG tablet Take 81 mg by mouth daily.     atorvastatin (LIPITOR) 40 MG tablet Take 0.5 tablets (20 mg total) by mouth daily. 45 tablet 3   cetirizine (ZYRTEC) 10 MG tablet Take 10 mg by mouth daily.     colesevelam (WELCHOL) 625 MG tablet Take 2 tablets (1,250 mg total) by mouth daily. 180 tablet 3   Continuous Glucose Sensor (FREESTYLE LIBRE 3 SENSOR) MISC Apply 1 sensor every 14 days. 6 each 3   empagliflozin (JARDIANCE) 25 MG TABS tablet Take 1 tablet (25 mg total) by mouth daily. 90 tablet 3   HYDROcodone-acetaminophen (NORCO) 5-325 MG tablet Take 1 tablet by mouth 3 (three) times daily as needed. To be taken after surgery 20 tablet 0   metFORMIN (GLUCOPHAGE-XR) 500 MG 24 hr tablet Take 4 tablets (2,000 mg total) by mouth daily with breakfast. 360 tablet 1   ondansetron (ZOFRAN) 4 MG tablet Take 1 tablet (4 mg total) by mouth every 8 (eight) hours as needed for nausea or vomiting. 40 tablet 0   pioglitazone (ACTOS) 45 MG tablet Take 1 tablet (45 mg total) by mouth daily. 90 tablet 3   tirzepatide (MOUNJARO) 10 MG/0.5ML Pen Inject 10 mg into the skin once a week. 6 mL 3   No facility-administered medications prior to visit.     EXAM:  There were no vitals taken for this visit.  There is no height or weight on file to calculate BMI. Wt Readings from Last 3 Encounters:  05/27/23 280 lb 6.4 oz (127.2 kg)  04/01/23 277 lb (125.6 kg)  03/23/23 277 lb 3.2 oz (125.7 kg)   BP Readings from Last 3 Encounters:  05/27/23 118/88  03/23/23 (!) 130/90  11/18/22 (!) 141/92     Physical Exam: Vital signs reviewed UXL:KGMW is a well-developed well-nourished alert cooperative    who appearsr stated age in no acute distress.  HEENT: normocephalic atraumatic , Eyes: PERRL EOM's full, conjunctiva clear, Nares: paten,t no deformity discharge or tenderness., Ears: no deformity  EAC's clear TMs with normal landmarks. Mouth: clear OP, no lesions, edema.  Moist mucous membranes. Dentition in adequate repair. NECK: supple without masses, thyromegaly or bruits. CHEST/PULM:  Clear to auscultation and percussion breath sounds equal no wheeze , rales or rhonchi. No chest wall deformities or tenderness. Breast: normal by inspection . No dimpling, discharge, masses, tenderness or discharge . CV: PMI is nondisplaced, S1 S2 no gallops, murmurs, rubs. Peripheral pulses are full without delay.No JVD .  ABDOMEN: Bowel sounds normal nontender  No guard or rebound, no hepato splenomegal no CVA tenderness.   Extremtities:  No clubbing cyanosis or edema, no acute joint swelling or redness no focal atrophy NEURO:  Oriented x3, cranial nerves 3-12 appear to be intact, no obvious focal weakness,gait within normal limits no abnormal reflexes or asymmetrical SKIN: No acute rashes normal turgor, color, no bruising or petechiae. PSYCH: Oriented, good eye contact, no obvious depression anxiety, cognition and judgment appear normal. LN: no cervical axillary inguinal adenopathy  Lab Results  Component Value Date   WBC 8.6 06/04/2022   HGB 15.8 06/04/2022   HCT 48.3 06/04/2022   PLT 246.0 06/04/2022   GLUCOSE 159 (H) 07/09/2022   CHOL 116 06/04/2022   TRIG 58.0 06/04/2022   HDL 42.50 06/04/2022   LDLDIRECT 114.5 02/26/2007   LDLCALC 62 06/04/2022   ALT 19 06/04/2022   AST 15 06/04/2022   NA 138 07/09/2022   K 4.0 07/09/2022   CL 107 07/09/2022   CREATININE 0.81 07/09/2022   BUN 13 07/09/2022   CO2 21 (L) 07/09/2022   TSH 1.29 06/04/2022   PSA 0.46 06/04/2022   HGBA1C 7.4 (A) 03/23/2023   MICROALBUR 0.8 06/04/2022    BP Readings from Last 3 Encounters:  05/27/23 118/88  03/23/23 (!) 130/90  11/18/22 (!) 141/92    Lab plan reviewed with patient   ASSESSMENT AND PLAN:  Discussed the following assessment and plan:    ICD-10-CM   1. Visit for preventive health examination   Z00.00      No follow-ups on file.  Patient Care Team: Ayan Yankey, Neta Mends, MD as PCP - General Hilty, Lisette Abu, MD as PCP - Cardiology (Cardiology) Nelson Chimes, MD (Ophthalmology) Sherrie George, MD as Consulting Physician (Ophthalmology) Rennis Golden Lisette Abu, MD as Consulting Physician (Cardiology) Suzi Roots as Physician Assistant (Dermatology) Carlus Pavlov, MD as Consulting Physician (Internal Medicine) There are no Patient Instructions on file for this visit.  Neta Mends. Dehlia Kilner M.D.

## 2023-07-15 ENCOUNTER — Ambulatory Visit: Payer: 59 | Admitting: Internal Medicine

## 2023-07-15 ENCOUNTER — Encounter: Payer: Self-pay | Admitting: Internal Medicine

## 2023-07-15 VITALS — BP 120/80 | HR 109 | Temp 97.7°F | Ht 68.0 in | Wt 275.0 lb

## 2023-07-15 DIAGNOSIS — I10 Essential (primary) hypertension: Secondary | ICD-10-CM

## 2023-07-15 DIAGNOSIS — E1165 Type 2 diabetes mellitus with hyperglycemia: Secondary | ICD-10-CM | POA: Diagnosis not present

## 2023-07-15 DIAGNOSIS — Z0000 Encounter for general adult medical examination without abnormal findings: Secondary | ICD-10-CM

## 2023-07-15 DIAGNOSIS — Z7985 Long-term (current) use of injectable non-insulin antidiabetic drugs: Secondary | ICD-10-CM

## 2023-07-15 DIAGNOSIS — Z125 Encounter for screening for malignant neoplasm of prostate: Secondary | ICD-10-CM

## 2023-07-15 DIAGNOSIS — E782 Mixed hyperlipidemia: Secondary | ICD-10-CM | POA: Diagnosis not present

## 2023-07-15 DIAGNOSIS — Z114 Encounter for screening for human immunodeficiency virus [HIV]: Secondary | ICD-10-CM

## 2023-07-15 NOTE — Patient Instructions (Addendum)
Good to see you today  Future orders have been placed . Can make lab appt .   Continue lifestyle intervention healthy eating and activity as tolerated .

## 2023-07-16 ENCOUNTER — Other Ambulatory Visit (INDEPENDENT_AMBULATORY_CARE_PROVIDER_SITE_OTHER): Payer: 59

## 2023-07-16 DIAGNOSIS — E782 Mixed hyperlipidemia: Secondary | ICD-10-CM

## 2023-07-16 DIAGNOSIS — Z0000 Encounter for general adult medical examination without abnormal findings: Secondary | ICD-10-CM | POA: Diagnosis not present

## 2023-07-16 DIAGNOSIS — I10 Essential (primary) hypertension: Secondary | ICD-10-CM | POA: Diagnosis not present

## 2023-07-16 DIAGNOSIS — E1165 Type 2 diabetes mellitus with hyperglycemia: Secondary | ICD-10-CM

## 2023-07-16 DIAGNOSIS — Z114 Encounter for screening for human immunodeficiency virus [HIV]: Secondary | ICD-10-CM

## 2023-07-16 DIAGNOSIS — Z125 Encounter for screening for malignant neoplasm of prostate: Secondary | ICD-10-CM

## 2023-07-16 LAB — COMPREHENSIVE METABOLIC PANEL
ALT: 32 U/L (ref 0–53)
AST: 22 U/L (ref 0–37)
Albumin: 4.7 g/dL (ref 3.5–5.2)
Alkaline Phosphatase: 73 U/L (ref 39–117)
BUN: 18 mg/dL (ref 6–23)
CO2: 23 meq/L (ref 19–32)
Calcium: 9.7 mg/dL (ref 8.4–10.5)
Chloride: 101 meq/L (ref 96–112)
Creatinine, Ser: 0.91 mg/dL (ref 0.40–1.50)
GFR: 95.7 mL/min (ref >60.00)
Glucose, Bld: 190 mg/dL — ABNORMAL HIGH (ref 70–99)
Potassium: 4.6 meq/L (ref 3.5–5.1)
Sodium: 138 meq/L (ref 135–145)
Total Bilirubin: 0.6 mg/dL (ref 0.2–1.2)
Total Protein: 7.2 g/dL (ref 6.0–8.3)

## 2023-07-16 LAB — HEMOGLOBIN A1C: Hgb A1c MFr Bld: 8.7 % — ABNORMAL HIGH (ref 4.6–6.5)

## 2023-07-16 LAB — CBC WITH DIFFERENTIAL/PLATELET
Basophils Absolute: 0.1 10*3/uL (ref 0.0–0.1)
Basophils Relative: 0.8 % (ref 0.0–3.0)
Eosinophils Absolute: 0.2 10*3/uL (ref 0.0–0.7)
Eosinophils Relative: 2.8 % (ref 0.0–5.0)
HCT: 50 % (ref 39.0–52.0)
Hemoglobin: 16.4 g/dL (ref 13.0–17.0)
Lymphocytes Relative: 23.4 % (ref 12.0–46.0)
Lymphs Abs: 2.1 10*3/uL (ref 0.7–4.0)
MCHC: 32.9 g/dL (ref 30.0–36.0)
MCV: 88.8 fL (ref 78.0–100.0)
Monocytes Absolute: 1.4 10*3/uL — ABNORMAL HIGH (ref 0.1–1.0)
Monocytes Relative: 15.1 % — ABNORMAL HIGH (ref 3.0–12.0)
Neutro Abs: 5.2 10*3/uL (ref 1.4–7.7)
Neutrophils Relative %: 57.9 % (ref 43.0–77.0)
Platelets: 270 10*3/uL (ref 150.0–400.0)
RBC: 5.63 Mil/uL (ref 4.22–5.81)
RDW: 14.7 % (ref 11.5–15.5)
WBC: 8.9 10*3/uL (ref 4.0–10.5)

## 2023-07-16 LAB — LIPID PANEL
Cholesterol: 92 mg/dL (ref 0–200)
HDL: 33.3 mg/dL — ABNORMAL LOW (ref >39.00)
LDL Cholesterol: 43 mg/dL (ref 0–99)
NonHDL: 58.54
Total CHOL/HDL Ratio: 3
Triglycerides: 77 mg/dL (ref 0.0–149.0)
VLDL: 15.4 mg/dL (ref 0.0–40.0)

## 2023-07-16 LAB — MICROALBUMIN / CREATININE URINE RATIO
Creatinine,U: 74.6 mg/dL
Microalb Creat Ratio: 2.9 mg/g (ref 0.0–30.0)
Microalb, Ur: 2.1 mg/dL — ABNORMAL HIGH (ref 0.0–1.9)

## 2023-07-16 LAB — PSA: PSA: 0.62 ng/mL (ref 0.10–4.00)

## 2023-07-16 LAB — TSH: TSH: 2.3 u[IU]/mL (ref 0.35–5.50)

## 2023-07-16 NOTE — Progress Notes (Signed)
Result show  increase A1c upt to 8.7 from 7.4 not in best control ... rest as expected or at goal . Forwarding to your specialty team for advice and fu plans.

## 2023-07-17 LAB — HIV ANTIBODY (ROUTINE TESTING W REFLEX): HIV 1&2 Ab, 4th Generation: NONREACTIVE

## 2023-07-18 ENCOUNTER — Other Ambulatory Visit (HOSPITAL_BASED_OUTPATIENT_CLINIC_OR_DEPARTMENT_OTHER): Payer: Self-pay

## 2023-07-20 ENCOUNTER — Other Ambulatory Visit (HOSPITAL_BASED_OUTPATIENT_CLINIC_OR_DEPARTMENT_OTHER): Payer: Self-pay

## 2023-07-27 ENCOUNTER — Other Ambulatory Visit (HOSPITAL_BASED_OUTPATIENT_CLINIC_OR_DEPARTMENT_OTHER): Payer: Self-pay

## 2023-07-30 ENCOUNTER — Other Ambulatory Visit (HOSPITAL_BASED_OUTPATIENT_CLINIC_OR_DEPARTMENT_OTHER): Payer: Self-pay

## 2023-08-03 ENCOUNTER — Other Ambulatory Visit (HOSPITAL_BASED_OUTPATIENT_CLINIC_OR_DEPARTMENT_OTHER): Payer: Self-pay

## 2023-08-05 ENCOUNTER — Ambulatory Visit: Payer: Managed Care, Other (non HMO) | Admitting: Physician Assistant

## 2023-08-05 ENCOUNTER — Encounter: Payer: Self-pay | Admitting: Orthopaedic Surgery

## 2023-08-05 DIAGNOSIS — Z9889 Other specified postprocedural states: Secondary | ICD-10-CM

## 2023-08-05 NOTE — Progress Notes (Signed)
 Post-Op Visit Note   Patient: Alfred Johnson           Date of Birth: [birth year]           MRN: [id number] Visit Date: 08/05/2023 PCP: Charlett Apolinar POUR, MD   Assessment & Plan:  Chief Complaint:  Chief Complaint  Patient presents with   Left Knee - Follow-up    Left knee scope 06/18/2023   Visit Diagnoses:  1. S/P left knee arthroscopy     Plan: Patient is a pleasant 53 year old gentleman who comes in today approximately 7 weeks status post left knee arthroscopic debridement medial meniscus and chondroplasty 06/18/2023.  He has been doing relatively well.  He notes some stiffness as well as some discomfort with walking but this has significantly improved.  Examination of the left knee shows a very small effusion.  Range of motion 0 to 125 degrees.  He is neurovascularly intact distally.  This point, he will continue to advance with activity as tolerated.  He will take NSAIDs as needed.  If his symptoms worsen he may come back in for cortisone injection.  Otherwise, follow-up as needed.  Follow-Up Instructions: Return if symptoms worsen or fail to improve.   Orders:  No orders of the defined types were placed in this encounter.  No orders of the defined types were placed in this encounter.   Imaging: No new imaging  PMFS History: Patient Active Problem List   Diagnosis Date Noted   Acute medial meniscus tear of left knee 06/26/2022   Colon cancer screening    Polyp of descending colon    S/P right knee arthroscopy 04/15/2018   Lung nodule 02/11/2016   Type 2 diabetes mellitus with obesity (HCC) 02/11/2016   BMI 35.0-35.9,adult 06/01/2012   Visit for preventive health examination 06/01/2012   WEIGHT GAIN 09/10/2009   KIDNEY STONE 02/05/2009   Diabetes (HCC) 03/23/2007   HYPERLIPIDEMIA, MIXED 03/23/2007   OBESITY NOS 02/25/2007   Essential hypertension 02/25/2007   Hiatal hernia with GERD without esophagitis 02/11/2007   Past Medical History:  Diagnosis Date    Acute medial meniscus tear of right knee 01/05/2018   ANEMIA DUE TO CHRONIC BLOOD LOSS 09/10/2009   Cough 07/09/2010   Qualifier: Diagnosis of   By: Kassie MD, Sean A  HAD CHANGED FROM ACE      Feels like could be from  Cozaar also but not sure has allergy type sx last c xray 2011.     Diabetes mellitus    Elevated BP    GERD (gastroesophageal reflux disease)    HYPERLIPIDEMIA, MIXED 03/23/2007   Hypertension    KIDNEY STONE 02/05/2009   Left lower lobe pneumonia 02/08/2014   VENTRICULAR FUNCTION, DECREASED 06/21/2007    Family History  Problem Relation Age of Onset   Graves' disease Mother    Heart attack Father 36       COD   Heart failure Father    Kidney disease Father    Diabetes Brother    HIV Brother    Heart attack Maternal Grandfather    Heart attack Paternal Grandfather    Heart disease Brother 30       CABG    Past Surgical History:  Procedure Laterality Date   COLONOSCOPY WITH PROPOFOL  N/A 10/02/2021   Procedure: COLONOSCOPY WITH PROPOFOL ;  Surgeon: Unk Corinn Skiff, MD;  Location: [facility];  Service: Gastroenterology;  Laterality: N/A;   ESOPHAGOGASTRODUODENOSCOPY N/A 10/02/2021   Procedure: ESOPHAGOGASTRODUODENOSCOPY (EGD);  Surgeon: Unk,  Corinn Skiff, MD;  Location: [facility];  Service: Gastroenterology;  Laterality: N/A;   KNEE ARTHROSCOPY WITH MEDIAL MENISECTOMY Left 07/09/2022   Procedure: LEFT KNEE ARTHROSCOPY, DEBRIDEMENT AND PARTIAL MEDIAL MENISCECTOMY;  Surgeon: Jerri Kay HERO, MD;  Location: [facility];  Service: Orthopedics;  Laterality: Left;   Social History   Occupational History    Employer: SELF-EMPLOYED    Comment: Does not work outside the home  Tobacco Use   Smoking status: Former   Smokeless tobacco: Former    Quit date: 07/28/1992  Vaping Use   Vaping status: Never Used  Substance and Sexual Activity   Alcohol use: No   Drug use: No   Sexual activity: Not on file

## 2023-08-06 ENCOUNTER — Other Ambulatory Visit (HOSPITAL_BASED_OUTPATIENT_CLINIC_OR_DEPARTMENT_OTHER): Payer: Self-pay

## 2023-08-06 ENCOUNTER — Ambulatory Visit (INDEPENDENT_AMBULATORY_CARE_PROVIDER_SITE_OTHER): Payer: Managed Care, Other (non HMO) | Admitting: Internal Medicine

## 2023-08-06 ENCOUNTER — Other Ambulatory Visit: Payer: Self-pay

## 2023-08-06 ENCOUNTER — Encounter: Payer: Self-pay | Admitting: Internal Medicine

## 2023-08-06 VITALS — BP 122/78 | HR 97 | Ht 68.0 in | Wt 281.2 lb

## 2023-08-06 DIAGNOSIS — E785 Hyperlipidemia, unspecified: Secondary | ICD-10-CM

## 2023-08-06 DIAGNOSIS — E119 Type 2 diabetes mellitus without complications: Secondary | ICD-10-CM

## 2023-08-06 DIAGNOSIS — Z7985 Long-term (current) use of injectable non-insulin antidiabetic drugs: Secondary | ICD-10-CM

## 2023-08-06 DIAGNOSIS — Z7984 Long term (current) use of oral hypoglycemic drugs: Secondary | ICD-10-CM | POA: Diagnosis not present

## 2023-08-06 DIAGNOSIS — E1165 Type 2 diabetes mellitus with hyperglycemia: Secondary | ICD-10-CM | POA: Diagnosis not present

## 2023-08-06 MED ORDER — FREESTYLE LIBRE 3 PLUS SENSOR MISC
1.0000 | 3 refills | Status: DC
  Filled 2023-08-06: qty 6, 84d supply, fill #0
  Filled 2023-10-27 – 2023-11-03 (×2): qty 6, 84d supply, fill #1
  Filled 2023-12-02: qty 6, 84d supply, fill #2
  Filled 2024-04-15: qty 6, 84d supply, fill #3

## 2023-08-06 MED ORDER — TIRZEPATIDE 10 MG/0.5ML ~~LOC~~ SOAJ
10.0000 mg | SUBCUTANEOUS | 3 refills | Status: DC
  Filled 2023-08-06 – 2023-08-25 (×5): qty 6, 84d supply, fill #0
  Filled 2023-11-12: qty 6, 84d supply, fill #1
  Filled 2024-02-04: qty 6, 84d supply, fill #2
  Filled 2024-04-28: qty 6, 84d supply, fill #3

## 2023-08-06 MED ORDER — PIOGLITAZONE HCL 45 MG PO TABS
45.0000 mg | ORAL_TABLET | Freq: Every day | ORAL | 3 refills | Status: DC
  Filled 2023-08-06 – 2023-08-28 (×4): qty 90, 90d supply, fill #0
  Filled 2023-11-25: qty 90, 90d supply, fill #1
  Filled 2024-02-23: qty 90, 90d supply, fill #2
  Filled 2024-05-23: qty 90, 90d supply, fill #3

## 2023-08-06 MED ORDER — COLESEVELAM HCL 625 MG PO TABS
1250.0000 mg | ORAL_TABLET | Freq: Every day | ORAL | 3 refills | Status: DC
  Filled 2023-08-06 – 2023-08-13 (×3): qty 180, 90d supply, fill #0
  Filled 2023-11-15: qty 180, 90d supply, fill #1
  Filled 2024-02-10: qty 180, 90d supply, fill #2
  Filled 2024-05-10: qty 180, 90d supply, fill #3

## 2023-08-06 MED ORDER — EMPAGLIFLOZIN 25 MG PO TABS
25.0000 mg | ORAL_TABLET | Freq: Every day | ORAL | 3 refills | Status: DC
  Filled 2023-08-06 – 2023-08-12 (×3): qty 90, 90d supply, fill #0
  Filled 2023-11-19: qty 90, 90d supply, fill #1
  Filled 2024-02-15: qty 90, 90d supply, fill #2
  Filled 2024-05-13: qty 90, 90d supply, fill #3

## 2023-08-06 NOTE — Patient Instructions (Signed)
 Please continue: - Metformin ER 2000 mg at dinnertime  - Actos 45 mg daily in am - Welchol 625 x 2 tablets daily - Jardiance 25 mg before breakfast - Mounjaro 10 mg weekly   Please return in 3-4 months.

## 2023-08-06 NOTE — Progress Notes (Signed)
 Patient ID: Alfred Johnson, male   DOB: Oct 22, 1970, 53 y.o.   MRN: [id number]  HPI: Alfred Johnson is a 53 y.o.-year-old male, returning for follow-up for DM2, dx in 2008, non-insulin -dependent, fairly well controlled, with complications (cardiovascular disease - Aortic atherosclerosis). Pt. previously saw Dr. Kassie, but last visit with me 4 months ago. Changed [employer] to [employer] and now [employer].  Interim history He has increased urination, but no blurry vision, no chest pain. He had a torn meniscus >> left knee surgery 07/09/2022.  He still had some pain at last visit. He had another surgery in L knee 05/2023 >> sedentary. He also relaxed his diet during the Holidays. Now restarted exercise: swimming and walking. Also, started to improve diet. He had sulfur eructations >> now started probiotics with prn Pepto Bismol >> improved.  Reviewed HbA1c: Lab Results  Component Value Date   HGBA1C 8.7 (H) 07/16/2023   HGBA1C 7.4 (A) 03/23/2023   HGBA1C 7.2 (A) 11/07/2022   HGBA1C 7.7 (A) 07/08/2022   HGBA1C 7.2 (A) 03/17/2022   HGBA1C 7.1 (A) 10/07/2021   HGBA1C 7.2 (A) 06/05/2021   HGBA1C 7.7 (H) 05/27/2021   HGBA1C 7.5 (A) 02/05/2021   HGBA1C 7.0 (A) 10/02/2020   Pt is on a regimen of: - Metformin  ER 2000 mg in am >> at dinnertime (missing less doses - maybe 1-2 a mo) - Actos  45 mg daily in am - Welchol  625 x 2 tablets daily in am  - Jardiance  25 mg before breakfast - increased urination - Ozempic  2 mg weekly - hypersensitivity to foods, nausea, occasional vomiting >> alternating 1 with 2 mg weekly >> 1 mg weekly >> Mounjaro  5 >> 10 mg weekly She previously tried Invokana  >> itching. We stopped bromocriptine  02/2022  Pt checks his sugars >4x a day with his CGM:  Previously:  Prev.:   Lowest sugar was 90s >> 90 >> 80s >> ; he has hypoglycemia awareness at 90.  Highest sugar was 292 >> 279 >> 300s  Glucometer: One Touch ultra  - no CKD, last BUN/creatinine:  Lab  Results  Component Value Date   BUN 18 07/16/2023   BUN 13 07/09/2022   CREATININE 0.91 07/16/2023   CREATININE 0.81 07/09/2022   Lab Results  Component Value Date   MICRALBCREAT 2.9 07/16/2023   MICRALBCREAT 1.1 06/04/2022   MICRALBCREAT 1.5 05/27/2021   MICRALBCREAT 1.1 08/19/2019   MICRALBCREAT 0.9 04/14/2017   MICRALBCREAT 0.5 05/30/2015   MICRALBCREAT 0.6 05/30/2014   MICRALBCREAT 0.2 05/17/2013   MICRALBCREAT 1.1 06/16/2012   MICRALBCREAT 1.1 05/25/2012  On Jardiance .  -+ HL; last set of lipids: Lab Results  Component Value Date   CHOL 92 07/16/2023   HDL 33.30 (L) 07/16/2023   LDLCALC 43 07/16/2023   LDLDIRECT 114.5 02/26/2007   TRIG 77.0 07/16/2023   CHOLHDL 3 07/16/2023  On Lipitor 20 mg daily.  - last eye exam was 05/25/2023. No DR. He did have DR in 2021. [facility].  - no numbness and tingling in his feet.  Previously saw Dr. Magdalen, but not seeing him anymore.  Last foot exam was done by PCP 07/15/2023.  He also has a history of kidney stones, GERD, HTN, obesity. Per review of Dr. Laymond note, he declined weight loss surgery in the past. Cardiology investigation: LP(a) (06/11/2022) was elevated at 81.9, coronary calcium  score was also elevated (06/10/2021) at 383. He had a kidney stone 10/2022.  CT abdomen performed at that time showed aortic atherosclerosis.  ROS: + see HPI  Past Medical History:  Diagnosis Date   Acute medial meniscus tear of right knee 01/05/2018   ANEMIA DUE TO CHRONIC BLOOD LOSS 09/10/2009   Cough 07/09/2010   Qualifier: Diagnosis of   By: Kassie MD, Sean A  HAD CHANGED FROM ACE      Feels like could be from  Cozaar also but not sure has allergy type sx last c xray 2011.     Diabetes mellitus    Elevated BP    GERD (gastroesophageal reflux disease)    HYPERLIPIDEMIA, MIXED 03/23/2007   Hypertension    KIDNEY STONE 02/05/2009   Left lower lobe pneumonia 02/08/2014   VENTRICULAR FUNCTION, DECREASED 06/21/2007   Past  Surgical History:  Procedure Laterality Date   COLONOSCOPY WITH PROPOFOL  N/A 10/02/2021   Procedure: COLONOSCOPY WITH PROPOFOL ;  Surgeon: Unk Corinn Skiff, MD;  Location: [facility];  Service: Gastroenterology;  Laterality: N/A;   ESOPHAGOGASTRODUODENOSCOPY N/A 10/02/2021   Procedure: ESOPHAGOGASTRODUODENOSCOPY (EGD);  Surgeon: Unk Corinn Skiff, MD;  Location: [facility];  Service: Gastroenterology;  Laterality: N/A;   KNEE ARTHROSCOPY WITH MEDIAL MENISECTOMY Left 07/09/2022   Procedure: LEFT KNEE ARTHROSCOPY, DEBRIDEMENT AND PARTIAL MEDIAL MENISCECTOMY;  Surgeon: Jerri Kay HERO, MD;  Location: [facility];  Service: Orthopedics;  Laterality: Left;   Social History   Socioeconomic History   Marital status: Married    Spouse name: Not on file   Number of children: Not on file   Years of education: Not on file   Highest education level: Not on file  Occupational History    Employer: SELF-EMPLOYED    Comment: Does not work outside the home  Tobacco Use   Smoking status: Former   Smokeless tobacco: Former    Quit date: 07/28/1992  Vaping Use   Vaping status: Never Used  Substance and Sexual Activity   Alcohol use: No   Drug use: No   Sexual activity: Not on file  Other Topics Concern   Not on file  Social History Narrative   HH of 3   No pets   Firearms locked away   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    Social Drivers of Corporate Investment Banker Strain: Not on file  Food Insecurity: Not on file  Transportation Needs: Not on file  Physical Activity: Insufficiently Active (07/15/2023)   Exercise Vital Sign    Days of Exercise per Week: 3 days    Minutes of Exercise per Session: 40 min  Stress: No Stress Concern Present (07/15/2023)   [employer] of Occupational Health - Occupational Stress Questionnaire    Feeling of Stress : Not at all  Social Connections:  Not on file  Intimate Partner Violence: Not on file   Current Outpatient Medications on File Prior to Visit  Medication Sig Dispense Refill   aspirin 81 MG tablet Take 81 mg by mouth daily.     atorvastatin  (LIPITOR) 40 MG tablet Take 0.5 tablets (20 mg total) by mouth daily. 45 tablet 3   cetirizine (ZYRTEC) 10 MG tablet Take 10 mg by mouth daily.     colesevelam  (WELCHOL ) 625 MG tablet Take 2 tablets (1,250 mg total) by mouth daily. 180 tablet 3   Continuous Glucose Sensor (FREESTYLE LIBRE 3 SENSOR) MISC Apply 1 sensor every 14 days. 6 each 3   empagliflozin  (JARDIANCE ) 25  MG TABS tablet Take 1 tablet (25 mg total) by mouth daily. 90 tablet 3   metFORMIN  (GLUCOPHAGE -XR) 500 MG 24 hr tablet Take 4 tablets (2,000 mg total) by mouth daily with breakfast. 360 tablet 1   pioglitazone  (ACTOS ) 45 MG tablet Take 1 tablet (45 mg total) by mouth daily. 90 tablet 3   tirzepatide  (MOUNJARO ) 10 MG/0.5ML Pen Inject 10 mg into the skin once a week. 6 mL 3   No current facility-administered medications on file prior to visit.   No Known Allergies Family History  Problem Relation Age of Onset   Graves' disease Mother    Heart attack Father 57       COD   Heart failure Father    Kidney disease Father    Diabetes Brother    HIV Brother    Heart attack Maternal Grandfather    Heart attack Paternal Grandfather    Heart disease Brother 84       CABG   PE: BP 122/78   Pulse 97   Ht 5' 8 (1.727 m)   Wt 281 lb 3.2 oz (127.6 kg)   SpO2 95%   BMI 42.76 kg/m  Wt Readings from Last 10 Encounters:  08/06/23 281 lb 3.2 oz (127.6 kg)  07/15/23 275 lb (124.7 kg)  05/27/23 280 lb 6.4 oz (127.2 kg)  04/01/23 277 lb (125.6 kg)  03/23/23 277 lb 3.2 oz (125.7 kg)  11/07/22 278 lb (126.1 kg)  07/09/22 277 lb 12.5 oz (126 kg)  07/08/22 278 lb (126.1 kg)  06/04/22 277 lb 12.8 oz (126 kg)  05/06/22 278 lb 3.2 oz (126.2 kg)   Constitutional: overweight, in NAD Eyes: no exophthalmos ENT: no thyromegaly,  no cervical lymphadenopathy Cardiovascular: RRR, No MRG Respiratory: CTA B Musculoskeletal: no deformities Skin: no rashes Neurological: + mild tremor with outstretched hands  ASSESSMENT: 1. DM2, non-insulin -dependent, fairly well controlled, with complications -Aortic atherosclerosis-on CT (11/18/2022)  2. HL  3.  Obesity class III  PLAN:  1. Patient with longstanding, uncontrolled, type 2 diabetes, on a complex medication regimen including metformin , TZD, bile acid sequestrant, SGLT2 inhibitor and also weekly GLP-1/GIP receptor agonist, with still suboptimal control.  At last visit HbA1c was 7.4%, slightly higher, but he had another HbA1c obtained last month and this was higher, at 8.7%. -At last visit, sugars were slightly higher than before, fluctuating in the upper half of the target range and increasing gradually overnight, peaking above 180 mg/dL in the morning.  We discussed about options for treatment and he felt that he would benefit from a higher Mounjaro  dose.  We increased the dose and continued the rest of the regimen CGM interpretation: -At today's visit, we reviewed his CGM downloads: It appears that 59% of values are in target range (goal >70%), while 41% are higher than 180 (goal <25%), and 0% are lower than 70 (goal <4%).  The calculated average blood sugar is 173.  The projected HbA1c for the next 3 months (GMI) is 7.4%. -Reviewing the CGM trends, sugars appear to be fluctuating around and just under the upper limit of the target range.  They appear to be improved comparing to the HbA1c obtained last month.  He mentions that after his knee surgery he was less active and he also relaxed his diet but after the holidays he started to improve the diet and indeed, in the last 4 days, sugars have been almost all at goal.  Therefore, for now, I refilled his diabetic medications but  did not suggest a change in regimen. - I suggested to:  Patient Instructions  Please continue: -  Metformin  ER 2000 mg at dinnertime  - Actos  45 mg daily in am - Welchol  625 x 2 tablets daily - Jardiance  25 mg before breakfast - Mounjaro  10 mg weekly   Please return in 3 months.  - advised to check sugars at different times of the day - 4x a day, rotating check times - advised for yearly eye exams >> he is UTD - return to clinic in 3 months  2. HL -Latest lipid panel was reviewed from last month: LDL at goal, HDL slightly low, triglycerides excellent: Lab Results  Component Value Date   CHOL 92 07/16/2023   HDL 33.30 (L) 07/16/2023   LDLCALC 43 07/16/2023   LDLDIRECT 114.5 02/26/2007   TRIG 77.0 07/16/2023   CHOLHDL 3 07/16/2023  -He continues on Lipitor 20 mg daily without side effects  3.  Obesity class III -He gained 4 pounds since last visit due to being sedentary after his knee surgery and also relaxing his diet over the holidays -He is now back to swimming and walking and started to improve the diet -We will continue Mounjaro  at the current dose.  He would have been interested in increasing the dose, we discussed about possible side effects from Mounjaro  that she already had eructations, which now improved on probiotics.  We discussed about adding prebiotics and continuing to use Pepto-Bismol or even Mylanta if he has irritations.  Lela Fendt, MD PhD [facility]

## 2023-08-12 ENCOUNTER — Other Ambulatory Visit: Payer: Self-pay

## 2023-08-12 ENCOUNTER — Other Ambulatory Visit (HOSPITAL_BASED_OUTPATIENT_CLINIC_OR_DEPARTMENT_OTHER): Payer: Self-pay

## 2023-08-13 ENCOUNTER — Other Ambulatory Visit (HOSPITAL_BASED_OUTPATIENT_CLINIC_OR_DEPARTMENT_OTHER): Payer: Self-pay

## 2023-08-13 ENCOUNTER — Other Ambulatory Visit: Payer: Self-pay

## 2023-08-17 ENCOUNTER — Other Ambulatory Visit (HOSPITAL_BASED_OUTPATIENT_CLINIC_OR_DEPARTMENT_OTHER): Payer: Self-pay

## 2023-08-18 ENCOUNTER — Other Ambulatory Visit (HOSPITAL_BASED_OUTPATIENT_CLINIC_OR_DEPARTMENT_OTHER): Payer: Self-pay

## 2023-08-18 ENCOUNTER — Encounter: Payer: Self-pay | Admitting: Internal Medicine

## 2023-08-19 ENCOUNTER — Other Ambulatory Visit (HOSPITAL_COMMUNITY): Payer: Self-pay

## 2023-08-19 ENCOUNTER — Other Ambulatory Visit (HOSPITAL_BASED_OUTPATIENT_CLINIC_OR_DEPARTMENT_OTHER): Payer: Self-pay

## 2023-08-19 ENCOUNTER — Telehealth: Payer: Self-pay

## 2023-08-19 NOTE — Telephone Encounter (Signed)
Pt needs a PA for [place] and [employer]

## 2023-08-19 NOTE — Telephone Encounter (Signed)
Pharmacy Patient Advocate Encounter   Received notification from Pt Calls Messages that prior authorization for Jardiance is required/requested.   Insurance verification completed.   The patient is insured through [employer] .   Per test claim: PA required; PA submitted to above mentioned insurance via CoverMyMeds Key/confirmation #/[id number] Status is pending

## 2023-08-19 NOTE — Telephone Encounter (Signed)
Pharmacy Patient Advocate Encounter   Received notification from Pt Calls Messages that prior authorization for Alfred Johnson is required/requested.   Insurance verification completed.   The patient is insured through [employer] .   Per test claim: PA required; PA submitted to above mentioned insurance via CoverMyMeds Key/confirmation #/EOC [facility] Status is pending

## 2023-08-20 ENCOUNTER — Other Ambulatory Visit (HOSPITAL_BASED_OUTPATIENT_CLINIC_OR_DEPARTMENT_OTHER): Payer: Self-pay

## 2023-08-21 ENCOUNTER — Other Ambulatory Visit: Payer: Self-pay

## 2023-08-21 ENCOUNTER — Other Ambulatory Visit (HOSPITAL_BASED_OUTPATIENT_CLINIC_OR_DEPARTMENT_OTHER): Payer: Self-pay

## 2023-08-24 ENCOUNTER — Other Ambulatory Visit (HOSPITAL_BASED_OUTPATIENT_CLINIC_OR_DEPARTMENT_OTHER): Payer: Self-pay

## 2023-08-25 ENCOUNTER — Other Ambulatory Visit (HOSPITAL_BASED_OUTPATIENT_CLINIC_OR_DEPARTMENT_OTHER): Payer: Self-pay

## 2023-08-25 ENCOUNTER — Encounter: Payer: Self-pay | Admitting: Internal Medicine

## 2023-08-25 ENCOUNTER — Ambulatory Visit (INDEPENDENT_AMBULATORY_CARE_PROVIDER_SITE_OTHER): Payer: 59 | Admitting: Gastroenterology

## 2023-08-25 ENCOUNTER — Encounter: Payer: Self-pay | Admitting: Gastroenterology

## 2023-08-25 VITALS — BP 132/91 | HR 98 | Temp 97.3°F | Ht 68.0 in | Wt 280.5 lb

## 2023-08-25 DIAGNOSIS — R142 Eructation: Secondary | ICD-10-CM | POA: Diagnosis not present

## 2023-08-25 DIAGNOSIS — R143 Flatulence: Secondary | ICD-10-CM | POA: Diagnosis not present

## 2023-08-25 DIAGNOSIS — R1013 Epigastric pain: Secondary | ICD-10-CM

## 2023-08-25 MED ORDER — OMEPRAZOLE 40 MG PO CPDR
40.0000 mg | DELAYED_RELEASE_CAPSULE | Freq: Every day | ORAL | 1 refills | Status: DC
  Filled 2023-08-25: qty 30, 30d supply, fill #0

## 2023-08-25 NOTE — Progress Notes (Signed)
Arlyss Repress, MD [address]  [address]  [place], [place] [address]  Main: [phone]  Fax: [phone]    Gastroenterology Consultation  Referring Provider:     Madelin Headings, MD Primary Care Physician:  Madelin Headings, MD Primary Gastroenterologist:  Dr. Arlyss Repress Reason for Consultation: Sulfur burps and increased gas        HPI:   Alfred Johnson is a 53 y.o. male referred by Dr. Fabian Sharp, Neta Mends, MD  for consultation & management of several months history of rancid belches that have been occurring frequently.  He also reports intermittent, sporadic episodes of nausea and vomiting during a meal with no particular relation to the type of food.  He has history of chronic GERD since his 41s, was taking over-the-counter antacids as needed.  He started taking omeprazole 10 mg once a day, later switched to 20 mg once a day about 8 months ago.  He denies typical heartburn, regurgitation, epigastric pain.  He denies abdominal bloating.  He has history of diabetes, hemoglobin A1c around 7.  Patient also reports variable stool consistency anywhere from formed to loose mushy and 3-4 daily.  He denies any abdominal pain or rectal bleeding.  Patient has cut back on carbonated beverages.  He consumes red meat 3 times a week.  His weight has been stable.  He started swimming.  He is a Veterinary surgeon.  Follow-up visit 08/25/2023 Alfred Johnson has switch from Ozempic to [facility] about 8 months ago and noticed worsening of sulfur burps as well as increased gas on higher dose of Mounjaro 10 mg once a week.  He also has worsening of diabetes, HbA1c 8.7 in 06/2023, attributes it to increased food intake during Thanksgiving and Christmas.  He started taking probiotic and Pepto-Bismol which seem to have significantly improved his symptoms.  He is also taking omeprazole 40 mg daily.  He denies any nausea or vomiting or epigastric pain.  He is not noticing any weight loss on GLP-1   He denies any  GI surgeries NSAIDs: None  Antiplts/Anticoagulants/Anti thrombotics: None  GI Procedures:  Upper endoscopy and colonoscopy 10/02/2021 - Normal duodenal bulb and second portion of the duodenum. - Small hiatal hernia. - Normal stomach. - Normal gastroesophageal junction and esophagus.  - The examined portion of the ileum was normal. - Two 3 to 4 mm polyps in the descending colon, removed with a cold snare. Resected and retrieved. - Moderate diverticulosis in the recto- sigmoid colon, in the sigmoid colon and in the descending colon. There was no evidence of diverticular bleeding. - Non- bleeding external hemorrhoids.  DIAGNOSIS:  A. COLON POLYP X2, DESCENDING; COLD SNARE:  - TUBULAR ADENOMA (2).  - NEGATIVE FOR HIGH-GRADE DYSPLASIA AND MALIGNANCY.  No known family history of GI malignancy  Past Medical History:  Diagnosis Date   Acute medial meniscus tear of right knee 01/05/2018   ANEMIA DUE TO CHRONIC BLOOD LOSS 09/10/2009   Cough 07/09/2010   Qualifier: Diagnosis of   By: Everardo All MD, Sean A  HAD CHANGED FROM ACE      Feels like could be from  Cozaar also but not sure has allergy type sx last c xray 2011.     Diabetes mellitus    Elevated BP    GERD (gastroesophageal reflux disease)    HYPERLIPIDEMIA, MIXED 03/23/2007   Hypertension    KIDNEY STONE 02/05/2009   Left lower lobe pneumonia 02/08/2014   VENTRICULAR FUNCTION, DECREASED 06/21/2007  Past Surgical History:  Procedure Laterality Date   COLONOSCOPY WITH PROPOFOL N/A 10/02/2021   Procedure: COLONOSCOPY WITH PROPOFOL;  Surgeon: Toney Reil, MD;  Location: [facility] ENDOSCOPY;  Service: Gastroenterology;  Laterality: N/A;   ESOPHAGOGASTRODUODENOSCOPY N/A 10/02/2021   Procedure: ESOPHAGOGASTRODUODENOSCOPY (EGD);  Surgeon: Toney Reil, MD;  Location: [facility] ENDOSCOPY;  Service: Gastroenterology;  Laterality: N/A;   KNEE ARTHROSCOPY WITH MEDIAL MENISECTOMY Left 07/09/2022   Procedure: LEFT KNEE ARTHROSCOPY, DEBRIDEMENT  AND PARTIAL MEDIAL MENISCECTOMY;  Surgeon: Tarry Kos, MD;  Location: [facility];  Service: Orthopedics;  Laterality: Left;    Current Outpatient Medications:    aspirin 81 MG tablet, Take 81 mg by mouth daily., Disp: , Rfl:    atorvastatin (LIPITOR) 40 MG tablet, Take 0.5 tablets (20 mg total) by mouth daily., Disp: 45 tablet, Rfl: 3   cetirizine (ZYRTEC) 10 MG tablet, Take 10 mg by mouth daily., Disp: , Rfl:    colesevelam (WELCHOL) 625 MG tablet, Take 2 tablets (1,250 mg total) by mouth daily., Disp: 180 tablet, Rfl: 3   Continuous Glucose Sensor (FREESTYLE LIBRE 3 PLUS SENSOR) MISC, 1 each by Does not apply route every 14 (fourteen) days., Disp: 6 each, Rfl: 3   empagliflozin (JARDIANCE) 25 MG TABS tablet, Take 1 tablet (25 mg total) by mouth daily., Disp: 90 tablet, Rfl: 3   metFORMIN (GLUCOPHAGE-XR) 500 MG 24 hr tablet, Take 4 tablets (2,000 mg total) by mouth daily with breakfast., Disp: 360 tablet, Rfl: 1   omeprazole (PRILOSEC) 40 MG capsule, Take 1 capsule (40 mg total) by mouth daily before breakfast., Disp: 30 capsule, Rfl: 1   pioglitazone (ACTOS) 45 MG tablet, Take 1 tablet (45 mg total) by mouth daily., Disp: 90 tablet, Rfl: 3   Saccharomyces boulardii (DAILY PROBIOTIC SUPPLEMENT PO), Take by mouth. 10 strain Probiotic (Members Mortimer Fries), Disp: , Rfl:    tirzepatide (MOUNJARO) 10 MG/0.5ML Pen, Inject 10 mg into the skin once a week., Disp: 6 mL, Rfl: 3  Family History  Problem Relation Age of Onset   Graves' disease Mother    Heart attack Father 65       COD   Heart failure Father    Kidney disease Father    Diabetes Brother    HIV Brother    Heart attack Maternal Grandfather    Heart attack Paternal Grandfather    Heart disease Brother 51       CABG     Social History   Tobacco Use   Smoking status: Former   Smokeless tobacco: Former    Quit date: 07/28/1992  Vaping Use   Vaping status: Never Used  Substance Use Topics   Alcohol use: No    Drug use: No    Allergies as of 08/25/2023   (No Known Allergies)    Review of Systems:    All systems reviewed and negative except where noted in HPI.   Physical Exam:  BP (!) 132/91 (BP Location: Left Arm, Patient Position: Sitting, Cuff Size: Large)   Pulse 98   Temp (!) 97.3 F (36.3 C) (Oral)   Ht 5\' 8"  (1.727 m)   Wt 280 lb 8 oz (127.2 kg)   BMI 42.65 kg/m  No LMP for male patient.  General:   Alert,  Well-developed, well-nourished, pleasant and cooperative in NAD Head:  Normocephalic and atraumatic. Eyes:  Sclera clear, no icterus.   Conjunctiva pink. Ears:  Normal auditory acuity. Nose:  No deformity, discharge, or lesions. Mouth:  No deformity or lesions,oropharynx pink & moist. Neck:  Supple; no masses or thyromegaly. Lungs:  Respirations even and unlabored.  Clear throughout to auscultation.   No wheezes, crackles, or rhonchi. No acute distress. Heart:  Regular rate and rhythm; no murmurs, clicks, rubs, or gallops. Abdomen:  Normal bowel sounds. Soft, non-tender and non-distended without masses, hepatosplenomegaly or hernias noted.  No guarding or rebound tenderness.   Rectal: Not performed Msk:  Symmetrical without gross deformities. Good, equal movement & strength bilaterally. Pulses:  Normal pulses noted. Extremities:  No clubbing or edema.  No cyanosis. Neurologic:  Alert and oriented x3;  grossly normal neurologically. Skin:  Intact without significant lesions or rashes. No jaundice. Psych:  Alert and cooperative. Normal mood and affect.  Imaging Studies: No abdominal imaging  Assessment and Plan:   Alfred Johnson is a 53 y.o. pleasant Caucasian male with metabolic syndrome, chronic GERD is seen in consultation for sulfur burps, increased gas on Mounjaro  Likely multifactorial, diabetic gastroparesis, side effect of Mounjaro Today, I have discussed again regarding management of diabetes to improve his symptoms He can continue probiotic and  Pepto-Bismol which seemed to help his symptoms although there is no clear recommendation about his medications and management of dyspepsia Continue omeprazole 40 mg daily before breakfast, prescription sent EGD unremarkable in 2023 Recommend gastric emptying study if his symptoms worsen   Follow up as needed   Arlyss Repress, MD

## 2023-08-26 ENCOUNTER — Encounter: Payer: Self-pay | Admitting: Internal Medicine

## 2023-08-26 NOTE — Telephone Encounter (Signed)
I don't believe  that preventive tamiflu antivirals would be that beneficial based on current  level of knowledge   and has potential for side effects.     If you get flu   illness and begin tamiflu early    may help decrease  illness by one day. Many people dont take medication at all . But not unreasonable  with  the diabetes  condition . There is another antiviral that is expensive  ...once dose  baloxavir  Having had the vaccine was the most effective risk reduction of serious disease. Marland Kitchen

## 2023-08-28 ENCOUNTER — Other Ambulatory Visit: Payer: Self-pay

## 2023-09-01 ENCOUNTER — Other Ambulatory Visit (HOSPITAL_BASED_OUTPATIENT_CLINIC_OR_DEPARTMENT_OTHER): Payer: Self-pay

## 2023-09-15 ENCOUNTER — Other Ambulatory Visit (HOSPITAL_BASED_OUTPATIENT_CLINIC_OR_DEPARTMENT_OTHER): Payer: Self-pay

## 2023-09-21 ENCOUNTER — Other Ambulatory Visit: Payer: Self-pay | Admitting: Gastroenterology

## 2023-09-21 ENCOUNTER — Other Ambulatory Visit: Payer: Self-pay | Admitting: Internal Medicine

## 2023-09-21 ENCOUNTER — Other Ambulatory Visit (HOSPITAL_BASED_OUTPATIENT_CLINIC_OR_DEPARTMENT_OTHER): Payer: Self-pay

## 2023-09-21 DIAGNOSIS — E119 Type 2 diabetes mellitus without complications: Secondary | ICD-10-CM

## 2023-09-21 MED ORDER — OMEPRAZOLE 40 MG PO CPDR
40.0000 mg | DELAYED_RELEASE_CAPSULE | Freq: Every day | ORAL | 2 refills | Status: DC
  Filled 2023-09-21: qty 90, 90d supply, fill #0
  Filled 2023-12-19 (×2): qty 90, 90d supply, fill #1
  Filled 2024-03-25: qty 90, 90d supply, fill #2

## 2023-09-22 ENCOUNTER — Other Ambulatory Visit (HOSPITAL_BASED_OUTPATIENT_CLINIC_OR_DEPARTMENT_OTHER): Payer: Self-pay

## 2023-09-22 ENCOUNTER — Other Ambulatory Visit: Payer: Self-pay

## 2023-09-22 MED ORDER — METFORMIN HCL ER 500 MG PO TB24
2000.0000 mg | ORAL_TABLET | Freq: Every day | ORAL | 3 refills | Status: DC
  Filled 2023-09-22: qty 360, 90d supply, fill #0
  Filled 2023-12-18: qty 360, 90d supply, fill #1
  Filled 2024-03-17: qty 360, 90d supply, fill #2
  Filled 2024-06-14: qty 360, 90d supply, fill #3

## 2023-09-23 ENCOUNTER — Other Ambulatory Visit (HOSPITAL_BASED_OUTPATIENT_CLINIC_OR_DEPARTMENT_OTHER): Payer: Self-pay

## 2023-10-20 ENCOUNTER — Ambulatory Visit (INDEPENDENT_AMBULATORY_CARE_PROVIDER_SITE_OTHER): Payer: 59 | Admitting: Internal Medicine

## 2023-10-20 ENCOUNTER — Encounter (HOSPITAL_BASED_OUTPATIENT_CLINIC_OR_DEPARTMENT_OTHER): Payer: Self-pay | Admitting: Internal Medicine

## 2023-10-20 VITALS — BP 128/72 | HR 100 | Ht 69.0 in | Wt 276.0 lb

## 2023-10-20 DIAGNOSIS — R931 Abnormal findings on diagnostic imaging of heart and coronary circulation: Secondary | ICD-10-CM

## 2023-10-20 DIAGNOSIS — E785 Hyperlipidemia, unspecified: Secondary | ICD-10-CM

## 2023-10-20 DIAGNOSIS — E7841 Elevated Lipoprotein(a): Secondary | ICD-10-CM

## 2023-10-20 DIAGNOSIS — E119 Type 2 diabetes mellitus without complications: Secondary | ICD-10-CM

## 2023-10-20 NOTE — Progress Notes (Signed)
 OFFICE NOTE  Chief Complaint:  No complaints  Primary Care Physician: Madelin Headings, MD  HPI:  Alfred Johnson is a pleasant 53 year old male who is coming referred to me by Dr. Fabian Sharp for evaluation of coronary disease. His past history significant for diabetes followed by Dr. Everardo All in endocrinology. He does not have hypertension and is not on treatment for any dyslipidemia. His last lipid profile showed an LDL cholesterol 95 with total cholesterol less than 160. He does have a strong family history of heart disease with heart attacks in his father and both grandfathers. He also recently had a brother who had 3 vessel, bypass one month ago. Since that time he's been having some intermittent symptoms with vague, atypical sounding chest pain and occasional lightheadedness. He did have a pneumonia last summer. Fortunately, he is very active. He's been doing significant exercise several days a week including both aerobic and resistance training. He's managed to have no symptoms such as chest pain or worsening shortness of breath during these exercises.  [name] returns today for follow-up. He underwent a CT coronary artery calcium score. This resulted back is 0. Unfortunately he was noted to have some small pulmonary nodules. He will need a repeat plain chest CT in one year. He also obtained a cholesterol profile which demonstrated an LDL-P of 1566, LDL-C of 117, HDL 45, triglycerides 64 and total cholesterol 175. Although he has no chronic calcium, the diabetic he should have risk factor modification. We talked about cholesterol medications and he did research with his insurance company as to what medications would be the most cost effective. He is interested in taking Crestor which I think is a good medication.  02/11/2016  [name] was seen today in follow-up. Over the past year he's done fairly well. His blood sugars however have been elevated and recently he was started on Invokana  in addition to his other diabetes medicines. Blood pressure appears well controlled today. He denies any chest pain or worsening shortness of breath. Of note his PCP recheck his cholesterol November 2016 which showed a total cholesterol of 86, triglycerides 47, HDL 40 and LDL of 36. This represents significant cholesterol control.  02/17/2017  [name] returns today for follow-up. Spent a year since I last saw him. As a recap he had an initial coronary artery calcium score which was 0 however subsequently he had a follow-up of a small pulmonary nodule which was found to be stable, but the radiologist indicated there was a small speck of calcium in the LAD. Either this is relevant however since he is diagnosed as diabetic. He is at high risk and therefore will need a strict cholesterol control with a goal LDL-C less than 70. He was initially placed on atorvastatin 40 mg daily however did have side effects including myalgias on that dose. I asked him to reduce the dose to 20 mg daily a says is tolerating this just fine. He reports taking the medicine regularly. Unfortunately has had some weight gain and says that his diet is "horrible". He says that he goes in phases as far as exercise and diet. I told him that we would really want to be aggressive, particularly looking at his lifetime risk, which he found to be funny. He said that no males in his family have lived to be greater than 79 years old. Despite that, he does want to live a longer, healthier life.  03/19/2018  [name] is seen today in  follow-up.  Overall he is doing well without complaints.  Denies any chest pain or worsening shortness of breath.  He is due for repeat lipid profile.  He said his hemoglobin A1c is now down to 6.8.  Recently was started on low-dose bromocriptine which may show some additional benefit apparently diabetics.  Unfortunately not been able to lose a lot of weight.  Is been struggling with a meniscal tear in his knee  which is been repaired and will likely need to be repaired again.  12/09/2019  [name] seen today in follow-up.  Overall he continues to do well.  He denies any chest pain or worsening shortness of breath.  Recently he has lost about 20 pounds.  He started to become more physically active.  Hemoglobin A1c in January was 8.4 this come down to 7.1.  His LDL was 68.  He needs a refill of his atorvastatin.  Blood pressures well controlled today 121/72.  EKG shows normal sinus rhythm.  05/06/2022  [name] continues to do well.  He denies any chest pain or shortness of breath.  Weight has been fairly stable although he does exercise and has been swimming here at the [facility].  He is A1c is 7.2%.  Last LDL cholesterol less than 70.  As mentioned before he had 0 coronary calcium in 2016 but subsequently was noted to have some calcification in the LAD by a chest CT, noncontrast 1 year later.  He has been treated aggressively because of his diabetes however wonders as to whether or not he has developed more evidence of any coronary artery disease.  05/27/2023  [name] is here today for follow-up.  Overall he says he is feeling fairly well although continues to struggle with bilateral knee pain and meniscal tears.  EKG today shows a sinus tachycardia.  Blood pressure is well-controlled.  Surprisingly he developed a significant increase in coronary calcification based on his last CT scan.  This was a calcium score last year which showed a calcium score of 383, 96 percentile for age and sex matched controls.  This is despite having had well-controlled dyslipidemia on statin therapy with an LDL less than 70.  I did go ahead and assess for an elevated LP(a) and his was noted to be mildly elevated 81.9 nmol/L.  Is not clear to me however that this is increasing his cardiovascular risk substantially.  There may be other factors such as ongoing inflammation.  Given the fact that he is  diabetic as well, I would likely target his LDL to less than 55 for more aggressive lipid-lowering.  He is due for repeat lipid testing.  10/20/2023  Mr. Hsiao is seen today in follow-up.  Overall he says he is feeling well.  His A1c had jumped up but it was confusing as to whether it was a problem with the lab or his continuous glucose monitor because they gave divergent readings.  His lipids however were pretty good in December with total cholesterol 92, HDL was 33, triglycerides 77 and LDL 43.  This is a goal LDL less than 55.  We have not added additional therapy.  I expect that if he continues to improve his A1c and there is some weight loss his cholesterol may look even better.  PMHx:  Past Medical History:  Diagnosis Date   Acute medial meniscus tear of right knee 01/05/2018   ANEMIA DUE TO CHRONIC BLOOD LOSS 09/10/2009   Cough 07/09/2010   Qualifier: Diagnosis  of   By: Everardo All MD, Sean A  HAD CHANGED FROM ACE      Feels like could be from  Cozaar also but not sure has allergy type sx last c xray 2011.     Diabetes mellitus    Elevated BP    GERD (gastroesophageal reflux disease)    HYPERLIPIDEMIA, MIXED 03/23/2007   Hypertension    KIDNEY STONE 02/05/2009   Left lower lobe pneumonia 02/08/2014   VENTRICULAR FUNCTION, DECREASED 06/21/2007    Past Surgical History:  Procedure Laterality Date   COLONOSCOPY WITH PROPOFOL N/A 10/02/2021   Procedure: COLONOSCOPY WITH PROPOFOL;  Surgeon: Toney Reil, MD;  Location: [facility] ENDOSCOPY;  Service: Gastroenterology;  Laterality: N/A;   ESOPHAGOGASTRODUODENOSCOPY N/A 10/02/2021   Procedure: ESOPHAGOGASTRODUODENOSCOPY (EGD);  Surgeon: Toney Reil, MD;  Location: [facility] ENDOSCOPY;  Service: Gastroenterology;  Laterality: N/A;   KNEE ARTHROSCOPY WITH MEDIAL MENISECTOMY Left 07/09/2022   Procedure: LEFT KNEE ARTHROSCOPY, DEBRIDEMENT AND PARTIAL MEDIAL MENISCECTOMY;  Surgeon: Tarry Kos, MD;  Location: [facility];   Service: Orthopedics;  Laterality: Left;    FAMHx:  Family History  Problem Relation Age of Onset   Graves' disease Mother    Heart attack Father 26       COD   Heart failure Father    Kidney disease Father    Diabetes Brother    HIV Brother    Heart attack Maternal Grandfather    Heart attack Paternal Grandfather    Heart disease Brother 42       CABG    SOCHx:   reports that he has quit smoking. He quit smokeless tobacco use about 31 years ago. He reports that he does not drink alcohol and does not use drugs.  ALLERGIES:  No Known Allergies  ROS: Pertinent items noted in HPI and remainder of comprehensive ROS otherwise negative.  HOME MEDS: Current Outpatient Medications  Medication Sig Dispense Refill   aspirin 81 MG tablet Take 81 mg by mouth daily.     atorvastatin (LIPITOR) 40 MG tablet Take 0.5 tablets (20 mg total) by mouth daily. 45 tablet 3   cetirizine (ZYRTEC) 10 MG tablet Take 10 mg by mouth daily.     colesevelam (WELCHOL) 625 MG tablet Take 2 tablets (1,250 mg total) by mouth daily. 180 tablet 3   Continuous Glucose Sensor (FREESTYLE LIBRE 3 PLUS SENSOR) MISC 1 each by Does not apply route every 14 (fourteen) days. 6 each 3   empagliflozin (JARDIANCE) 25 MG TABS tablet Take 1 tablet (25 mg total) by mouth daily. 90 tablet 3   metFORMIN (GLUCOPHAGE-XR) 500 MG 24 hr tablet Take 4 tablets (2,000 mg total) by mouth daily with breakfast. 360 tablet 3   omeprazole (PRILOSEC) 40 MG capsule Take 1 capsule (40 mg total) by mouth daily before breakfast. 90 capsule 2   pioglitazone (ACTOS) 45 MG tablet Take 1 tablet (45 mg total) by mouth daily. 90 tablet 3   Saccharomyces boulardii (DAILY PROBIOTIC SUPPLEMENT PO) Take by mouth. 10 strain Probiotic (Members Mark Brand)     tirzepatide [facility]) 10 MG/0.5ML Pen Inject 10 mg into the skin once a week. 6 mL 3   No current facility-administered medications for this visit.    LABS/IMAGING: No results found for this or  any previous visit (from the past 48 hours). No results found.  WEIGHTS: Wt Readings from Last 3 Encounters:  10/20/23 276 lb (125.2 kg)  08/25/23 280 lb 8 oz (127.2 kg)  08/06/23 281 lb 3.2 oz (127.6 kg)    VITALS: BP 128/72 (BP Location: Left Arm, Patient Position: Sitting, Cuff Size: Large)   Pulse 100   Ht 5\' 9"  (1.753 m)   Wt 276 lb (125.2 kg)   SpO2 94%   BMI 40.76 kg/m   EXAM: Deferred  EKG: Deferred  ASSESSMENT: Atypical chest pain - Zero CAC (10/2014), repeat CAC score of 383 (96%) in 2023 Strong family history of coronary disease Elevated Lp(a) at 81.9 nmol/L Type 2 diabetes Obesity Dyslipidemia, goal LDL less than 55 (very high risk). Solitary pulmonary nodule - benign  PLAN: 1.   Mr. Busch seems to be doing well without any chest pain or worsening shortness of breath.  LDL reached target less than 55 back in December but his A1c was elevated.  He says unfortunately has not lost much weight on Mounjaro but had some GI issues which have improved.  Hopefully he can go on higher dosing.  Additional weight loss will also help his cholesterol.  Follow-up with repeat lipids in 6 months with Marcelino Duster or sooner as necessary.  Chrystie Nose, MD, [facility], FACP  [place]  [facility]  Medical Director of the Advanced Lipid Disorders &  Cardiovascular Risk Reduction Clinic Diplomate of the American Board of Clinical Lipidology Attending Cardiologist  Direct Dial: [phone]  Fax: [phone]  Website:  [email or web address] 10/20/2023, 8:13 AM

## 2023-10-20 NOTE — Patient Instructions (Signed)
 Medication Instructions:  NO CHANGES  Lab Work: FASTING lab work late Sept 2025 ** complete about one week before next appointment    Follow-Up: At [facility], you and your health needs are our priority.  As part of our continuing mission to provide you with exceptional heart care, we have created designated Provider Care Teams.  These Care Teams include your primary Cardiologist (physician) and Advanced Practice Providers (APPs -  Physician Assistants and Nurse Practitioners) who all work together to provide you with the care you need, when you need it.  We recommend signing up for the patient portal called "MyChart".  Sign up information is provided on this After Visit Summary.  MyChart is used to connect with patients for Virtual Visits (Telemedicine).  Patients are able to view lab/test results, encounter notes, upcoming appointments, etc.  Non-urgent messages can be sent to your provider as well.   To learn more about what you can do with MyChart, go to [email or web address].    Your next appointment:   6 months with Eligha Bridegroom NP -- lipid clinic

## 2023-10-27 ENCOUNTER — Telehealth: Payer: Self-pay | Admitting: Pharmacy Technician

## 2023-10-27 ENCOUNTER — Other Ambulatory Visit (HOSPITAL_BASED_OUTPATIENT_CLINIC_OR_DEPARTMENT_OTHER): Payer: Self-pay

## 2023-10-27 ENCOUNTER — Other Ambulatory Visit (HOSPITAL_COMMUNITY): Payer: Self-pay

## 2023-10-27 NOTE — Telephone Encounter (Signed)
 Pharmacy Patient Advocate Encounter   Received notification from CoverMyMeds that prior authorization for FreeStyle Libre 3 Plus Sensor is required/requested.   Insurance verification completed.   The patient is insured through [employer] .   Per test claim: PA required; PA submitted to above mentioned insurance via CoverMyMeds Key/confirmation #/[facility] Status is pending

## 2023-10-28 ENCOUNTER — Other Ambulatory Visit (HOSPITAL_BASED_OUTPATIENT_CLINIC_OR_DEPARTMENT_OTHER): Payer: Self-pay

## 2023-10-30 ENCOUNTER — Other Ambulatory Visit (HOSPITAL_BASED_OUTPATIENT_CLINIC_OR_DEPARTMENT_OTHER): Payer: Self-pay

## 2023-11-02 ENCOUNTER — Encounter: Payer: Self-pay | Admitting: Internal Medicine

## 2023-11-02 ENCOUNTER — Other Ambulatory Visit (HOSPITAL_BASED_OUTPATIENT_CLINIC_OR_DEPARTMENT_OTHER): Payer: Self-pay

## 2023-11-02 NOTE — Telephone Encounter (Signed)
 Pharmacy Patient Advocate Encounter  Received notification from [employer] that Prior Authorization for [facility] 3 plus has been DENIED.  See denial reason below. No denial letter attached in CMM. Will attach denial letter to Media tab once received.   PA #/Case ID/Reference #: [id number]

## 2023-11-02 NOTE — Telephone Encounter (Signed)
 Pt is requesting a follow up on this

## 2023-11-02 NOTE — Telephone Encounter (Signed)
Pt is already notified.

## 2023-11-03 ENCOUNTER — Other Ambulatory Visit (HOSPITAL_BASED_OUTPATIENT_CLINIC_OR_DEPARTMENT_OTHER): Payer: Self-pay

## 2023-11-19 ENCOUNTER — Ambulatory Visit (INDEPENDENT_AMBULATORY_CARE_PROVIDER_SITE_OTHER): Payer: Managed Care, Other (non HMO) | Admitting: Internal Medicine

## 2023-11-19 ENCOUNTER — Encounter: Payer: Self-pay | Admitting: Internal Medicine

## 2023-11-19 ENCOUNTER — Other Ambulatory Visit (HOSPITAL_COMMUNITY): Payer: Self-pay

## 2023-11-19 ENCOUNTER — Other Ambulatory Visit (HOSPITAL_BASED_OUTPATIENT_CLINIC_OR_DEPARTMENT_OTHER): Payer: Self-pay

## 2023-11-19 ENCOUNTER — Other Ambulatory Visit: Payer: Self-pay

## 2023-11-19 VITALS — BP 122/70 | HR 90 | Ht 69.0 in | Wt 274.0 lb

## 2023-11-19 DIAGNOSIS — Z7985 Long-term (current) use of injectable non-insulin antidiabetic drugs: Secondary | ICD-10-CM

## 2023-11-19 DIAGNOSIS — E1165 Type 2 diabetes mellitus with hyperglycemia: Secondary | ICD-10-CM

## 2023-11-19 DIAGNOSIS — E785 Hyperlipidemia, unspecified: Secondary | ICD-10-CM

## 2023-11-19 DIAGNOSIS — Z7984 Long term (current) use of oral hypoglycemic drugs: Secondary | ICD-10-CM

## 2023-11-19 LAB — POCT GLYCOSYLATED HEMOGLOBIN (HGB A1C): Hemoglobin A1C: 8.2 % — AB (ref 4.0–5.6)

## 2023-11-19 MED ORDER — INSULIN PEN NEEDLE 32G X 4 MM MISC
3 refills | Status: AC
  Filled 2023-11-19: qty 100, 90d supply, fill #0
  Filled 2023-12-28 – 2024-02-13 (×3): qty 100, 90d supply, fill #1
  Filled 2024-05-05 – 2024-05-06 (×2): qty 100, 90d supply, fill #2
  Filled 2024-08-02: qty 100, 90d supply, fill #3

## 2023-11-19 MED ORDER — TRESIBA FLEXTOUCH 200 UNIT/ML ~~LOC~~ SOPN
12.0000 [IU] | PEN_INJECTOR | SUBCUTANEOUS | 3 refills | Status: DC
  Filled 2023-11-19 (×2): qty 6, 90d supply, fill #0

## 2023-11-19 NOTE — Patient Instructions (Addendum)
 Please continue: - Metformin  ER 2000 mg at dinnertime  - Actos  45 mg daily in am - Welchol  625 x 2 tablets daily - Jardiance  25 mg before breakfast - Mounjaro  10 mg weekly   Please start: - Tresiba  U200 12 units daily and increase by 2 units every 2-4 days until sugars in am are <130  Please return in 3-4 months.

## 2023-11-19 NOTE — Progress Notes (Signed)
 Patient ID: Alfred Johnson, male   DOB: 1971/02/25, 53 y.o.   MRN: [id number]  HPI: Alfred Johnson is a 53 y.o.-year-old male, returning for follow-up for DM2, dx in 2008, non-insulin -dependent, fairly well controlled, with complications (cardiovascular disease - Aortic atherosclerosis). Pt. previously saw Dr. Washington Hacker, but last visit with me 3 months ago. Changed [employer] to [employer] and now [employer].  Interim history He has increased urination, but no blurry vision, no chest pain. No GI sxs after starting probiotics. He had sulfur eructations >> now started probiotics with prn Pepto Bismol >> improved.  Reviewed HbA1c: Lab Results  Component Value Date   HGBA1C 8.7 (H) 07/16/2023   HGBA1C 7.4 (A) 03/23/2023   HGBA1C 7.2 (A) 11/07/2022   HGBA1C 7.7 (A) 07/08/2022   HGBA1C 7.2 (A) 03/17/2022   HGBA1C 7.1 (A) 10/07/2021   HGBA1C 7.2 (A) 06/05/2021   HGBA1C 7.7 (H) 05/27/2021   HGBA1C 7.5 (A) 02/05/2021   HGBA1C 7.0 (A) 10/02/2020   Pt is on a regimen of: - Metformin  ER 2000 mg in am >> at dinnertime (missing less doses - maybe 1-2 a mo) - Actos  45 mg daily in am - Welchol  625 x 2 tablets daily in am  - Jardiance  25 mg before breakfast - Ozempic  2 mg weekly - hypersensitivity to foods, nausea, occasional vomiting >> alternating 1 with 2 mg weekly >> 1 mg weekly >> Mounjaro  5 >> 10 mg weekly She previously tried Invokana  >> itching. We stopped bromocriptine  02/2022  Pt checks his sugars >4x a day with his CGM:  Previously:  Previously:  Lowest sugar was 90s >> 90 >> 80s >> 100; he has hypoglycemia awareness at 90.  Highest sugar was 292 >> 279 >> 300s >> 200.  Glucometer: One Touch ultra  - no CKD, last BUN/creatinine:  Lab Results  Component Value Date   BUN 18 07/16/2023   BUN 13 07/09/2022   CREATININE 0.91 07/16/2023   CREATININE 0.81 07/09/2022   Lab Results  Component Value Date   MICRALBCREAT 2.9 07/16/2023   MICRALBCREAT 1.1 06/04/2022   MICRALBCREAT  1.5 05/27/2021   MICRALBCREAT 1.1 08/19/2019   MICRALBCREAT 0.9 04/14/2017   MICRALBCREAT 0.5 05/30/2015   MICRALBCREAT 0.6 05/30/2014   MICRALBCREAT 0.2 05/17/2013   MICRALBCREAT 1.1 06/16/2012   MICRALBCREAT 1.1 05/25/2012  On Jardiance .  -+ HL; last set of lipids: Lab Results  Component Value Date   CHOL 92 07/16/2023   HDL 33.30 (L) 07/16/2023   LDLCALC 43 07/16/2023   LDLDIRECT 114.5 02/26/2007   TRIG 77.0 07/16/2023   CHOLHDL 3 07/16/2023  On Lipitor 20 mg daily.  - last eye exam was 05/25/2023. No DR. He did have DR in 2021. [facility].  - no numbness and tingling in his feet.  Previously saw Dr. Celia Coles, but not seeing him anymore.  Last foot exam was done by PCP 07/15/2023.  He also has a history of kidney stones, GERD, HTN, obesity. Per review of Dr. Jacqualyn Mates note, he declined weight loss surgery in the past. Cardiology investigation: LP(a) (06/11/2022) was elevated at 81.9, coronary calcium  score was also elevated (06/10/2021) at 383. He had a kidney stone 10/2022.  CT abdomen performed at that time showed aortic atherosclerosis.  ROS: + see HPI  Past Medical History:  Diagnosis Date   Acute medial meniscus tear of right knee 01/05/2018   ANEMIA DUE TO CHRONIC BLOOD LOSS 09/10/2009   Cough 07/09/2010   Qualifier: Diagnosis of   By: Washington Hacker  MD, Kaaren Ora A  HAD CHANGED FROM ACE      Feels like could be from  Cozaar also but not sure has allergy type sx last c xray 2011.     Diabetes mellitus    Elevated BP    GERD (gastroesophageal reflux disease)    HYPERLIPIDEMIA, MIXED 03/23/2007   Hypertension    KIDNEY STONE 02/05/2009   Left lower lobe pneumonia 02/08/2014   VENTRICULAR FUNCTION, DECREASED 06/21/2007   Past Surgical History:  Procedure Laterality Date   COLONOSCOPY WITH PROPOFOL  N/A 10/02/2021   Procedure: COLONOSCOPY WITH PROPOFOL ;  Surgeon: Selena Daily, MD;  Location: [facility] ENDOSCOPY;  Service: Gastroenterology;  Laterality: N/A;    ESOPHAGOGASTRODUODENOSCOPY N/A 10/02/2021   Procedure: ESOPHAGOGASTRODUODENOSCOPY (EGD);  Surgeon: Selena Daily, MD;  Location: [facility] ENDOSCOPY;  Service: Gastroenterology;  Laterality: N/A;   KNEE ARTHROSCOPY WITH MEDIAL MENISECTOMY Left 07/09/2022   Procedure: LEFT KNEE ARTHROSCOPY, DEBRIDEMENT AND PARTIAL MEDIAL MENISCECTOMY;  Surgeon: Wes Hamman, MD;  Location:  SURGERY CENTER;  Service: Orthopedics;  Laterality: Left;   Social History   Socioeconomic History   Marital status: Married    Spouse name: Not on file   Number of children: Not on file   Years of education: Not on file   Highest education level: Not on file  Occupational History    Employer: SELF-EMPLOYED    Comment: Does not work outside the home  Tobacco Use   Smoking status: Former   Smokeless tobacco: Former    Quit date: 07/28/1992  Vaping Use   Vaping status: Never Used  Substance and Sexual Activity   Alcohol use: No   Drug use: No   Sexual activity: Not on file  Other Topics Concern   Not on file  Social History Narrative   HH of 3   No pets   Firearms locked away   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    Social Drivers of Corporate investment banker Strain: Not on file  Food Insecurity: Not on file  Transportation Needs: Not on file  Physical Activity: Insufficiently Active (07/15/2023)   Exercise Vital Sign    Days of Exercise per Week: 3 days    Minutes of Exercise per Session: 40 min  Stress: No Stress Concern Present (07/15/2023)   [employer] of Occupational Health - Occupational Stress Questionnaire    Feeling of Stress : Not at all  Social Connections: Not on file  Intimate Partner Violence: Not on file   Current Outpatient Medications on File Prior to Visit  Medication Sig Dispense Refill   aspirin 81 MG tablet Take 81 mg by mouth daily.     atorvastatin  (LIPITOR) 40 MG tablet Take  0.5 tablets (20 mg total) by mouth daily. 45 tablet 3   cetirizine (ZYRTEC) 10 MG tablet Take 10 mg by mouth daily.     colesevelam  (WELCHOL ) 625 MG tablet Take 2 tablets (1,250 mg total) by mouth daily. 180 tablet 3   Continuous Glucose Sensor (FREESTYLE LIBRE 3 PLUS SENSOR) MISC 1 each by Does not apply route every 14 (fourteen) days. 6 each 3   empagliflozin  (JARDIANCE ) 25 MG TABS tablet Take 1 tablet (25 mg total) by mouth daily. 90 tablet 3   metFORMIN  (GLUCOPHAGE -XR) 500 MG 24 hr tablet Take 4 tablets (2,000 mg total) by mouth daily with breakfast. 360 tablet  3   omeprazole  (PRILOSEC) 40 MG capsule Take 1 capsule (40 mg total) by mouth daily before breakfast. 90 capsule 2   pioglitazone  (ACTOS ) 45 MG tablet Take 1 tablet (45 mg total) by mouth daily. 90 tablet 3   Saccharomyces boulardii (DAILY PROBIOTIC SUPPLEMENT PO) Take by mouth. 10 strain Probiotic (Members Conrado Delay)     tirzepatide  (MOUNJARO ) 10 MG/0.5ML Pen Inject 10 mg into the skin once a week. 6 mL 3   No current facility-administered medications on file prior to visit.   No Known Allergies Family History  Problem Relation Age of Onset   Graves' disease Mother    Heart attack Father 67       COD   Heart failure Father    Kidney disease Father    Diabetes Brother    HIV Brother    Heart attack Maternal Grandfather    Heart attack Paternal Grandfather    Heart disease Brother 66       CABG   PE: BP 122/70   Pulse 90   Ht 5\' 9"  (1.753 m)   Wt 274 lb (124.3 kg)   SpO2 95%   BMI 40.46 kg/m  Wt Readings from Last 10 Encounters:  11/19/23 274 lb (124.3 kg)  10/20/23 276 lb (125.2 kg)  08/25/23 280 lb 8 oz (127.2 kg)  08/06/23 281 lb 3.2 oz (127.6 kg)  07/15/23 275 lb (124.7 kg)  05/27/23 280 lb 6.4 oz (127.2 kg)  04/01/23 277 lb (125.6 kg)  03/23/23 277 lb 3.2 oz (125.7 kg)  11/07/22 278 lb (126.1 kg)  07/09/22 277 lb 12.5 oz (126 kg)   Constitutional: overweight, in NAD Eyes: no exophthalmos ENT: no  thyromegaly, no cervical lymphadenopathy Cardiovascular: RRR, No MRG Respiratory: CTA B Musculoskeletal: no deformities Skin: no rashes Neurological: + mild tremor with outstretched hands  ASSESSMENT: 1. DM2, non-insulin -dependent, fairly well controlled, with complications -Aortic atherosclerosis-on CT (11/18/2022)  2. HL  3.  Obesity class III  PLAN:  1. Patient with longstanding, uncontrolled, type 2 diabetes, on a complex antidiabetic regimen with metformin , TZD, bile acid sequestrant, SGLT2 inhibitor and also weekly GLP-1/GIP receptor agonist, with still poor control.  At last visit, HbA1c was higher, at 8.7%.  He did relax his diet over the holidays and he also had knee surgery and was less active but was planning to start being more active and improve diet.  In the 4 days before our last visit sugars were much better so we did not change the regimen at that time. CGM interpretation: -At today's visit, we reviewed his CGM downloads: It appears that 50% of values are in target range (goal >70%), while 50% are higher than 180 (goal <25%), and 0% are lower than 70 (goal <4%).  The calculated average blood sugar is 182.  The projected HbA1c for the next 3 months (GMI) is 7.7%. -Reviewing the CGM trends, sugars are slightly higher than before, fluctuating around the upper limit of the target range.  At this point, we discussed about adding a long-acting insulin  and he agrees to start this.  I advised him how to inject correctly, correct use of the insulin  pen, and how to titrate the dose up for effect.  We can continue the rest of the regimen for now, but at next visit I am hoping that we can try tapering down Actos  and WelChol . -I also strongly encouraged weight loss and starting exercise, which will greatly help with insulin  resistance. - I suggested to:  Patient Instructions  Please continue: - Metformin  ER 2000 mg at dinnertime  - Actos  45 mg daily in am - Welchol  625 x 2 tablets  daily - Jardiance  25 mg before breakfast - Mounjaro  10 mg weekly   Please return in 3-4 months.  - we checked his HbA1c: 8.2% (lower) - advised to check sugars at different times of the day - 4x a day, rotating check times - advised for yearly eye exams >> he is UTD - will check an ACR today - return to clinic in 3-4 months  2. HL - Lipid panel was reviewed from 06/2023: HDL low, otherwise all fractions at goal: Lab Results  Component Value Date   CHOL 92 07/16/2023   HDL 33.30 (L) 07/16/2023   LDLCALC 43 07/16/2023   LDLDIRECT 114.5 02/26/2007   TRIG 77.0 07/16/2023   CHOLHDL 3 07/16/2023  -He continues on Lipitor 20 mg daily without side effects  3.  Obesity class III - Before last visit he was back to swimming and walking and started to improve his diet -We will continue Mounjaro  at the current dose.  At last visit we discussed about increasing the Mounjaro  dose but he had eructations, which improved with probiotics.  We discussed about adding prebiotics and continuing to use Pepto-Bismol or even Mylanta if needed. - He lost 7 pounds since last visit  Emilie Harden, MD PhD [facility] Endocrinology

## 2023-11-20 ENCOUNTER — Encounter: Payer: Self-pay | Admitting: Internal Medicine

## 2023-11-20 LAB — MICROALBUMIN / CREATININE URINE RATIO
Creatinine, Urine: 47 mg/dL (ref 20–320)
Microalb Creat Ratio: 4 mg/g{creat} (ref <30)
Microalb, Ur: 0.2 mg/dL

## 2023-12-02 ENCOUNTER — Other Ambulatory Visit (HOSPITAL_BASED_OUTPATIENT_CLINIC_OR_DEPARTMENT_OTHER): Payer: Self-pay

## 2023-12-04 ENCOUNTER — Other Ambulatory Visit (HOSPITAL_BASED_OUTPATIENT_CLINIC_OR_DEPARTMENT_OTHER): Payer: Self-pay

## 2023-12-04 NOTE — Telephone Encounter (Signed)
 Pharmacy Patient Advocate Encounter  Received notification from [employer] that Prior Authorization for JArdiance  has been APPROVED through 07/27/98   PA #/Case ID/Reference #: [id number]

## 2023-12-04 NOTE — Telephone Encounter (Signed)
 Pharmacy Patient Advocate Encounter  Received notification from [employer] that Prior Authorization for Mounjaro  has been APPROVED through 08/23/24   PA #/Case ID/Reference #: [id number]

## 2023-12-10 ENCOUNTER — Encounter: Payer: Self-pay | Admitting: Internal Medicine

## 2023-12-11 ENCOUNTER — Telehealth: Payer: Self-pay

## 2023-12-11 NOTE — Telephone Encounter (Signed)
 Pharmacy Patient Advocate Encounter   Received notification from Patient Advice Request messages that prior authorization for Freestyle libre 3 plus is required/requested.   Insurance verification completed.   The patient is insured through [employer] .   Per test claim: PA required; PA submitted to above mentioned insurance via CoverMyMeds Key/confirmation #/EOC  [facility] Status is pending

## 2023-12-14 ENCOUNTER — Other Ambulatory Visit (HOSPITAL_BASED_OUTPATIENT_CLINIC_OR_DEPARTMENT_OTHER): Payer: Self-pay

## 2023-12-17 ENCOUNTER — Other Ambulatory Visit (HOSPITAL_BASED_OUTPATIENT_CLINIC_OR_DEPARTMENT_OTHER): Payer: Self-pay

## 2023-12-17 NOTE — Telephone Encounter (Signed)
 Pharmacy Patient Advocate Encounter  Received notification from [employer] that Prior Authorization for [facility] 3 plus has been APPROVED from 12/11/23 to 12/10/24   PA #/Case ID/Reference #: [id number]

## 2023-12-19 ENCOUNTER — Other Ambulatory Visit (HOSPITAL_BASED_OUTPATIENT_CLINIC_OR_DEPARTMENT_OTHER): Payer: Self-pay

## 2023-12-27 ENCOUNTER — Encounter: Payer: Self-pay | Admitting: Internal Medicine

## 2023-12-28 ENCOUNTER — Other Ambulatory Visit (HOSPITAL_BASED_OUTPATIENT_CLINIC_OR_DEPARTMENT_OTHER): Payer: Self-pay

## 2023-12-28 MED ORDER — TRESIBA FLEXTOUCH 200 UNIT/ML ~~LOC~~ SOPN
36.0000 [IU] | PEN_INJECTOR | SUBCUTANEOUS | 3 refills | Status: AC
  Filled 2023-12-28: qty 18, 100d supply, fill #0
  Filled 2023-12-28 (×2): qty 15, 83d supply, fill #0
  Filled 2024-03-15: qty 15, 83d supply, fill #1
  Filled 2024-05-20 – 2024-05-27 (×3): qty 15, 83d supply, fill #2
  Filled 2024-07-26 – 2024-08-28 (×3): qty 15, 83d supply, fill #3

## 2023-12-30 ENCOUNTER — Other Ambulatory Visit (HOSPITAL_BASED_OUTPATIENT_CLINIC_OR_DEPARTMENT_OTHER): Payer: Self-pay

## 2024-02-11 ENCOUNTER — Other Ambulatory Visit (HOSPITAL_BASED_OUTPATIENT_CLINIC_OR_DEPARTMENT_OTHER): Payer: Self-pay

## 2024-02-15 ENCOUNTER — Other Ambulatory Visit (HOSPITAL_BASED_OUTPATIENT_CLINIC_OR_DEPARTMENT_OTHER): Payer: Self-pay

## 2024-02-15 ENCOUNTER — Other Ambulatory Visit: Payer: Self-pay

## 2024-02-16 ENCOUNTER — Other Ambulatory Visit (HOSPITAL_BASED_OUTPATIENT_CLINIC_OR_DEPARTMENT_OTHER): Payer: Self-pay

## 2024-02-17 ENCOUNTER — Other Ambulatory Visit (HOSPITAL_BASED_OUTPATIENT_CLINIC_OR_DEPARTMENT_OTHER): Payer: Self-pay

## 2024-03-18 ENCOUNTER — Other Ambulatory Visit (HOSPITAL_BASED_OUTPATIENT_CLINIC_OR_DEPARTMENT_OTHER): Payer: Self-pay

## 2024-03-21 ENCOUNTER — Encounter: Payer: Self-pay | Admitting: Internal Medicine

## 2024-03-21 ENCOUNTER — Ambulatory Visit (INDEPENDENT_AMBULATORY_CARE_PROVIDER_SITE_OTHER): Admitting: Internal Medicine

## 2024-03-21 VITALS — BP 124/80 | HR 101 | Ht 69.0 in | Wt 282.0 lb

## 2024-03-21 DIAGNOSIS — E1165 Type 2 diabetes mellitus with hyperglycemia: Secondary | ICD-10-CM | POA: Diagnosis not present

## 2024-03-21 DIAGNOSIS — Z794 Long term (current) use of insulin: Secondary | ICD-10-CM

## 2024-03-21 DIAGNOSIS — Z7985 Long-term (current) use of injectable non-insulin antidiabetic drugs: Secondary | ICD-10-CM

## 2024-03-21 DIAGNOSIS — E785 Hyperlipidemia, unspecified: Secondary | ICD-10-CM

## 2024-03-21 DIAGNOSIS — Z7984 Long term (current) use of oral hypoglycemic drugs: Secondary | ICD-10-CM

## 2024-03-21 LAB — POCT GLYCOSYLATED HEMOGLOBIN (HGB A1C): Hemoglobin A1C: 6.9 % — AB (ref 4.0–5.6)

## 2024-03-21 NOTE — Progress Notes (Signed)
 Patient ID: Alfred Johnson, male   DOB: Apr 07, 1971, 53 y.o.   MRN: [id number]  HPI: Alfred Johnson is a 53 y.o.-year-old male, returning for follow-up for DM2, dx in 2008, insulin -dependent, uncontrolled, with complications (cardiovascular disease - Aortic atherosclerosis). Pt. previously saw Dr. Kassie, but last visit with me 4 months ago.  Interim history He has increased urination, but no blurry vision, no chest pain.  He had sulfur eructations - on probiotics with prn Pepto Bismol >> improved.  Reviewed HbA1c: Lab Results  Component Value Date   HGBA1C 8.2 (A) 11/19/2023   HGBA1C 8.7 (H) 07/16/2023   HGBA1C 7.4 (A) 03/23/2023   HGBA1C 7.2 (A) 11/07/2022   HGBA1C 7.7 (A) 07/08/2022   HGBA1C 7.2 (A) 03/17/2022   HGBA1C 7.1 (A) 10/07/2021   HGBA1C 7.2 (A) 06/05/2021   HGBA1C 7.7 (H) 05/27/2021   HGBA1C 7.5 (A) 02/05/2021   Pt is on a regimen of: - Metformin  ER 2000 mg in am >> at dinnertime (missing less doses - maybe 1-2 a mo) - Actos  45 mg daily in am >> at night - Welchol  625 x 2 tablets daily in am  - Jardiance  25 mg before breakfast - Ozempic  2 mg weekly - hypersensitivity to foods, nausea, occasional vomiting >> alternating 1 with 2 mg weekly >> 1 mg weekly >> Mounjaro  5 >> 10 mg weekly - Tresiba  36 units daily-added 10/2023 She previously tried Invokana  >> itching. We stopped bromocriptine  02/2022  Pt checks his sugars >4x a day with his CGM:  Previously:  Previously:  Lowest sugar was 80s >> 100 >> 84; he has hypoglycemia awareness at 90.  Highest sugar was 300s >> 200 >> 204.  Glucometer: One Touch ultra  - no CKD, last BUN/creatinine:  Lab Results  Component Value Date   BUN 18 07/16/2023   BUN 13 07/09/2022   CREATININE 0.91 07/16/2023   CREATININE 0.81 07/09/2022   Lab Results  Component Value Date   MICRALBCREAT 4 11/19/2023   MICRALBCREAT 0.4 07/02/2010   MICRALBCREAT 12.9 03/23/2007  On Jardiance .  -+ HL; last set of lipids: Lab Results   Component Value Date   CHOL 92 07/16/2023   HDL 33.30 (L) 07/16/2023   LDLCALC 43 07/16/2023   LDLDIRECT 114.5 02/26/2007   TRIG 77.0 07/16/2023   CHOLHDL 3 07/16/2023  On Lipitor 20 mg daily.  - last eye exam was 05/25/2023. No DR. He did have DR in 2021. [facility].  - no numbness and tingling in his feet.  Previously saw Dr. Magdalen, but not seeing him anymore.  Last foot exam was done by PCP 07/15/2023.  He also has a history of kidney stones, GERD, HTN, obesity. Per review of Dr. Laymond note, he declined weight loss surgery in the past. Cardiology investigation: LP(a) (06/11/2022) was elevated at 81.9, coronary calcium  score was also elevated (06/10/2021) at 383. He had a kidney stone 10/2022.  CT abdomen performed at that time showed aortic atherosclerosis.  ROS: + see HPI  Past Medical History:  Diagnosis Date   Acute medial meniscus tear of right knee 01/05/2018   ANEMIA DUE TO CHRONIC BLOOD LOSS 09/10/2009   Cough 07/09/2010   Qualifier: Diagnosis of   By: Kassie MD, Sean A  HAD CHANGED FROM ACE      Feels like could be from  Cozaar also but not sure has allergy type sx last c xray 2011.     Diabetes mellitus    Elevated BP  GERD (gastroesophageal reflux disease)    HYPERLIPIDEMIA, MIXED 03/23/2007   Hypertension    KIDNEY STONE 02/05/2009   Left lower lobe pneumonia 02/08/2014   VENTRICULAR FUNCTION, DECREASED 06/21/2007   Past Surgical History:  Procedure Laterality Date   COLONOSCOPY WITH PROPOFOL  N/A 10/02/2021   Procedure: COLONOSCOPY WITH PROPOFOL ;  Surgeon: Unk Corinn Skiff, MD;  Location[facility] ENDOSCOPY;  Service: Gastroenterology;  Laterality: N/A;   ESOPHAGOGASTRODUODENOSCOPY N/A 10/02/2021   Procedure: ESOPHAGOGASTRODUODENOSCOPY (EGD);  Surgeon: Unk Corinn Skiff, MD;  Location: [facility] ENDOSCOPY;  Service: Gastroenterology;  Laterality: N/A;   KNEE ARTHROSCOPY WITH MEDIAL MENISECTOMY Left 07/09/2022   Procedure: LEFT KNEE ARTHROSCOPY, DEBRIDEMENT  AND PARTIAL MEDIAL MENISCECTOMY;  Surgeon: Jerri Kay HERO, MD;  Location: [facility];  Service: Orthopedics;  Laterality: Left;   Social History   Socioeconomic History   Marital status: Married    Spouse name: Not on file   Number of children: Not on file   Years of education: Not on file   Highest education level: Not on file  Occupational History    Employer: SELF-EMPLOYED    Comment: Does not work outside the home  Tobacco Use   Smoking status: Former   Smokeless tobacco: Former    Quit date: 07/28/1992  Vaping Use   Vaping status: Never Used  Substance and Sexual Activity   Alcohol use: No   Drug use: No   Sexual activity: Not on file  Other Topics Concern   Not on file  Social History Narrative   HH of 3   No pets   Firearms locked away   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    Social Drivers of Corporate investment banker Strain: Not on file  Food Insecurity: Not on file  Transportation Needs: Not on file  Physical Activity: Insufficiently Active (07/15/2023)   Exercise Vital Sign    Days of Exercise per Week: 3 days    Minutes of Exercise per Session: 40 min  Stress: No Stress Concern Present (07/15/2023)   [employer] of Occupational Health - Occupational Stress Questionnaire    Feeling of Stress : Not at all  Social Connections: Not on file  Intimate Partner Violence: Not on file   Current Outpatient Medications on File Prior to Visit  Medication Sig Dispense Refill   aspirin 81 MG tablet Take 81 mg by mouth daily.     atorvastatin  (LIPITOR) 40 MG tablet Take 0.5 tablets (20 mg total) by mouth daily. 45 tablet 3   cetirizine (ZYRTEC) 10 MG tablet Take 10 mg by mouth daily.     colesevelam  (WELCHOL ) 625 MG tablet Take 2 tablets (1,250 mg total) by mouth daily. 180 tablet 3   Continuous Glucose Sensor (FREESTYLE LIBRE 3 PLUS SENSOR) MISC 1 each by Does not  apply route every 14 (fourteen) days. 6 each 3   empagliflozin  (JARDIANCE ) 25 MG TABS tablet Take 1 tablet (25 mg total) by mouth daily. 90 tablet 3   insulin  degludec (TRESIBA  FLEXTOUCH) 200 UNIT/ML FlexTouch Pen Inject 36 Units into the skin daily. 18 mL 3   Insulin  Pen Needle 32G X 4 MM MISC Use once daily. 100 each 3   metFORMIN  (GLUCOPHAGE -XR) 500 MG 24 hr tablet Take 4 tablets (2,000 mg total) by mouth daily with breakfast. 360 tablet 3   omeprazole  (PRILOSEC) 40 MG capsule Take 1  capsule (40 mg total) by mouth daily before breakfast. 90 capsule 2   pioglitazone  (ACTOS ) 45 MG tablet Take 1 tablet (45 mg total) by mouth daily. 90 tablet 3   Saccharomyces boulardii (DAILY PROBIOTIC SUPPLEMENT PO) Take by mouth. 10 strain Probiotic (Members Mark Brand)     tirzepatide  (MOUNJARO ) 10 MG/0.5ML Pen Inject 10 mg into the skin once a week. 6 mL 3   No current facility-administered medications on file prior to visit.   No Known Allergies Family History  Problem Relation Age of Onset   Graves' disease Mother    Heart attack Father 35       COD   Heart failure Father    Kidney disease Father    Diabetes Brother    HIV Brother    Heart attack Maternal Grandfather    Heart attack Paternal Grandfather    Heart disease Brother 71       CABG   PE: BP 124/80   Pulse (!) 101   Ht 5' 9 (1.753 m)   Wt 282 lb (127.9 kg)   SpO2 97%   BMI 41.64 kg/m  Wt Readings from Last 10 Encounters:  03/21/24 282 lb (127.9 kg)  11/19/23 274 lb (124.3 kg)  10/20/23 276 lb (125.2 kg)  08/25/23 280 lb 8 oz (127.2 kg)  08/06/23 281 lb 3.2 oz (127.6 kg)  07/15/23 275 lb (124.7 kg)  05/27/23 280 lb 6.4 oz (127.2 kg)  04/01/23 277 lb (125.6 kg)  03/23/23 277 lb 3.2 oz (125.7 kg)  11/07/22 278 lb (126.1 kg)   Constitutional: overweight, in NAD Eyes: no exophthalmos ENT: no thyromegaly, no cervical lymphadenopathy Cardiovascular: Tachycardia, RR, No MRG Respiratory: CTA B Musculoskeletal: no  deformities Skin: no rashes Neurological: + mild tremor with outstretched hands Diabetic Foot Exam - Simple   Simple Foot Form Diabetic Foot exam was performed with the following findings: Yes 03/21/2024  8:43 AM  Visual Inspection No deformities, no ulcerations, no other skin breakdown bilaterally: Yes Sensation Testing Intact to touch and monofilament testing bilaterally: Yes Pulse Check Posterior Tibialis and Dorsalis pulse intact bilaterally: Yes Comments    ASSESSMENT: 1. DM2,insulin -dependent, uncontrolled, with complications - Aortic atherosclerosis-on CT (11/18/2022)  2. HL  3.  Obesity class III  PLAN:  1. Patient with longstanding, uncontrolled, type 2 diabetes, on complex antidiabetic regimen with metformin , TZD, bile acid sequestrant, SGLT2 inhibitor and also long-acting insulin  and weekly injectable GLP-1/GIP receptor agonist, with still suboptimal control.  At last visit HbA1c was better, at 8.2%, but still above target.  Sugars were slightly higher than before, fluctuating around the upper limit of the target range.  I recommended adding long-acting insulin .  I also strongly encouraged weight loss and starting exercise to improve his insulin  resistance CGM interpretation: -At today's visit, we reviewed his CGM downloads: It appears that 97% of values are in target range (goal >70%), while 3% are higher than 180 (goal <25%), and 0% are lower than 70 (goal <4%).  The calculated average blood sugar is 134.  The projected HbA1c for the next 3 months (GMI) is 6.5%. -Reviewing the CGM trends, sugars have improved significantly since last visit, now fluctuating within the target range, with only very mild and very occasional higher blood sugars especially after breakfast and dinner.  His sugars are still increasing overnight and peaking after his coffee with creamer.  We discussed about starting creamer and replacing it with almond milk.  Also, we discussed about other ways to  counteract  his dawn phenomenon/increased nocturnal hepatic gluconeogenesis, including lighter dinners, fewer proteins with dinner, increasing exercise, weight loss.   -At today's visit, we discussed about starting to de-escalate his regimen.  I suggested to decrease the Actos  dose in half, but otherwise to continue the rest of the regimen. - I suggested to:  Patient Instructions  Please continue: - Metformin  ER 2000 mg at dinnertime  - Welchol  625 x 2 tablets daily - Jardiance  25 mg before breakfast - Mounjaro  10 mg weekly  - Tresiba  36 units daily  Please decrease: - Actos  22.5 mg daily (half of a 45 mg tablet)   Please return in 3-4 months.  - we checked his HbA1c: 6.9% (lowest in a long time) - advised to check sugars at different times of the day - 4x a day, rotating check times - advised for yearly eye exams >> he is UTD - return to clinic in 3-4 months  2. HL - Latest lipid panel from 06/2023 showed LDL and triglycerides at goal, HDL slightly low: Lab Results  Component Value Date   CHOL 92 07/16/2023   HDL 33.30 (L) 07/16/2023   LDLCALC 43 07/16/2023   LDLDIRECT 114.5 02/26/2007   TRIG 77.0 07/16/2023   CHOLHDL 3 07/16/2023  -He continues on Lipitor 20 mg daily and WelChol  1250 mg without side effects  3.  Obesity class III - Before last visit he was back to swimming and walking and started to improve his diet - He continues on Mounjaro .  We tried to increase the Mounjaro  dose but he had eructations, which improved with probiotics.  We discussed about adding prebiotics and continuing to use Pepto-Bismol or even Mylanta if needed. - Decreasing Actos  dose will likely also help with weight loss - He lost 7 pounds before last visit but gained 8 pounds since then  Lela Fendt, MD PhD [facility] Endocrinology

## 2024-03-21 NOTE — Patient Instructions (Addendum)
 Please continue: - Metformin  ER 2000 mg at dinnertime  - Welchol  625 x 2 tablets daily - Jardiance  25 mg before breakfast - Mounjaro  10 mg weekly  - Tresiba  36 units daily  Please decrease: - Actos  22.5 mg daily (half of a 45 mg tablet)   Please return in 3-4 months.

## 2024-03-25 ENCOUNTER — Other Ambulatory Visit (HOSPITAL_BASED_OUTPATIENT_CLINIC_OR_DEPARTMENT_OTHER): Payer: Self-pay

## 2024-03-31 ENCOUNTER — Ambulatory Visit: Payer: Self-pay

## 2024-03-31 NOTE — Telephone Encounter (Signed)
 FYI Only or Action Required?: Action required by provider: clinical question for provider.  Patient was last seen in primary care on 07/15/2023 by Panosh, Apolinar POUR, MD.  Called Nurse Triage reporting Covid Exposure.   Triage Disposition: Call PCP Within 24 Hours  Patient/caregiver understands and will follow disposition?: Yes       Reason for Disposition  [1] COVID-19 EXPOSURE within last 14 days AND [2] weak immune system (e.g., HIV positive, cancer chemo, splenectomy, organ transplant, chronic steroids) AND [3] NO symptoms  Additional Information  Commented on: Answer Assessment    Pt wife tested + COVID at home. Pt is currently asymptomatic, but does endorse having diabetes and unsure if he needs treatment.  [facility] will forward encounter for Dr Charlett 's office to review and advise. Caregiver verbalized understanding and is expecting call back from office for next steps.  Protocols used: Coronavirus (COVID-19) Exposure-A-AH

## 2024-04-01 NOTE — Telephone Encounter (Signed)
 Reach out to Alfred Johnson. Alfred Johnson reports he has exposure to covid and has no symptoms. Alfred Johnson states he is not looking for appt but requesting for paxlovid. Alfred Johnson express his disappointment with [facility]. Alfred Johnson states he thought he could get Rx for continuation of care like his friends with their PCP. He states our service had fail them few times. He recall a time when his son was sick and didn't get his tamiflu within timeframe. This time, he reports his wife had called in yesterday and didn't get call until this morning and now she passed 48 hrs for her med for covid. Alfred Johnson apologize as this is not directly toward this cma. He continues that the only thing that keep him stay with [facility] is his endocrine provider. Alfred Johnson mention about looking for different PCP.   Offer Alfred Johnson to speak with our manager. Alfred Johnson states that would be fine.

## 2024-04-01 NOTE — Telephone Encounter (Signed)
 Spoke with patient regarding his care. Patient voiced he was unhappy with [facility] specifically. Patient states he likes his PCP and our nursing staff. Patient states he thinks facility only wants money for [facility] for visits. Explained to patient that Dr. Charlett is not in office today and the providers cannot prescribed medications without seeing any patient. Gave the patient the options of doing a virtual visit with a provider at [facility] since [facility] schedule has no availability or patient could do an E-visit through his MyChart. Patient declines making appointment at this time with [facility]. Patient was thankful for the information. Expressed my concern for his care and apologized his matters were not taken care of. Patient voiced understanding. He will return call if he would like to schedule an appointment.

## 2024-04-09 ENCOUNTER — Other Ambulatory Visit (HOSPITAL_BASED_OUTPATIENT_CLINIC_OR_DEPARTMENT_OTHER): Payer: Self-pay

## 2024-04-09 MED ORDER — FLUZONE 0.5 ML IM SUSY
0.5000 mL | PREFILLED_SYRINGE | Freq: Once | INTRAMUSCULAR | 0 refills | Status: AC
  Filled 2024-04-09: qty 0.5, 1d supply, fill #0

## 2024-04-15 ENCOUNTER — Other Ambulatory Visit (HOSPITAL_BASED_OUTPATIENT_CLINIC_OR_DEPARTMENT_OTHER): Payer: Self-pay

## 2024-04-15 MED ORDER — COMIRNATY 30 MCG/0.3ML IM SUSY
0.3000 mL | PREFILLED_SYRINGE | Freq: Once | INTRAMUSCULAR | 0 refills | Status: AC
  Filled 2024-04-15: qty 0.3, 1d supply, fill #0

## 2024-04-20 ENCOUNTER — Other Ambulatory Visit (HOSPITAL_BASED_OUTPATIENT_CLINIC_OR_DEPARTMENT_OTHER): Payer: Self-pay

## 2024-05-05 ENCOUNTER — Other Ambulatory Visit (HOSPITAL_BASED_OUTPATIENT_CLINIC_OR_DEPARTMENT_OTHER): Payer: Self-pay

## 2024-05-05 ENCOUNTER — Other Ambulatory Visit: Payer: Self-pay

## 2024-05-06 ENCOUNTER — Other Ambulatory Visit (HOSPITAL_BASED_OUTPATIENT_CLINIC_OR_DEPARTMENT_OTHER): Payer: Self-pay

## 2024-05-10 ENCOUNTER — Other Ambulatory Visit (HOSPITAL_BASED_OUTPATIENT_CLINIC_OR_DEPARTMENT_OTHER): Payer: Self-pay

## 2024-05-13 ENCOUNTER — Other Ambulatory Visit (HOSPITAL_BASED_OUTPATIENT_CLINIC_OR_DEPARTMENT_OTHER): Payer: Self-pay

## 2024-05-20 ENCOUNTER — Other Ambulatory Visit (HOSPITAL_BASED_OUTPATIENT_CLINIC_OR_DEPARTMENT_OTHER): Payer: Self-pay

## 2024-05-30 ENCOUNTER — Encounter: Payer: Self-pay | Admitting: Radiology

## 2024-05-30 LAB — OPHTHALMOLOGY REPORT-SCANNED

## 2024-06-12 ENCOUNTER — Other Ambulatory Visit: Payer: Self-pay | Admitting: Internal Medicine

## 2024-06-12 DIAGNOSIS — E785 Hyperlipidemia, unspecified: Secondary | ICD-10-CM

## 2024-06-14 ENCOUNTER — Other Ambulatory Visit (HOSPITAL_BASED_OUTPATIENT_CLINIC_OR_DEPARTMENT_OTHER): Payer: Self-pay

## 2024-06-14 MED ORDER — ATORVASTATIN CALCIUM 40 MG PO TABS
20.0000 mg | ORAL_TABLET | Freq: Every day | ORAL | 1 refills | Status: AC
  Filled 2024-06-14: qty 45, 90d supply, fill #0

## 2024-06-16 ENCOUNTER — Other Ambulatory Visit: Payer: Self-pay | Admitting: Gastroenterology

## 2024-06-17 ENCOUNTER — Other Ambulatory Visit (HOSPITAL_BASED_OUTPATIENT_CLINIC_OR_DEPARTMENT_OTHER): Payer: Self-pay

## 2024-07-04 NOTE — Progress Notes (Unsigned)
 Patient ID: Alfred Johnson, male   DOB: 02-Jan-1971, 53 y.o.   MRN: [id number]  HPI: Alfred Johnson is a 53 y.o.-year-old male, returning for follow-up for DM2, dx in 2008, insulin -dependent, uncontrolled, with complications (cardiovascular disease - Aortic atherosclerosis, DR). Pt. previously saw Dr. Kassie, but last visit with me 4 months ago.  Interim history He has increased urination, but no blurry vision, no chest pain.  He had sulfur eructations - on probiotics with prn Pepto Bismol >> improved.  Reviewed HbA1c: Lab Results  Component Value Date   HGBA1C 6.9 (A) 03/21/2024   HGBA1C 8.2 (A) 11/19/2023   HGBA1C 8.7 (H) 07/16/2023   HGBA1C 7.4 (A) 03/23/2023   HGBA1C 7.2 (A) 11/07/2022   HGBA1C 7.7 (A) 07/08/2022   HGBA1C 7.2 (A) 03/17/2022   HGBA1C 7.1 (A) 10/07/2021   HGBA1C 7.2 (A) 06/05/2021   HGBA1C 7.7 (H) 05/27/2021   Pt is on a regimen of: - Metformin  ER 2000 mg in am >> at dinnertime (missing less doses - maybe 1-2 a mo) - Actos  45 mg daily in am >> at night >> 22.5 mg daily - Welchol  625 x 2 tablets daily in am  - Jardiance  25 mg before breakfast - Ozempic  2 mg weekly - hypersensitivity to foods, nausea, occasional vomiting >> alternating 1 with 2 mg weekly >> 1 mg weekly >> Mounjaro  5 >> 10 mg weekly - Tresiba  36 units daily-added 10/2023 She previously tried Invokana  >> itching. We stopped bromocriptine  02/2022  Pt checks his sugars >4x a day with his CGM:  Previously:  Previously:   Lowest sugar was 80s >> 100 >> 84; he has hypoglycemia awareness at 90.  Highest sugar was 300s >> 200 >> 204.  Glucometer: One Touch ultra  - no CKD, last BUN/creatinine:  Lab Results  Component Value Date   BUN 18 07/16/2023   BUN 13 07/09/2022   CREATININE 0.91 07/16/2023   CREATININE 0.81 07/09/2022   Lab Results  Component Value Date   MICRALBCREAT 4 11/19/2023   MICRALBCREAT 0.4 07/02/2010   MICRALBCREAT 12.9 03/23/2007  On Jardiance .  -+ HL; last set  of lipids: Lab Results  Component Value Date   CHOL 92 07/16/2023   HDL 33.30 (L) 07/16/2023   LDLCALC 43 07/16/2023   LDLDIRECT 114.5 02/26/2007   TRIG 77.0 07/16/2023   CHOLHDL 3 07/16/2023  On Lipitor 20 mg daily.  - last eye exam was on 05/30/2024. + DR. He did have DR in 2021. [facility]  - no numbness and tingling in his feet.  Previously saw Dr. Magdalen, but not seeing him anymore.  Last foot exam was done here in clinic in 02/2024  He also has a history of kidney stones, GERD, HTN, obesity. Per review of Dr. Laymond note, he declined weight loss surgery in the past. Cardiology investigation: LP(a) (06/11/2022) was elevated at 81.9, coronary calcium  score was also elevated (06/10/2021) at 383. He had a kidney stone 10/2022.  CT abdomen performed at that time showed aortic atherosclerosis.  ROS: + see HPI  Past Medical History:  Diagnosis Date   Acute medial meniscus tear of right knee 01/05/2018   ANEMIA DUE TO CHRONIC BLOOD LOSS 09/10/2009   Cough 07/09/2010   Qualifier: Diagnosis of   By: Kassie MD, Sean A  HAD CHANGED FROM ACE      Feels like could be from  Cozaar also but not sure has allergy type sx last c xray 2011.     Diabetes  mellitus    Elevated BP    GERD (gastroesophageal reflux disease)    HYPERLIPIDEMIA, MIXED 03/23/2007   Hypertension    KIDNEY STONE 02/05/2009   Left lower lobe pneumonia 02/08/2014   VENTRICULAR FUNCTION, DECREASED 06/21/2007   Past Surgical History:  Procedure Laterality Date   COLONOSCOPY WITH PROPOFOL  N/A 10/02/2021   Procedure: COLONOSCOPY WITH PROPOFOL ;  Surgeon: Unk Corinn Skiff, MD;  Location: [facility] ENDOSCOPY;  Service: Gastroenterology;  Laterality: N/A;   ESOPHAGOGASTRODUODENOSCOPY N/A 10/02/2021   Procedure: ESOPHAGOGASTRODUODENOSCOPY (EGD);  Surgeon: Unk Corinn Skiff, MD;  Location: [facility] ENDOSCOPY;  Service: Gastroenterology;  Laterality: N/A;   KNEE ARTHROSCOPY WITH MEDIAL MENISECTOMY Left 07/09/2022   Procedure:  LEFT KNEE ARTHROSCOPY, DEBRIDEMENT AND PARTIAL MEDIAL MENISCECTOMY;  Surgeon: Jerri Kay HERO, MD;  Location:  SURGERY CENTER;  Service: Orthopedics;  Laterality: Left;   Social History   Socioeconomic History   Marital status: Married    Spouse name: Not on file   Number of children: Not on file   Years of education: Not on file   Highest education level: Not on file  Occupational History    Employer: SELF-EMPLOYED    Comment: Does not work outside the home  Tobacco Use   Smoking status: Former   Smokeless tobacco: Former    Quit date: 07/28/1992  Vaping Use   Vaping status: Never Used  Substance and Sexual Activity   Alcohol use: No   Drug use: No   Sexual activity: Not on file  Other Topics Concern   Not on file  Social History Narrative   HH of 3   No pets   Firearms locked away   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    Social Drivers of Corporate Investment Banker Strain: Not on file  Food Insecurity: Not on file  Transportation Needs: Not on file  Physical Activity: Insufficiently Active (07/15/2023)   Exercise Vital Sign    Days of Exercise per Week: 3 days    Minutes of Exercise per Session: 40 min  Stress: No Stress Concern Present (07/15/2023)   [employer] of Occupational Health - Occupational Stress Questionnaire    Feeling of Stress : Not at all  Social Connections: Not on file  Intimate Partner Violence: Not on file   Current Outpatient Medications on File Prior to Visit  Medication Sig Dispense Refill   aspirin 81 MG tablet Take 81 mg by mouth daily.     atorvastatin  (LIPITOR) 40 MG tablet Take 0.5 tablets (20 mg total) by mouth daily. 45 tablet 1   cetirizine (ZYRTEC) 10 MG tablet Take 10 mg by mouth daily.     colesevelam  (WELCHOL ) 625 MG tablet Take 2 tablets (1,250 mg total) by mouth daily. 180 tablet 3   Continuous Glucose Sensor (FREESTYLE LIBRE 3  PLUS SENSOR) MISC 1 each by Does not apply route every 14 (fourteen) days. 6 each 3   empagliflozin  (JARDIANCE ) 25 MG TABS tablet Take 1 tablet (25 mg total) by mouth daily. 90 tablet 3   insulin  degludec (TRESIBA  FLEXTOUCH) 200 UNIT/ML FlexTouch Pen Inject 36 Units into the skin daily. 18 mL 3   Insulin  Pen Needle 32G X 4 MM MISC Use once daily. 100 each 3   metFORMIN  (GLUCOPHAGE -XR) 500 MG 24 hr tablet Take 4 tablets (2,000 mg total) by mouth daily with breakfast. 360 tablet 3  omeprazole  (PRILOSEC) 40 MG capsule Take 1 capsule (40 mg total) by mouth daily before breakfast. 90 capsule 2   pioglitazone  (ACTOS ) 45 MG tablet Take 1 tablet (45 mg total) by mouth daily. 90 tablet 3   Saccharomyces boulardii (DAILY PROBIOTIC SUPPLEMENT PO) Take by mouth. 10 strain Probiotic ([employer])     tirzepatide  (MOUNJARO ) 10 MG/0.5ML Pen Inject 10 mg into the skin once a week. 6 mL 3   No current facility-administered medications on file prior to visit.   No Known Allergies Family History  Problem Relation Age of Onset   Graves' disease Mother    Heart attack Father 65       COD   Heart failure Father    Kidney disease Father    Diabetes Brother    HIV Brother    Heart attack Maternal Grandfather    Heart attack Paternal Grandfather    Heart disease Brother 5       CABG   PE: There were no vitals taken for this visit. Wt Readings from Last 10 Encounters:  03/21/24 282 lb (127.9 kg)  11/19/23 274 lb (124.3 kg)  10/20/23 276 lb (125.2 kg)  08/25/23 280 lb 8 oz (127.2 kg)  08/06/23 281 lb 3.2 oz (127.6 kg)  07/15/23 275 lb (124.7 kg)  05/27/23 280 lb 6.4 oz (127.2 kg)  04/01/23 277 lb (125.6 kg)  03/23/23 277 lb 3.2 oz (125.7 kg)  11/07/22 278 lb (126.1 kg)   Constitutional: overweight, in NAD Eyes: no exophthalmos ENT: no thyromegaly, no cervical lymphadenopathy Cardiovascular: Tachycardia, RR, No MRG Respiratory: CTA B Musculoskeletal: no deformities Skin: no  rashes Neurological: + mild tremor with outstretched hands   ASSESSMENT: 1. DM2,insulin -dependent, uncontrolled, with complications - Aortic atherosclerosis-on CT (11/18/2022) - DR  2. HL  3.  Obesity class III  PLAN:  1. Patient with longstanding, uncontrolled, type 2 diabetes, on complex antidiabetic regimen with metformin , TZD, bile acid sequestrant, SGLT2 inhibitor and also long-acting insulin  and weekly injectable GLP-1/GIP receptor agonist, with still suboptimal control.  At last visit, HbA1c improved to 6.9%, placed in many years.  We decreased his Actos  dose as sugars appear to be significantly improved, fluctuating within the target range, with only very mild and very occasional higher blood sugars especially after breakfast and dinner.  Sugars were increasing overnight and peaking after his coffee with creamer.  We discussed about stopping the creamer and replacing it with almond milk.  We also discussed about other ways to counteract his dawn phenomenon/nocturnal hepatic gluconeogenesis including lighter dinners, fewer proteins with dinner, increasing exercise, weight loss. CGM interpretation: -At today's visit, we reviewed his CGM downloads: It appears that *** of values are in target range (goal >70%), while *** are higher than 180 (goal <25%), and *** are lower than 70 (goal <4%).  The calculated average blood sugar is ***.  The projected HbA1c for the next 3 months (GMI) is ***. -Reviewing the CGM trends, ***  - I suggested to:  Patient Instructions  Please continue: - Metformin  ER 2000 mg at dinnertime  - Welchol  625 x 2 tablets daily - Jardiance  25 mg before breakfast - Mounjaro  10 mg weekly  - Tresiba  36 units daily - Actos  22.5 mg daily (half of a 45 mg tablet)   Please return in 3-4 months.  - we checked his HbA1c: 7%  - advised to check sugars at different times of the day - 4x a day, rotating check times - advised for yearly  eye exams >> he is UTD - return to  clinic in 3-4 months  2. HL - Latest lipid panel was reviewed from 06/2023: LDL at goal, triglycerides also at goal, HDL slightly low: Lab Results  Component Value Date   CHOL 92 07/16/2023   HDL 33.30 (L) 07/16/2023   LDLCALC 43 07/16/2023   LDLDIRECT 114.5 02/26/2007   TRIG 77.0 07/16/2023   CHOLHDL 3 07/16/2023  - He continues on Lipitor 20 mg daily and WelChol  1250 mg daily without side effects  3.  Obesity class III - Was previously swimming and walking and started to improve diet - He continues on Mounjaro .  We tried to increase the Mounjaro  dose but he had eructations, which improved with probiotics.  We discussed about adding prebiotics and continuing to use Pepto-Bismol or even Mylanta if needed. - Decreasing Actos  dose will likely also help with weight loss - He gained 8 pounds before last visit  Lela Fendt, MD PhD [facility] Endocrinology

## 2024-07-05 ENCOUNTER — Encounter: Payer: Self-pay | Admitting: Internal Medicine

## 2024-07-05 ENCOUNTER — Other Ambulatory Visit

## 2024-07-05 ENCOUNTER — Other Ambulatory Visit: Payer: Self-pay

## 2024-07-05 ENCOUNTER — Other Ambulatory Visit (HOSPITAL_BASED_OUTPATIENT_CLINIC_OR_DEPARTMENT_OTHER): Payer: Self-pay

## 2024-07-05 ENCOUNTER — Ambulatory Visit: Admitting: Internal Medicine

## 2024-07-05 VITALS — BP 122/70 | HR 97 | Ht 69.0 in | Wt 287.6 lb

## 2024-07-05 DIAGNOSIS — E785 Hyperlipidemia, unspecified: Secondary | ICD-10-CM

## 2024-07-05 DIAGNOSIS — E1165 Type 2 diabetes mellitus with hyperglycemia: Secondary | ICD-10-CM

## 2024-07-05 DIAGNOSIS — E119 Type 2 diabetes mellitus without complications: Secondary | ICD-10-CM

## 2024-07-05 LAB — COMPREHENSIVE METABOLIC PANEL WITH GFR
AG Ratio: 1.7 (calc) (ref 1.0–2.5)
ALT: 20 U/L (ref 9–46)
AST: 14 U/L (ref 10–35)
Albumin: 4.6 g/dL (ref 3.6–5.1)
Alkaline phosphatase (APISO): 90 U/L (ref 35–144)
BUN: 22 mg/dL (ref 7–25)
CO2: 25 mmol/L (ref 20–32)
Calcium: 9.8 mg/dL (ref 8.6–10.3)
Chloride: 104 mmol/L (ref 98–110)
Creat: 0.96 mg/dL (ref 0.70–1.30)
Globulin: 2.7 g/dL (ref 1.9–3.7)
Glucose, Bld: 131 mg/dL — ABNORMAL HIGH (ref 65–99)
Potassium: 5 mmol/L (ref 3.5–5.3)
Sodium: 139 mmol/L (ref 135–146)
Total Bilirubin: 0.4 mg/dL (ref 0.2–1.2)
Total Protein: 7.3 g/dL (ref 6.1–8.1)
eGFR: 93 mL/min/1.73m2 (ref >=60)

## 2024-07-05 LAB — LIPID PANEL W/REFLEX DIRECT LDL
Cholesterol: 108 mg/dL (ref <200)
HDL: 42 mg/dL (ref >=40)
LDL Cholesterol (Calc): 48 mg/dL
Non-HDL Cholesterol (Calc): 66 mg/dL (ref <130)
Total CHOL/HDL Ratio: 2.6 (calc) (ref <5.0)
Triglycerides: 96 mg/dL (ref <150)

## 2024-07-05 LAB — POCT GLYCOSYLATED HEMOGLOBIN (HGB A1C): Hemoglobin A1C: 6.9 % — AB (ref 4.0–5.6)

## 2024-07-05 MED ORDER — METFORMIN HCL ER 500 MG PO TB24
2000.0000 mg | ORAL_TABLET | Freq: Every day | ORAL | 3 refills | Status: AC
  Filled 2024-07-05 (×2): qty 360, 90d supply, fill #0

## 2024-07-05 MED ORDER — FREESTYLE LIBRE 3 PLUS SENSOR MISC
1.0000 | 3 refills | Status: AC
  Filled 2024-07-05: qty 6, 84d supply, fill #0

## 2024-07-05 MED ORDER — PIOGLITAZONE HCL 45 MG PO TABS: 22.5000 mg | ORAL_TABLET | Freq: Every day | ORAL | Status: AC

## 2024-07-05 MED ORDER — COLESEVELAM HCL 625 MG PO TABS
1250.0000 mg | ORAL_TABLET | Freq: Every day | ORAL | 3 refills | Status: AC
  Filled 2024-07-05 – 2024-08-08 (×3): qty 180, 90d supply, fill #0

## 2024-07-05 MED ORDER — TIRZEPATIDE 10 MG/0.5ML ~~LOC~~ SOAJ
10.0000 mg | SUBCUTANEOUS | 3 refills | Status: AC
  Filled 2024-07-05 – 2024-07-17 (×3): qty 6, 84d supply, fill #0

## 2024-07-05 MED ORDER — EMPAGLIFLOZIN 25 MG PO TABS
25.0000 mg | ORAL_TABLET | Freq: Every day | ORAL | 3 refills | Status: AC
  Filled 2024-07-05 – 2024-08-08 (×3): qty 90, 90d supply, fill #0

## 2024-07-05 NOTE — Patient Instructions (Addendum)
 Please continue: - Metformin  ER 2000 mg - move this at dinnertime  - Welchol  625 x 2 capsule daily - Jardiance  25 mg before breakfast - Mounjaro  10 mg weekly  - Tresiba  36 units daily  Try to stop Actos  after the Holidays.   Please return in 3-4 months.

## 2024-07-17 ENCOUNTER — Other Ambulatory Visit: Payer: Self-pay | Admitting: Gastroenterology

## 2024-07-18 ENCOUNTER — Other Ambulatory Visit: Payer: Self-pay

## 2024-07-18 ENCOUNTER — Other Ambulatory Visit (HOSPITAL_BASED_OUTPATIENT_CLINIC_OR_DEPARTMENT_OTHER): Payer: Self-pay

## 2024-07-20 ENCOUNTER — Encounter: Payer: Self-pay | Admitting: Internal Medicine

## 2024-07-20 ENCOUNTER — Ambulatory Visit: Admitting: Internal Medicine

## 2024-07-20 VITALS — BP 120/78 | HR 105 | Temp 97.8°F | Ht 68.31 in | Wt 283.2 lb

## 2024-07-20 DIAGNOSIS — E782 Mixed hyperlipidemia: Secondary | ICD-10-CM

## 2024-07-20 DIAGNOSIS — I10 Essential (primary) hypertension: Secondary | ICD-10-CM | POA: Diagnosis not present

## 2024-07-20 DIAGNOSIS — Z0000 Encounter for general adult medical examination without abnormal findings: Secondary | ICD-10-CM

## 2024-07-20 DIAGNOSIS — E1165 Type 2 diabetes mellitus with hyperglycemia: Secondary | ICD-10-CM

## 2024-07-20 NOTE — Progress Notes (Signed)
 "  Chief Complaint  Patient presents with   Annual Exam    HPI: Patient  Alfred Johnson  53 y.o. comes in today for Preventive Health Care visit   Update hx  See endo dm   controlled  but  glp gip hasn't help with weight at all.  Hld control  had labs a few weeks ago   Probiotics have helped gi sx  Urinary frequency from  jardiacne no chagne   Health Maintenance  Topic Date Due   Pneumococcal Vaccine: 50+ Years (3 of 3 - PCV20 or PCV21) 08/18/2024   COVID-19 Vaccine (6 - Pfizer risk 2025-26 season) 10/13/2024   Diabetic kidney evaluation - Urine ACR  11/18/2024   HEMOGLOBIN A1C  01/03/2025   OPHTHALMOLOGY EXAM  05/30/2025   DTaP/Tdap/Td (3 - Td or Tdap) 06/07/2025   Diabetic kidney evaluation - eGFR measurement  07/05/2025   FOOT EXAM  07/20/2025   Colonoscopy  10/03/2026   Influenza Vaccine  Completed   Hepatitis B Vaccines 19-59 Average Risk  Completed   Hepatitis C Screening  Completed   HIV Screening  Completed   Zoster Vaccines- Shingrix  Completed   HPV VACCINES  Aged Out   Meningococcal B Vaccine  Aged Out   Health Maintenance Review LIFESTYLE:  Exercise:   walking   delayed weights  from elbow tendinitis flare  Tobacco/ETS: n Alcohol:   Sugar beverages: n Sleep:7-8 hours  Drug use: no HH of 3  son going to  college   [place] in [place]  3 year program.    ROS:  GEN/ HEENT: No fever, significant weight changes sweats headaches vision problems hearing changes, CV/ PULM; No chest pain shortness of breath cough, syncope,edema  change in exercise tolerance. GI /GU: No adominal pain, vomiting, change in bowel habits. No blood in the stool. No significant GU symptoms. SKIN/HEME: ,no acute skin rashes suspicious lesions or bleeding. No lymphadenopathy, nodules, masses.  NEURO/ PSYCH:  No neurologic signs such as weakness numbness. No depression anxiety. IMM/ Allergy: No unusual infections.  Allergy .   REST of 12 system review negative except as per HPI   Past  Medical History:  Diagnosis Date   Acute medial meniscus tear of right knee 01/05/2018   ANEMIA DUE TO CHRONIC BLOOD LOSS 09/10/2009   Cough 07/09/2010   Qualifier: Diagnosis of   By: Kassie MD, Sean A  HAD CHANGED FROM ACE      Feels like could be from  Cozaar also but not sure has allergy type sx last c xray 2011.     Diabetes mellitus    Elevated BP    GERD (gastroesophageal reflux disease)    HYPERLIPIDEMIA, MIXED 03/23/2007   Hypertension    KIDNEY STONE 02/05/2009   Left lower lobe pneumonia 02/08/2014   VENTRICULAR FUNCTION, DECREASED 06/21/2007    Past Surgical History:  Procedure Laterality Date   COLONOSCOPY WITH PROPOFOL  N/A 10/02/2021   Procedure: COLONOSCOPY WITH PROPOFOL ;  Surgeon: Unk Corinn Skiff, MD;  Location: [facility] ENDOSCOPY;  Service: Gastroenterology;  Laterality: N/A;   ESOPHAGOGASTRODUODENOSCOPY N/A 10/02/2021   Procedure: ESOPHAGOGASTRODUODENOSCOPY (EGD);  Surgeon: Unk Corinn Skiff, MD;  Location: [facility] END[facility]COPY;  Service: Gastroenterology;  Laterality: N/A;   KNEE ARTHROSCOPY WITH MEDIAL MENISECTOMY Left 07/09/2022   Procedure: LEFT KNEE ARTHROSCOPY, DEBRIDEMENT AND PARTIAL MEDIAL MENISCECTOMY;  Surgeon: Jerri Kay HERO, MD;  Location: [facility];  Service: Orthopedics;  Laterality: Left;    Family History  Problem Relation Age of Onset  Graves' disease Mother    Heart attack Father 78       COD   Heart failure Father    Kidney disease Father    Diabetes Brother    HIV Brother    Heart attack Maternal Grandfather    Heart attack Paternal Grandfather    Heart disease Brother 72       CABG    Social History   Socioeconomic History   Marital status: Married    Spouse name: Not on file   Number of children: Not on file   Years of education: Not on file   Highest education level: Not on file  Occupational History    Employer: SELF-EMPLOYED    Comment: Does not work outside the home  Tobacco Use   Smoking status: Former    Smokeless tobacco: Former    Quit date: 07/28/1992  Vaping Use   Vaping status: Never Used  Substance and Sexual Activity   Alcohol use: No   Drug use: No   Sexual activity: Not on file  Other Topics Concern   Not on file  Social History Narrative   HH of 3   No pets   Firearms locked away   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    Social Drivers of Health   Tobacco Use: Medium Risk (07/20/2024)   Patient History    Smoking Tobacco Use: Former    Smokeless Tobacco Use: Former    Passive Exposure: Not on Actuary Strain: Not on file  Food Insecurity: Not on file  Transportation Needs: Not on file  Physical Activity: Insufficiently Active (07/15/2023)   Exercise Vital Sign    Days of Exercise per Week: 3 days    Minutes of Exercise per Session: 40 min  Stress: No Stress Concern Present (07/15/2023)   [employer] of Occupational Health - Occupational Stress Questionnaire    Feeling of Stress : Not at all  Social Connections: Not on file  Depression (PHQ2-9): Low Risk (07/20/2024)   Depression (PHQ2-9)    PHQ-2 Score: 0  Alcohol Screen: Low Risk (07/15/2023)   Alcohol Screen    Last Alcohol Screening Score (AUDIT): 1  Housing: Not on file  Utilities: Not on file  Health Literacy: Not on file    Outpatient Medications Prior to Visit  Medication Sig Dispense Refill   aspirin 81 MG tablet Take 81 mg by mouth daily.     atorvastatin  (LIPITOR) 40 MG tablet Take 0.5 tablets (20 mg total) by mouth daily. 45 tablet 1   cetirizine (ZYRTEC) 10 MG tablet Take 10 mg by mouth daily.     colesevelam  (WELCHOL ) 625 MG tablet Take 2 tablets (1,250 mg total) by mouth daily. 180 tablet 3   Continuous Glucose Sensor (FREESTYLE LIBRE 3 PLUS SENSOR) MISC Change sensor every 14 (fourteen) days. 6 each 3   empagliflozin  (JARDIANCE ) 25 MG TABS tablet Take 1 tablet (25 mg total) by mouth  daily. 90 tablet 3   insulin  degludec (TRESIBA  FLEXTOUCH) 200 UNIT/ML FlexTouch Pen Inject 36 Units into the skin daily. 18 mL 3   Insulin  Pen Needle 32G X 4 MM MISC Use once daily. 100 each 3   metFORMIN  (GLUCOPHAGE -XR) 500 MG 24 hr tablet Take 4 tablets (2,000 mg total) by mouth daily with breakfast. 360 tablet 3   omeprazole  (PRILOSEC) 40 MG capsule  Take 1 capsule (40 mg total) by mouth daily before breakfast. 90 capsule 2   pioglitazone  (ACTOS ) 45 MG tablet Take 0.5 tablets (22.5 mg total) by mouth daily.     Saccharomyces boulardii (DAILY PROBIOTIC SUPPLEMENT PO) Take by mouth. 10 strain Probiotic (Members Oneil Adolphus)     tirzepatide  (MOUNJARO ) 10 MG/0.5ML Pen Inject 10 mg into the skin once a week. 6 mL 3   No facility-administered medications prior to visit.     EXAM:  BP (!) 120/90 (BP Location: Right Arm, Patient Position: Sitting, Cuff Size: Large)   Pulse (!) 105   Temp 97.8 F (36.6 C) (Oral)   Ht 5' 8.31 (1.735 m)   Wt 283 lb 3.2 oz (128.5 kg)   SpO2 94%   BMI 42.67 kg/m   Body mass index is 42.67 kg/m. Wt Readings from Last 3 Encounters:  07/20/24 283 lb 3.2 oz (128.5 kg)  07/05/24 287 lb 9.6 oz (130.5 kg)  03/21/24 282 lb (127.9 kg)    Physical Exam: Vital signs reviewed HZW:Uypd is a well-developed well-nourished alert cooperative    who appearsr stated age in no acute distress.  HEENT: normocephalic atraumatic , Eyes: PERRL EOM's full, conjunctiva clear, Nares: paten,t no deformity discharge or tenderness., Ears: no deformity EAC's clear TMs with normal landmarks. Mouth: clear OP, no lesions, edema.  Moist mucous membranes. Dentition in adequate repair. NECK: supple without masses, thyromegaly or bruits. CHEST/PULM:  Clear to auscultation and percussion breath sounds equal no wheeze , rales or rhonchi. No chest wall deformities or tenderness. CV: PMI is nondisplaced, S1 S2 no gallops, murmurs, rubs. Peripheral pulses are full without delay.No JVD .  ABDOMEN:  Bowel sounds normal nontender  No guard or rebound, no hepato splenomegal no CVA tenderness.   Extremtities:  No clubbing cyanosis or edema, no acute joint swelling or redness no focal atrophy NEURO:  Oriented x3, cranial nerves 3-12 appear to be intact, no obvious focal weakness,gait within normal limits no abnormal reflexes or asymmetrical SKIN: No acute rashes normal turgor, color, no bruising or petechiae. PSYCH: Oriented, good eye contact, no obvious depression anxiety, cognition and judgment appear normal. LN: no cervical axillary  adenopathy  Diabetic foot exam was performed with the following findings:   No deformities, ulcerations, or other skin breakdown Normal sensation of 10g monofilament Intact posterior tibialis and dorsalis pedis pulses       Lab Results  Component Value Date   WBC 8.9 07/16/2023   HGB 16.4 07/16/2023   HCT 50.0 07/16/2023   PLT 270.0 07/16/2023   GLUCOSE 131 (H) 07/05/2024   CHOL 108 07/05/2024   TRIG 96 07/05/2024   HDL 42 07/05/2024   LDLDIRECT 114.5 02/26/2007   LDLCALC 48 07/05/2024   ALT 20 07/05/2024   AST 14 07/05/2024   NA 139 07/05/2024   K 5.0 07/05/2024   CL 104 07/05/2024   CREATININE 0.96 07/05/2024   BUN 22 07/05/2024   CO2 25 07/05/2024   TSH 2.30 07/16/2023   PSA 0.62 07/16/2023   HGBA1C 6.9 (A) 07/05/2024   MICROALBUR 0.2 11/19/2023    BP Readings from Last 3 Encounters:  07/20/24 (!) 120/90  07/05/24 122/70  03/21/24 124/80    Lab results reviewed with patient  and update   Urine microalb, cbc psa  tsh   ASSESSMENT AND PLAN:  Discussed the following assessment and plan:    ICD-10-CM   1. Visit for preventive health examination  Z00.00 CBC with Differential/Platelet  TSH    PSA    PSA    TSH    CBC with Differential/Platelet    CANCELED: CBC with Differential/Platelet    CANCELED: PSA    CANCELED: TSH    2. Type 2 diabetes mellitus with hyperglycemia, without long-term current use of insulin  (HCC)   E11.65     3. Essential hypertension  I10 CBC with Differential/Platelet    CBC with Differential/Platelet    CANCELED: CBC with Differential/Platelet    4. Mixed hyperlipidemia  E78.2     5. Morbid obesity (HCC)  E66.01     Says bp is usually 120/78 range   controlled  Endocrine fu as indicated  Is utd on vaccines at this time   Adaptic resistance activity advised also .  Yearly check  as indicated  Return in about 1 year (around 07/20/2025) for depending on results.  Patient Care Team: Knight Oelkers, Apolinar POUR, MD as PCP - General Hilty, Vinie BROCKS, MD as PCP - Cardiology (Cardiology) Camillo Golas, MD (Ophthalmology) Alvia Norleen BIRCH, MD as Consulting Physician (Ophthalmology) Mona Vinie BROCKS, MD as Consulting Physician (Cardiology) Sheffield, Andrez SAUNDERS, PA-C (Inactive) as Physician Assistant (Dermatology) Trixie File, MD as Consulting Physician (Internal Medicine) There are no Patient Instructions on file for this visit.  Veena Sturgess K. Mushka Laconte M.D.  "

## 2024-07-21 LAB — CBC WITH DIFFERENTIAL/PLATELET
Basophils Absolute: 0.1 x10E3/uL (ref 0.0–0.2)
Basos: 1 %
EOS (ABSOLUTE): 0.4 x10E3/uL (ref 0.0–0.4)
Eos: 4 %
Hematocrit: 55.6 % — ABNORMAL HIGH (ref 37.5–51.0)
Hemoglobin: 16.8 g/dL (ref 13.0–17.7)
Immature Grans (Abs): 0.1 x10E3/uL (ref 0.0–0.1)
Immature Granulocytes: 1 %
Lymphocytes Absolute: 2.4 x10E3/uL (ref 0.7–3.1)
Lymphs: 26 %
MCH: 27.2 pg (ref 26.6–33.0)
MCHC: 30.2 g/dL — ABNORMAL LOW (ref 31.5–35.7)
MCV: 90 fL (ref 79–97)
Monocytes Absolute: 0.8 x10E3/uL (ref 0.1–0.9)
Monocytes: 8 %
Neutrophils Absolute: 5.7 x10E3/uL (ref 1.4–7.0)
Neutrophils: 60 %
Platelets: 287 x10E3/uL (ref 150–450)
RBC: 6.18 x10E6/uL — ABNORMAL HIGH (ref 4.14–5.80)
RDW: 13.6 % (ref 11.6–15.4)
WBC: 9.3 x10E3/uL (ref 3.4–10.8)

## 2024-07-21 LAB — TSH: TSH: 2.01 u[IU]/mL (ref 0.450–4.500)

## 2024-07-21 LAB — PSA: Prostate Specific Ag, Serum: 0.6 ng/mL (ref 0.0–4.0)

## 2024-07-22 ENCOUNTER — Other Ambulatory Visit: Payer: Self-pay | Admitting: Gastroenterology

## 2024-07-22 ENCOUNTER — Other Ambulatory Visit (HOSPITAL_BASED_OUTPATIENT_CLINIC_OR_DEPARTMENT_OTHER): Payer: Self-pay

## 2024-07-24 ENCOUNTER — Ambulatory Visit: Payer: Self-pay | Admitting: Internal Medicine

## 2024-07-24 DIAGNOSIS — R718 Other abnormality of red blood cells: Secondary | ICD-10-CM

## 2024-07-24 NOTE — Progress Notes (Signed)
 Mildly increase  RBC     could be hydration issue sometimes  from osa  and low oxygen level at night  but  this is the  only time noted  rest of blood count is ok   psa tsh all ok   I suggest (dont have to fast but hydrated state )cbc  diff in  3 months  to be sure all is stable and not significant   dx code increase  hematocrit

## 2024-07-26 ENCOUNTER — Other Ambulatory Visit (HOSPITAL_BASED_OUTPATIENT_CLINIC_OR_DEPARTMENT_OTHER): Payer: Self-pay

## 2024-07-26 ENCOUNTER — Other Ambulatory Visit: Payer: Self-pay | Admitting: Internal Medicine

## 2024-07-26 DIAGNOSIS — E1165 Type 2 diabetes mellitus with hyperglycemia: Secondary | ICD-10-CM

## 2024-07-26 NOTE — Telephone Encounter (Signed)
 I suggest  referral to  our pulmonary team that does OSA assessment  to see if worth getting a sleep test.  Since diabetics more likely to have sleep apnea esp if with obesity    also in addition to getting the  repeat cbc diff ,  Can do referral if patient agrees.

## 2024-08-02 ENCOUNTER — Other Ambulatory Visit (HOSPITAL_BASED_OUTPATIENT_CLINIC_OR_DEPARTMENT_OTHER): Payer: Self-pay

## 2024-08-03 ENCOUNTER — Other Ambulatory Visit (HOSPITAL_BASED_OUTPATIENT_CLINIC_OR_DEPARTMENT_OTHER): Payer: Self-pay

## 2024-08-04 ENCOUNTER — Other Ambulatory Visit (HOSPITAL_BASED_OUTPATIENT_CLINIC_OR_DEPARTMENT_OTHER): Payer: Self-pay

## 2024-08-08 ENCOUNTER — Other Ambulatory Visit (HOSPITAL_BASED_OUTPATIENT_CLINIC_OR_DEPARTMENT_OTHER): Payer: Self-pay

## 2024-08-09 ENCOUNTER — Other Ambulatory Visit (HOSPITAL_BASED_OUTPATIENT_CLINIC_OR_DEPARTMENT_OTHER): Payer: Self-pay

## 2024-08-09 ENCOUNTER — Other Ambulatory Visit: Payer: Self-pay

## 2024-08-10 NOTE — Progress Notes (Signed)
 "   08/11/2024 Alfred Johnson [id number] 19-Jun-1971  Gastroenterology Office Note     Primary Care Physician:  Charlett Apolinar POUR, MD  Primary GI Provider: Celestia Rima, NP; Jinny Carmine, MD    Chief Complaint   Chief Complaint  Patient presents with   New Patient (Initial Visit)    Prilosec has helped and would like refill-     History of Present Illness   Alfred Johnson is a 54 y.o. male with PMHX of diabetes, obesity presenting today for 1 year follow-up medication refill.    Discussed the use of AI scribe software for clinical note transcription with the patient, who gave verbal consent to proceed.  Symptoms of acid reflux and sulfur burps have significantly improved since his last visit one year ago. Omeprazole  40 mg once daily in the morning is effective in controlling symptoms. He continues to experience occasional episodes, approximately once every three months, typically triggered by tomato-based foods such as spaghetti and pizza. Soda intake is limited to one per day, though this varies.  NSAIDs are used only rarely as needed. Denies nausea, vomiting, or loose diarrhea. Bowel movements are very soft but not loose, occurring at least once daily, usually twice daily. Increased oatmeal intake has helped him tolerate more acidic foods later in the day.  Patient seen by Dr. Unk on 08/25/2023 for sulfur burps and increased gas on Mounjaro . Continued on omeprazole . Recommended gastric emptying study if symptoms worsen.  10/02/2021 Colonoscopy - The examined portion of the ileum was normal. - Two 3 to 4 mm polyps in the descending colon, removed with a cold snare. Resected and retrieved. - Moderate diverticulosis in the recto-sigmoid colon, in the sigmoid colon and in the descending colon. There was no evidence of diverticular bleeding. - Non-bleeding external hemorrhoids - repeat 7-10 years  10/02/2021 EGD - Normal duodenal bulb and second portion of the duodenum.   - Small hiatal hernia. - Normal stomach.  - Normal gastroesophageal junction and esophagus. - No specimens collected.   Past Medical History:  Diagnosis Date   Acute medial meniscus tear of right knee 01/05/2018   ANEMIA DUE TO CHRONIC BLOOD LOSS 09/10/2009   Cough 07/09/2010   Qualifier: Diagnosis of   By: Kassie MD, Sean A  HAD CHANGED FROM ACE      Feels like could be from  Cozaar also but not sure has allergy type sx last c xray 2011.     Diabetes mellitus    Elevated BP    GERD (gastroesophageal reflux disease)    HYPERLIPIDEMIA, MIXED 03/23/2007   Hypertension    KIDNEY STONE 02/05/2009   Left lower lobe pneumonia 02/08/2014   VENTRICULAR FUNCTION, DECREASED 06/21/2007    Past Surgical History:  Procedure Laterality Date   COLONOSCOPY WITH PROPOFOL  N/A 10/02/2021   Procedure: COLONOSCOPY WITH PROPOFOL ;  Surgeon: Unk Corinn Skiff, MD;  Location: [facility] ENDOSCOPY;  Service: Gastroenterology;  Laterality: N/A;   ESOPHAGOGASTRODUODENOSCOPY N/A 10/02/2021   Procedure: ESOPHAGOGASTRODUODENOSCOPY (EGD);  Surgeon: Unk Corinn Skiff, MD;  Location: [facility] END[facility]COPY;  Service: Gastroenterology;  Laterality: N/A;   KNEE ARTHROSCOPY WITH MEDIAL MENISECTOMY Left 07/09/2022   Procedure: LEFT KNEE ARTHROSCOPY, DEBRIDEMENT AND PARTIAL MEDIAL MENISCECTOMY;  Surgeon: Jerri Kay HERO, MD;  Location: [facility];  Service: Orthopedics;  Laterality: Left;    Current Outpatient Medications  Medication Sig Dispense Refill   aspirin 81 MG tablet Take 81 mg by mouth daily.     atorvastatin  (LIPITOR) 40 MG tablet Take  0.5 tablets (20 mg total) by mouth daily. 45 tablet 1   cetirizine (ZYRTEC) 10 MG tablet Take 10 mg by mouth daily.     colesevelam  (WELCHOL ) 625 MG tablet Take 2 tablets (1,250 mg total) by mouth daily. 180 tablet 3   Continuous Glucose Sensor (FREESTYLE LIBRE 3 PLUS SENSOR) MISC Change sensor every 14 (fourteen) days. 6 each 3   [employer] SUNSCREEN SPF 30 EX apply topically  to face and body daily; Duration: 30     empagliflozin  (JARDIANCE ) 25 MG TABS tablet Take 1 tablet (25 mg total) by mouth daily. 90 tablet 3   insulin  degludec (TRESIBA  FLEXTOUCH) 200 UNIT/ML FlexTouch Pen Inject 36 Units into the skin daily. 18 mL 3   insulin  degludec (TRESIBA  FLEXTOUCH) 200 UNIT/ML FlexTouch Pen Subcutaneous; Duration: 83 Days     Insulin  Pen Needle 32G X 4 MM MISC Use once daily. 100 each 3   metFORMIN  (GLUCOPHAGE -XR) 500 MG 24 hr tablet Take 4 tablets (2,000 mg total) by mouth daily with breakfast. 360 tablet 3   omeprazole  (PRILOSEC) 40 MG capsule Take 1 capsule (40 mg total) by mouth daily before breakfast. 90 capsule 2   pioglitazone  (ACTOS ) 45 MG tablet Take 0.5 tablets (22.5 mg total) by mouth daily.     Saccharomyces boulardii (DAILY PROBIOTIC SUPPLEMENT PO) Take by mouth. 10 strain Probiotic (Members Oneil Adolphus)     tirzepatide  (MOUNJARO ) 10 MG/0.5ML Pen Inject 10 mg into the skin once a week. 6 mL 3   No current facility-administered medications for this visit.    Allergies as of 08/11/2024   (No Known Allergies)    Family History  Problem Relation Age of Onset   Graves' disease Mother    Heart attack Father 87       COD   Heart failure Father    Kidney disease Father    Diabetes Brother    HIV Brother    Heart attack Maternal Grandfather    Heart attack Paternal Grandfather    Heart disease Brother 26       CABG    Social History   Socioeconomic History   Marital status: Married    Spouse name: Not on file   Number of children: Not on file   Years of education: Not on file   Highest education level: Not on file  Occupational History    Employer: SELF-EMPLOYED    Comment: Does not work outside the home  Tobacco Use   Smoking status: Former   Smokeless tobacco: Former    Quit date: 07/28/1992  Vaping Use   Vaping status: Never Used  Substance and Sexual Activity   Alcohol use: No   Drug use: No   Sexual activity: Not on file  Other  Topics Concern   Not on file  Social History Narrative   HH of 3   No pets   Firearms locked away   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    Social Drivers of Health   Tobacco Use: Medium Risk (08/11/2024)   Patient History    Smoking Tobacco Use: Former    Smokeless Tobacco Use: Former    Passive Exposure: Not on Actuary Strain: Not on file  Food Insecurity: Not on file  Transportation Needs: Not on file  Physical Activity: Insufficiently Active (07/15/2023)   Exercise Vital Sign  Days of Exercise per Week: 3 days    Minutes of Exercise per Session: 40 min  Stress: No Stress Concern Present (07/15/2023)   [employer] of Occupational Health - Occupational Stress Questionnaire    Feeling of Stress : Not at all  Social Connections: Not on file  Intimate Partner Violence: Not on file  Depression (PHQ2-9): Low Risk (07/20/2024)   Depression (PHQ2-9)    PHQ-2 Score: 0  Alcohol Screen: Low Risk (07/15/2023)   Alcohol Screen    Last Alcohol Screening Score (AUDIT): 1  Housing: Not on file  Utilities: Not on file  Health Literacy: Not on file     RELEVANT GI HISTORY, IMAGING AND LABS: CBC    Component Value Date/Time   WBC 9.3 07/20/2024 1120   WBC 8.9 07/16/2023 0840   RBC 6.18 (H) 07/20/2024 1120   RBC 5.63 07/16/2023 0840   HGB 16.8 07/20/2024 1120   HCT 55.6 (H) 07/20/2024 1120   PLT 287 07/20/2024 1120   MCV 90 07/20/2024 1120   MCH 27.2 07/20/2024 1120   MCHC 30.2 (L) 07/20/2024 1120   MCHC 32.9 07/16/2023 0840   RDW 13.6 07/20/2024 1120   LYMPHSABS 2.4 07/20/2024 1120   MONOABS 1.4 (H) 07/16/2023 0840   EOSABS 0.4 07/20/2024 1120   BASOSABS 0.1 07/20/2024 1120   Recent Labs    07/20/24 1120  HGB 16.8    CMP     Component Value Date/Time   NA 139 07/05/2024 1029   K 5.0 07/05/2024 1029   CL 104 07/05/2024 1029   CO2 25 07/05/2024 1029    GLUCOSE 131 (H) 07/05/2024 1029   BUN 22 07/05/2024 1029   CREATININE 0.96 07/05/2024 1029   CALCIUM  9.8 07/05/2024 1029   PROT 7.3 07/05/2024 1029   ALBUMIN 4.7 07/16/2023 0840   AST 14 07/05/2024 1029   ALT 20 07/05/2024 1029   ALKPHOS 73 07/16/2023 0840   BILITOT 0.4 07/05/2024 1029   GFRNONAA >60 07/09/2022 1338   GFRAA 140 02/26/2007 1029      Latest Ref Rng & Units 07/05/2024   10:29 AM 07/16/2023    8:40 AM 06/04/2022   11:55 AM  Hepatic Function  Total Protein 6.1 - 8.1 g/dL 7.3  7.2  6.9   Albumin 3.5 - 5.2 g/dL  4.7  4.5   AST 10 - 35 U/L 14  22  15    ALT 9 - 46 U/L 20  32  19   Alk Phosphatase 39 - 117 U/L  73  72   Total Bilirubin 0.2 - 1.2 mg/dL 0.4  0.6  0.4   Bilirubin, Direct 0.0 - 0.3 mg/dL   0.1       Review of Systems   All systems reviewed and negative except where noted in HPI.    Physical Exam  BP 112/74   Pulse 88   Temp (!) 97.5 F (36.4 C)   Ht 5' 9 (1.753 m)   Wt 283 lb 9.6 oz (128.6 kg)   SpO2 93%   BMI 41.88 kg/m  No LMP for male patient. General:   Alert and oriented. Pleasant and cooperative. Well-nourished and well-developed. NAD. Head:  Normocephalic and atraumatic. Eyes:  Without icterus Ears:  Normal auditory acuity. Lungs:  Respirations even and unlabored.  Clear throughout to auscultation.   No wheezes, crackles, or rhonchi. No acute distress. Heart:  Regular rate and rhythm; no murmurs, clicks, rubs, or gallops. Abdomen:  Normal bowel sounds.  No bruits.  Soft, non-tender and non-distended without masses, hepatosplenomegaly or hernias noted.  No guarding or rebound tenderness.   Rectal:  Deferred. Msk:  Symmetrical without gross deformities. Normal posture. Extremities:  Without edema. Neurologic:  Alert and  oriented x4;  grossly normal neurologically. Skin:  Intact without significant lesions or rashes. Psych:  Alert and cooperative. Normal mood and affect.   Assessment & Plan   Alfred Johnson is a 54 y.o. male  presenting today for annual follow-up and refill on omeprazole .  GERD.  Well-controlled, rare breakthrough triggered by acidic foods. - Discussed continuation of lifestyle modifications for GERD. - Discussed weight loss - Continue on omeprazole  40 mg once daily.  Refill sent to pharmacy.   Follow-up in 1 year or sooner if needed   Grayce Bohr, DNP, AGNP-C [facility]   "

## 2024-08-11 ENCOUNTER — Other Ambulatory Visit (HOSPITAL_BASED_OUTPATIENT_CLINIC_OR_DEPARTMENT_OTHER): Payer: Self-pay

## 2024-08-11 ENCOUNTER — Encounter: Payer: Self-pay | Admitting: Family Medicine

## 2024-08-11 ENCOUNTER — Ambulatory Visit: Admitting: Family Medicine

## 2024-08-11 VITALS — BP 112/74 | HR 88 | Temp 97.5°F | Ht 69.0 in | Wt 283.6 lb

## 2024-08-11 DIAGNOSIS — K219 Gastro-esophageal reflux disease without esophagitis: Secondary | ICD-10-CM | POA: Diagnosis not present

## 2024-08-11 MED ORDER — OMEPRAZOLE 40 MG PO CPDR
40.0000 mg | DELAYED_RELEASE_CAPSULE | Freq: Every day | ORAL | 3 refills | Status: AC
  Filled 2024-08-11: qty 90, 90d supply, fill #0

## 2024-08-20 ENCOUNTER — Other Ambulatory Visit (HOSPITAL_BASED_OUTPATIENT_CLINIC_OR_DEPARTMENT_OTHER): Payer: Self-pay

## 2024-08-22 ENCOUNTER — Other Ambulatory Visit (HOSPITAL_COMMUNITY): Payer: Self-pay

## 2024-08-22 ENCOUNTER — Telehealth: Payer: Self-pay | Admitting: Pharmacy Technician

## 2024-08-22 NOTE — Telephone Encounter (Signed)
 Pharmacy Patient Advocate Encounter   Received notification from Onbase CMM KEY that prior authorization for Mounjaro  10MG /0.5ML auto-injectors  is required/requested.   Insurance verification completed.   The patient is insured through [employer].   Per test claim: PA required; PA submitted to above mentioned insurance via Latent Key/confirmation #/EOC [id number] Status is pending

## 2024-08-26 ENCOUNTER — Ambulatory Visit: Payer: Self-pay

## 2024-08-26 ENCOUNTER — Encounter: Payer: Self-pay | Admitting: Family Medicine

## 2024-08-26 ENCOUNTER — Other Ambulatory Visit (HOSPITAL_COMMUNITY): Payer: Self-pay

## 2024-08-26 ENCOUNTER — Other Ambulatory Visit (HOSPITAL_BASED_OUTPATIENT_CLINIC_OR_DEPARTMENT_OTHER): Payer: Self-pay

## 2024-08-26 ENCOUNTER — Ambulatory Visit (INDEPENDENT_AMBULATORY_CARE_PROVIDER_SITE_OTHER): Admitting: Family Medicine

## 2024-08-26 VITALS — BP 106/76 | HR 98 | Temp 98.2°F | Wt 280.1 lb

## 2024-08-26 DIAGNOSIS — M62830 Muscle spasm of back: Secondary | ICD-10-CM | POA: Diagnosis not present

## 2024-08-26 MED ORDER — METHOCARBAMOL 500 MG PO TABS
500.0000 mg | ORAL_TABLET | Freq: Four times a day (QID) | ORAL | 0 refills | Status: DC | PRN
  Filled 2024-08-26: qty 30, 8d supply, fill #0

## 2024-08-26 NOTE — Telephone Encounter (Signed)
 Pharmacy Patient Advocate Encounter  Received notification from [employer] that Prior Authorization for Mounjaro  10MG /0.5ML auto-injectors  has been APPROVED from 08/22/24 to 08/26/25. Unable to obtain price due to refill too soon rejection, last fill date 07/18/24 next available fill date 09/28/24   PA #/Case ID/Reference #: [id number]

## 2024-08-26 NOTE — Telephone Encounter (Signed)
 FYI Only or Action Required?: FYI only for provider: appointment scheduled on 08/26/24.  Patient was last seen in primary care on 07/20/2024 by Panosh, Apolinar POUR, MD.  Called Nurse Triage reporting Back Pain.  Symptoms began a week ago.  Interventions attempted: OTC medications: aleve and TENS.  Symptoms are: gradually worsening.  Triage Disposition: See PCP When Office is Open (Within 3 Days)  Patient/caregiver understands and will follow disposition?: Yes  Reason for Disposition  [1] MODERATE back pain (e.g., interferes with normal activities) AND [2] present > 3 days  Answer Assessment - Initial Assessment Questions Pt reports he strained his back last week after lifting a tool box. Pain in bilateral lower back. Described as tight with periods of 7/10 pain. Has been taking aleve PRN and using TENS.   1. ONSET: When did the pain begin? (e.g., minutes, hours, days)     One week 2. LOCATION: Where does it hurt? (upper, mid or lower back)     Bilateral lower back 3. SEVERITY: How bad is the pain?  (e.g., Scale 1-10; mild, moderate, or severe)     7/10 4. PATTERN: Is the pain constant? (e.g., yes, no; constant, intermittent)      Constant dull, periods of stronger 7/10 5. RADIATION: Does the pain shoot into your legs or somewhere else?     Denies 6. CAUSE:  What do you think is causing the back pain?      Lifting/twisting 7. BACK OVERUSE:  Any recent lifting of heavy objects, strenuous work or exercise?     Lifting/twisting 8. MEDICINES: What have you taken so far for the pain? (e.g., nothing, acetaminophen , NSAIDS)     Aleve 9. NEUROLOGIC SYMPTOMS: Do you have any weakness, numbness, or problems with bowel/bladder control?     Denies 10. OTHER SYMPTOMS: Do you have any other symptoms? (e.g., fever, abdomen pain, burning with urination, blood in urine)       Denies  Protocols used: Back Pain-A-AH  Message from Eustis D sent at 08/26/2024  8:50 AM  EST  Reason for Triage: Pt thinks he Strained his back, pt is having severe pain in the lower back on the right and left side. Hurts worse when standing up in one place.

## 2024-08-26 NOTE — Progress Notes (Signed)
 "  Established Patient Office Visit  Subjective   Patient ID: Alfred Johnson, male    DOB: [birth year]  Age: 54 y.o. MRN: [id number]  Chief Complaint  Patient presents with   Back Pain    HPI    Alfred Johnson is seen as a work in today with lower thoracic back pain for about 1 week duration.  He recalls bending over to pick up a toolbox and feels like the twisting motion of that was what led to his pain.  This was about a week ago.  He has used some over-the-counter Aleve without much improvement.  Feels like his muscles are tightening up frequently.  Has tried heat, stretches, and TENS unit without any real improvement.  Past Medical History:  Diagnosis Date   Acute medial meniscus tear of right knee 01/05/2018   ANEMIA DUE TO CHRONIC BLOOD LOSS 09/10/2009   Cough 07/09/2010   Qualifier: Diagnosis of   By: Kassie MD, Sean A  HAD CHANGED FROM ACE      Feels like could be from  Cozaar also but not sure has allergy type sx last c xray 2011.     Diabetes mellitus    Elevated BP    GERD (gastroesophageal reflux disease)    HYPERLIPIDEMIA, MIXED 03/23/2007   Hypertension    KIDNEY STONE 02/05/2009   Left lower lobe pneumonia 02/08/2014   VENTRICULAR FUNCTION, DECREASED 06/21/2007   Past Surgical History:  Procedure Laterality Date   COLONOSCOPY WITH PROPOFOL  N/A 10/02/2021   Procedure: COLONOSCOPY WITH PROPOFOL ;  Surgeon: Unk Corinn Skiff, MD;  Location[facility] ENDOSCOPY;  Service: Gastroenterology;  Laterality: N/A;   ESOPHAGOGASTRODUODENOSCOPY N/A 10/02/2021   Procedure: ESOPHAGOGASTRODUODENOSCOPY (EGD);  Surgeon: Unk Corinn Skiff, MD;  Location: [facility] ENDOSCOPY;  Service: Gastroenterology;  Laterality: N/A;   KNEE ARTHROSCOPY WITH MEDIAL MENISECTOMY Left 07/09/2022   Procedure: LEFT KNEE ARTHROSCOPY, DEBRIDEMENT AND PARTIAL MEDIAL MENISCECTOMY;  Surgeon: Jerri Kay HERO, MD;  Location: [facility];  Service: Orthopedics;  Laterality: Left;    reports that he has quit  smoking. He quit smokeless tobacco use about 32 years ago. He reports that he does not drink alcohol and does not use drugs. family history includes Diabetes in his brother; Yvone' disease in his mother; HIV in his brother; Heart attack in his maternal grandfather and paternal grandfather; Heart attack (age of onset: 51) in his father; Heart disease (age of onset: 72) in his brother; Heart failure in his father; Kidney disease in his father. Allergies[1]  Review of Systems  Constitutional:  Negative for chills and fever.  Genitourinary:  Negative for dysuria.  Musculoskeletal:  Positive for back pain.  Neurological:  Negative for sensory change and focal weakness.      Objective:     BP 106/76   Pulse 98   Temp 98.2 F (36.8 C) (Oral)   Wt 280 lb 1.6 oz (127.1 kg)   SpO2 96%   BMI 41.36 kg/m  BP Readings from Last 3 Encounters:  08/26/24 106/76  08/11/24 112/74  07/20/24 120/78   Wt Readings from Last 3 Encounters:  08/26/24 280 lb 1.6 oz (127.1 kg)  08/11/24 283 lb 9.6 oz (128.6 kg)  07/20/24 283 lb 3.2 oz (128.5 kg)      Physical Exam Vitals reviewed.  Constitutional:      General: He is not in acute distress.    Appearance: He is not ill-appearing.  Cardiovascular:     Rate and Rhythm: Normal rate and regular rhythm.  Musculoskeletal:     Comments: Straight leg raises are negative bilaterally.  No spinal tenderness.  He has some lower bilateral thoracic muscular tenderness which is mild.  Palpable muscle tension lower thoracic muscles.  Neurological:     Mental Status: He is alert.     Comments: No focal weakness involving lower extremities.      No results found for any visits on 08/26/24.    The ASCVD Risk score (Arnett DK, et al., 2019) failed to calculate for the following reasons:   The valid total cholesterol range is 130 to 320 mg/dL    Assessment & Plan:   Lower thoracic muscle strain/spasm.  Continue heat and stretches.  Trial of Robaxin  500  mg every 6-8 hours as needed.  Caution about potential for sedation and dizziness with this.  [name], MD     [1] No Known Allergies  "

## 2024-09-02 ENCOUNTER — Other Ambulatory Visit: Payer: Self-pay | Admitting: Family Medicine

## 2024-09-02 ENCOUNTER — Other Ambulatory Visit (HOSPITAL_BASED_OUTPATIENT_CLINIC_OR_DEPARTMENT_OTHER): Payer: Self-pay

## 2024-09-02 ENCOUNTER — Telehealth: Payer: Self-pay

## 2024-09-02 MED ORDER — METHOCARBAMOL 500 MG PO TABS
500.0000 mg | ORAL_TABLET | Freq: Four times a day (QID) | ORAL | 0 refills | Status: AC | PRN
  Filled 2024-09-02: qty 30, 8d supply, fill #0

## 2024-09-02 NOTE — Addendum Note (Signed)
 Addended by: Sonnia Strong on: 09/02/2024 01:29 PM   Modules accepted: Orders

## 2024-09-02 NOTE — Telephone Encounter (Signed)
Rx sent. Pt is aware.  °

## 2024-09-02 NOTE — Telephone Encounter (Signed)
 Copied from CRM #[id number]. Topic: Clinical - Medication Question >> Sep 02, 2024  8:14 AM Robinson H wrote: Reason for CRM: Patient following up on refill request for the methocarbamol  (ROBAXIN ) 500 MG tablet that was prescribed by Dr. Micheal, patient states that the muscle relaxer have been effective and would like 1 more week of pills if possible and then he'll be done with them for good and should be fine.  [place] [phone]

## 2024-11-03 ENCOUNTER — Ambulatory Visit: Admitting: Internal Medicine

## 2024-11-03 ENCOUNTER — Other Ambulatory Visit
